# Patient Record
Sex: Female | Born: 1960 | Race: White | Hispanic: No | Smoking: Never smoker
Health system: Southern US, Community
[De-identification: ages and names within clinical notes are randomized; demographics above are authoritative.]

## PROBLEM LIST (undated history)

## (undated) DIAGNOSIS — Z800 Family history of malignant neoplasm of digestive organs: Secondary | ICD-10-CM

## (undated) DIAGNOSIS — K5732 Diverticulitis of large intestine without perforation or abscess without bleeding: Secondary | ICD-10-CM

## (undated) DIAGNOSIS — Z803 Family history of malignant neoplasm of breast: Secondary | ICD-10-CM

## (undated) DIAGNOSIS — Z808 Family history of malignant neoplasm of other organs or systems: Secondary | ICD-10-CM

## (undated) DIAGNOSIS — T7840XA Allergy, unspecified, initial encounter: Secondary | ICD-10-CM

## (undated) DIAGNOSIS — I10 Essential (primary) hypertension: Secondary | ICD-10-CM

## (undated) DIAGNOSIS — E785 Hyperlipidemia, unspecified: Secondary | ICD-10-CM

## (undated) HISTORY — DX: Hyperlipidemia, unspecified: E78.5

## (undated) HISTORY — DX: Family history of malignant neoplasm of digestive organs: Z80.0

## (undated) HISTORY — DX: Family history of malignant neoplasm of other organs or systems: Z80.8

## (undated) HISTORY — DX: Allergy, unspecified, initial encounter: T78.40XA

## (undated) HISTORY — DX: Family history of malignant neoplasm of breast: Z80.3

## (undated) HISTORY — PX: BREAST SURGERY: SHX581

## (undated) HISTORY — DX: Essential (primary) hypertension: I10

---
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
## 1998-04-26 HISTORY — PX: BREAST SURGERY: SHX581

## 1999-04-27 HISTORY — PX: BREAST BIOPSY: SHX20

## 1999-04-27 HISTORY — PX: BREAST CYST ASPIRATION: SHX578

## 2003-04-27 HISTORY — PX: ABDOMINAL HYSTERECTOMY: SHX81

## 2004-09-03 ENCOUNTER — Ambulatory Visit: Payer: Self-pay | Admitting: Unknown Physician Specialty

## 2005-10-11 ENCOUNTER — Ambulatory Visit: Payer: Self-pay | Admitting: Unknown Physician Specialty

## 2006-10-26 ENCOUNTER — Ambulatory Visit: Payer: Self-pay | Admitting: Unknown Physician Specialty

## 2007-11-13 ENCOUNTER — Ambulatory Visit: Payer: Self-pay | Admitting: Unknown Physician Specialty

## 2008-11-18 ENCOUNTER — Ambulatory Visit: Payer: Self-pay | Admitting: Unknown Physician Specialty

## 2009-02-10 ENCOUNTER — Ambulatory Visit: Payer: Self-pay | Admitting: Internal Medicine

## 2009-11-19 ENCOUNTER — Ambulatory Visit: Payer: Self-pay | Admitting: Unknown Physician Specialty

## 2010-12-07 ENCOUNTER — Ambulatory Visit: Payer: Self-pay | Admitting: Unknown Physician Specialty

## 2011-03-16 ENCOUNTER — Encounter: Payer: Self-pay | Admitting: Internal Medicine

## 2011-03-16 ENCOUNTER — Ambulatory Visit (INDEPENDENT_AMBULATORY_CARE_PROVIDER_SITE_OTHER): Payer: BC Managed Care – PPO | Admitting: Internal Medicine

## 2011-03-16 VITALS — BP 118/72 | HR 87 | Temp 98.5°F | Resp 16 | Ht 62.75 in | Wt 180.5 lb

## 2011-03-16 DIAGNOSIS — R0602 Shortness of breath: Secondary | ICD-10-CM

## 2011-03-16 DIAGNOSIS — J189 Pneumonia, unspecified organism: Secondary | ICD-10-CM

## 2011-03-16 DIAGNOSIS — R05 Cough: Secondary | ICD-10-CM

## 2011-03-16 DIAGNOSIS — R5383 Other fatigue: Secondary | ICD-10-CM

## 2011-03-16 MED ORDER — GUAIFENESIN-CODEINE 100-10 MG/5ML PO SYRP: 5.0000 mL | ORAL_SOLUTION | Freq: Four times a day (QID) | ORAL | Status: DC | PRN

## 2011-03-16 MED ORDER — LEVOFLOXACIN 750 MG PO TABS: 750.0000 mg | ORAL_TABLET | Freq: Every day | ORAL | Status: AC

## 2011-03-16 NOTE — Progress Notes (Signed)
Subjective:    Patient ID: Nichole Jensen, female    DOB: [birth year], 50 y.o.   MRN: [id number]  HPI 50YO female with HTN presents for acute visit complaining of 1 month history of cough and nasal congestion presents for acute visit. Reports initially had fever, chills, malaise, nasal congestion. Developed progressively worsening cough, productive of green sputum. Has been using OTC cough/cold medications and left over codeine from episode of bronchitis last year with no improvement. Reports some chest tightness and mild dyspnea. No wheezing. No recurrent fever, chills. Complains of right ear pain.  Outpatient Encounter Prescriptions as of 03/16/2011  Medication Sig Dispense Refill  . albuterol (PROAIR HFA) 108 (90 BASE) MCG/ACT inhaler Inhale 1 puff into the lungs every 4 (four) hours as needed.        Marland Kitchen aspirin 81 MG tablet Take 81 mg by mouth daily.        Marland Kitchen b complex vitamins tablet Take 1 tablet by mouth daily.        Marland Kitchen guaiFENesin-codeine (ROBITUSSIN AC) 100-10 MG/5ML syrup Take 5 mLs by mouth 4 (four) times daily as needed.  240 mL  0  . Misc Natural Products (OSTEO BI-FLEX JOINT SHIELD) TABS Take 1 each by mouth daily.        . Multiple Vitamin (MULTIVITAMIN) tablet Take 1 tablet by mouth daily.        Marland Kitchen DISCONTD: guaiFENesin-codeine (ROBITUSSIN AC) 100-10 MG/5ML syrup Take 5 mLs by mouth 3 (three) times daily as needed.        Marland Kitchen levofloxacin (LEVAQUIN) 750 MG tablet Take 1 tablet (750 mg total) by mouth daily.  7 tablet  0     Review of Systems  Constitutional: Positive for fever and chills. Negative for unexpected weight change.  HENT: Positive for congestion, sore throat, rhinorrhea, postnasal drip and sinus pressure. Negative for hearing loss, ear pain, nosebleeds, facial swelling, sneezing, mouth sores, trouble swallowing, neck pain, neck stiffness, voice change, tinnitus and ear discharge.   Eyes: Negative for pain, discharge, redness and visual disturbance.  Respiratory:  Positive for cough, chest tightness and shortness of breath. Negative for wheezing and stridor.   Cardiovascular: Negative for chest pain, palpitations and leg swelling.  Musculoskeletal: Negative for myalgias and arthralgias.  Skin: Negative for color change and rash.  Neurological: Negative for dizziness, weakness, light-headedness and headaches.  Hematological: Negative for adenopathy.   BP 118/72  Pulse 87  Temp(Src) 98.5 F (36.9 C) (Oral)  Resp 16  Ht 5' 2.75" (1.594 m)  Wt 180 lb 8 oz (81.874 kg)  BMI 32.23 kg/m2  SpO2 97%     Objective:   Physical Exam  Constitutional: She is oriented to person, place, and time. She appears well-developed and well-nourished. No distress.  HENT:  Head: Normocephalic and atraumatic.  Right Ear: External ear normal.  Left Ear: External ear normal.  Nose: Nose normal.  Mouth/Throat: Oropharynx is clear and moist. No oropharyngeal exudate.  Eyes: Conjunctivae are normal. Pupils are equal, round, and reactive to light. Right eye exhibits no discharge. Left eye exhibits no discharge. No scleral icterus.  Neck: Normal range of motion. Neck supple. No tracheal deviation present. No thyromegaly present.  Cardiovascular: Normal rate, regular rhythm, normal heart sounds and intact distal pulses.  Exam reveals no gallop and no friction rub.   No murmur heard. Pulmonary/Chest: Effort normal. No accessory muscle usage. Not tachypneic. No respiratory distress. She has decreased breath sounds in the right middle field and the right lower  field. She has no wheezes. She has rhonchi in the right middle field and the right lower field. She has no rales. She exhibits no tenderness.  Musculoskeletal: Normal range of motion. She exhibits no edema and no tenderness.  Lymphadenopathy:    She has no cervical adenopathy.  Neurological: She is alert and oriented to person, place, and time. No cranial nerve deficit. She exhibits normal muscle tone. Coordination normal.    Skin: Skin is warm and dry. No rash noted. She is not diaphoretic. No erythema. No pallor.  Psychiatric: She has a normal mood and affect. Her behavior is normal. Judgment and thought content normal.          Assessment & Plan:  1. Pneumonia - Symptoms and exam consistent with RLL pneumonia. Will treat with Levaquin and use codeine for cough. Pt will call if symptoms not improving over next 72hr. If no improvement, will get CXR.

## 2011-04-09 LAB — HM PAP SMEAR: HM Pap smear: NORMAL

## 2011-04-22 ENCOUNTER — Ambulatory Visit (INDEPENDENT_AMBULATORY_CARE_PROVIDER_SITE_OTHER): Payer: BC Managed Care – PPO | Admitting: Internal Medicine

## 2011-04-22 ENCOUNTER — Encounter: Payer: Self-pay | Admitting: Internal Medicine

## 2011-04-22 VITALS — BP 138/80 | HR 68 | Temp 97.7°F | Wt 182.0 lb

## 2011-04-22 DIAGNOSIS — J302 Other seasonal allergic rhinitis: Secondary | ICD-10-CM

## 2011-04-22 DIAGNOSIS — J309 Allergic rhinitis, unspecified: Secondary | ICD-10-CM

## 2011-04-22 DIAGNOSIS — I10 Essential (primary) hypertension: Secondary | ICD-10-CM | POA: Insufficient documentation

## 2011-04-22 NOTE — Progress Notes (Signed)
Subjective:    Patient ID: Nichole Jensen, female    DOB: 1960-11-01, 50 y.o.   MRN: [id number]  HPI 50 year old female with a history of hypertension and a recent episode of pneumonia presents for followup. She reports that it took several weeks for her to recover from her pneumonia. She reports some fatigue which was persistent for several weeks. She notes that her symptoms have markedly improved, however. She reports that she no longer has cough, shortness of breath, or fever. She does occasionally have clear nasal drainage, sneezing, and watery eyes which are consistent with her known history of seasonal allergies. She has been using Benadryl for this at night and Zyrtec during the day.  In regards to her history of hypertension, she is not currently taking any medications. She did not bring a record of her blood pressure today. She denies any headache, chest pain, palpitations, or other symptoms.  Outpatient Encounter Prescriptions as of 04/22/2011  Medication Sig Dispense Refill  . aspirin 81 MG tablet Take 81 mg by mouth daily.        Marland Kitchen b complex vitamins tablet Take 1 tablet by mouth daily.        . Misc Natural Products (OSTEO BI-FLEX JOINT SHIELD) TABS Take 1 each by mouth daily.        . Multiple Vitamin (MULTIVITAMIN) tablet Take 1 tablet by mouth daily.        Marland Kitchen DISCONTD: guaiFENesin-codeine (ROBITUSSIN AC) 100-10 MG/5ML syrup Take 5 mLs by mouth 4 (four) times daily as needed.  240 mL  0    Review of Systems  Constitutional: Negative for fever, chills, appetite change, fatigue and unexpected weight change.  HENT: Positive for congestion, rhinorrhea, sneezing, voice change and postnasal drip. Negative for ear pain, sore throat, trouble swallowing, neck pain and sinus pressure.   Eyes: Negative for visual disturbance.  Respiratory: Negative for cough, shortness of breath, wheezing and stridor.   Cardiovascular: Negative for chest pain, palpitations and leg swelling.    Gastrointestinal: Negative for nausea, vomiting, abdominal pain, diarrhea, constipation, blood in stool, abdominal distention and anal bleeding.  Genitourinary: Negative for dysuria and flank pain.  Musculoskeletal: Negative for myalgias, arthralgias and gait problem.  Skin: Negative for color change and rash.  Neurological: Negative for dizziness and headaches.  Hematological: Negative for adenopathy. Does not bruise/bleed easily.  Psychiatric/Behavioral: Negative for suicidal ideas, sleep disturbance and dysphoric mood. The patient is not nervous/anxious.    BP 138/80  Pulse 68  Temp(Src) 97.7 F (36.5 C) (Oral)  Wt 182 lb (82.555 kg)  SpO2 97%     Objective:   Physical Exam  Constitutional: She is oriented to person, place, and time. She appears well-developed and well-nourished. No distress.  HENT:  Head: Normocephalic and atraumatic.  Right Ear: External ear normal.  Left Ear: External ear normal.  Nose: Nose normal.  Mouth/Throat: Oropharynx is clear and moist. No oropharyngeal exudate.  Eyes: Conjunctivae are normal. Pupils are equal, round, and reactive to light. Right eye exhibits no discharge. Left eye exhibits no discharge. No scleral icterus.  Neck: Normal range of motion. Neck supple. No tracheal deviation present. No thyromegaly present.  Cardiovascular: Normal rate, regular rhythm, normal heart sounds and intact distal pulses.  Exam reveals no gallop and no friction rub.   No murmur heard. Pulmonary/Chest: Effort normal and breath sounds normal. No respiratory distress. She has no wheezes. She has no rales. She exhibits no tenderness.  Musculoskeletal: Normal range of motion. She exhibits  no edema and no tenderness.  Lymphadenopathy:    She has no cervical adenopathy.  Neurological: She is alert and oriented to person, place, and time. No cranial nerve deficit. She exhibits normal muscle tone. Coordination normal.  Skin: Skin is warm and dry. No rash noted. She is  not diaphoretic. No erythema. No pallor.  Psychiatric: She has a normal mood and affect. Her behavior is normal. Judgment and thought content normal.          Assessment & Plan:  1. Hypertension -blood pressure is fairly well-controlled today. Will check renal function with fasting labs next week. She will followup in 6 months.  2. Seasonal allergies - symptoms are fairly well controlled with the use of Zyrtec and Benadryl. We'll continue these for now. Patient will call if her symptoms are persistent or worsening.

## 2011-04-26 ENCOUNTER — Other Ambulatory Visit (INDEPENDENT_AMBULATORY_CARE_PROVIDER_SITE_OTHER): Payer: BC Managed Care – PPO | Admitting: *Deleted

## 2011-04-26 DIAGNOSIS — Z0000 Encounter for general adult medical examination without abnormal findings: Secondary | ICD-10-CM

## 2011-04-26 LAB — COMPREHENSIVE METABOLIC PANEL
ALT: 27 U/L (ref 0–35)
AST: 25 U/L (ref 0–37)
Albumin: 4.5 g/dL (ref 3.5–5.2)
Alkaline Phosphatase: 82 U/L (ref 39–117)
BUN: 23 mg/dL (ref 6–23)
CO2: 22 mEq/L (ref 19–32)
Calcium: 9.3 mg/dL (ref 8.4–10.5)
Chloride: 105 mEq/L (ref 96–112)
Creatinine, Ser: 0.8 mg/dL (ref 0.4–1.2)
GFR: 81.64 mL/min (ref >60.00)
Glucose, Bld: 108 mg/dL — ABNORMAL HIGH (ref 70–99)
Potassium: 4.8 mEq/L (ref 3.5–5.1)
Sodium: 137 mEq/L (ref 135–145)
Total Bilirubin: 0.4 mg/dL (ref 0.3–1.2)
Total Protein: 7 g/dL (ref 6.0–8.3)

## 2011-04-26 LAB — LIPID PANEL
Cholesterol: 193 mg/dL (ref 0–200)
HDL: 44.8 mg/dL (ref >39.00)
LDL Cholesterol: 124 mg/dL — ABNORMAL HIGH (ref 0–99)
Total CHOL/HDL Ratio: 4
Triglycerides: 123 mg/dL (ref 0.0–149.0)
VLDL: 24.6 mg/dL (ref 0.0–40.0)

## 2011-04-26 LAB — CBC WITH DIFFERENTIAL/PLATELET
Basophils Relative: 0.2 % (ref 0.0–3.0)
Eosinophils Relative: 0.6 % (ref 0.0–5.0)
MCV: 85.9 fl (ref 78.0–100.0)
Monocytes Absolute: 0.4 10*3/uL (ref 0.1–1.0)
Neutrophils Relative %: 63.4 % (ref 43.0–77.0)
RBC: 4.74 Mil/uL (ref 3.87–5.11)
WBC: 6.9 10*3/uL (ref 4.5–10.5)

## 2011-04-26 LAB — HEMOGLOBIN A1C: Hgb A1c MFr Bld: 6.1 % (ref 4.6–6.5)

## 2011-06-02 ENCOUNTER — Encounter: Payer: Self-pay | Admitting: Internal Medicine

## 2011-09-14 ENCOUNTER — Telehealth: Payer: Self-pay | Admitting: Internal Medicine

## 2011-09-14 MED ORDER — HYDROCHLOROTHIAZIDE 12.5 MG PO TABS: 12.5000 mg | ORAL_TABLET | Freq: Every day | ORAL | Status: DC

## 2011-09-14 NOTE — Telephone Encounter (Signed)
Caller: Nichole Jensen/Patient; PCP: Ronna Polio; CB#: [phone]; Call regarding Blood Pressure;  Not sleeping well, Working 14-16 hours per day.  Increased caffeine and not feeling well.  She reports she has 6 HCTZ left and one refill.  Asks if she should resume them.  BP around 140/80 when checked by school nurse.  She reports weight gain of est 7 lbs since medication was discontinued.  Advised see provider in 72 hours.  Caller states plan to follow up with office regarding that, but asks if she can resume HCTZ in the interim.  Hypertension, Diagnosed or Suspected protocol used.

## 2011-09-14 NOTE — Telephone Encounter (Signed)
Rx sent to pharmacy; patient informed, will call back to schedule follow-up/SLS

## 2011-09-14 NOTE — Telephone Encounter (Signed)
Fine to resume HCTZ and we can call in refill. Needs follow up within 1 month.

## 2011-12-08 ENCOUNTER — Telehealth: Payer: Self-pay | Admitting: Internal Medicine

## 2011-12-08 ENCOUNTER — Ambulatory Visit (INDEPENDENT_AMBULATORY_CARE_PROVIDER_SITE_OTHER): Payer: BC Managed Care – PPO | Admitting: Internal Medicine

## 2011-12-08 ENCOUNTER — Encounter: Payer: Self-pay | Admitting: Internal Medicine

## 2011-12-08 VITALS — BP 124/82 | HR 68 | Temp 98.5°F | Ht 62.75 in | Wt 181.5 lb

## 2011-12-08 DIAGNOSIS — M542 Cervicalgia: Secondary | ICD-10-CM

## 2011-12-08 DIAGNOSIS — Z1211 Encounter for screening for malignant neoplasm of colon: Secondary | ICD-10-CM

## 2011-12-08 DIAGNOSIS — Z1239 Encounter for other screening for malignant neoplasm of breast: Secondary | ICD-10-CM

## 2011-12-08 DIAGNOSIS — I10 Essential (primary) hypertension: Secondary | ICD-10-CM

## 2011-12-08 LAB — HM MAMMOGRAPHY

## 2011-12-08 MED ORDER — HYDROCHLOROTHIAZIDE 12.5 MG PO TABS: 12.5000 mg | ORAL_TABLET | Freq: Every day | ORAL | Status: DC

## 2011-12-08 NOTE — Progress Notes (Signed)
Subjective:    Patient ID: Nichole Jensen, female    DOB: 18-Jan-1961, 51 y.o.   MRN: [id number]  HPI 51 year old female with history of hypertension presents for followup. She reports she is doing well. She recently changed jobs and is now working in a less stressful environment. She reports she has started to exercise by walking again. Her goal is to lose weight. She denies any headache, chest pain, palpitations. She reports normal energy level. Her only other concern today is some tenderness over her left neck. She notes that when she sleeps at night her hand is often pressing in this location. She denies any trouble swallowing, nasal drainage, fever, chills. She has not noted any swelling in this area.  Outpatient Encounter Prescriptions as of 12/08/2011  Medication Sig Dispense Refill  . aspirin 81 MG tablet Take 81 mg by mouth daily.        Marland Kitchen b complex vitamins tablet Take 1 tablet by mouth daily.        . hydrochlorothiazide (HYDRODIURIL) 12.5 MG tablet Take 1 tablet (12.5 mg total) by mouth daily.  90 tablet  3  . Misc Natural Products (OSTEO BI-FLEX JOINT SHIELD) TABS Take 1 each by mouth daily.        . Multiple Vitamin (MULTIVITAMIN) tablet Take 1 tablet by mouth daily.        Marland Kitchen DISCONTD: hydrochlorothiazide (HYDRODIURIL) 12.5 MG tablet Take 1 tablet (12.5 mg total) by mouth daily.  30 tablet  3   BP 124/82  Pulse 68  Temp 98.5 F (36.9 C) (Oral)  Ht 5' 2.75" (1.594 m)  Wt 181 lb 8 oz (82.328 kg)  BMI 32.41 kg/m2  SpO2 97%  Review of Systems  Constitutional: Negative for fever, chills, appetite change, fatigue and unexpected weight change.  HENT: Positive for neck pain. Negative for ear pain, congestion, sore throat, trouble swallowing, voice change and sinus pressure.   Eyes: Negative for visual disturbance.  Respiratory: Negative for cough, shortness of breath, wheezing and stridor.   Cardiovascular: Negative for chest pain, palpitations and leg swelling.    Gastrointestinal: Negative for nausea, vomiting, abdominal pain, diarrhea, constipation, blood in stool, abdominal distention and anal bleeding.  Genitourinary: Negative for dysuria and flank pain.  Musculoskeletal: Negative for myalgias, arthralgias and gait problem.  Skin: Negative for color change and rash.  Neurological: Negative for dizziness and headaches.  Hematological: Negative for adenopathy. Does not bruise/bleed easily.  Psychiatric/Behavioral: Negative for suicidal ideas, disturbed wake/sleep cycle and dysphoric mood. The patient is not nervous/anxious.        Objective:   Physical Exam  Constitutional: She is oriented to person, place, and time. She appears well-developed and well-nourished. No distress.  HENT:  Head: Normocephalic and atraumatic.  Right Ear: External ear normal.  Left Ear: External ear normal.  Nose: Nose normal.  Mouth/Throat: Oropharynx is clear and moist. No oropharyngeal exudate.  Eyes: Conjunctivae are normal. Pupils are equal, round, and reactive to light. Right eye exhibits no discharge. Left eye exhibits no discharge. No scleral icterus.  Neck: Normal range of motion. Neck supple. No tracheal deviation present. No thyromegaly present.    Cardiovascular: Normal rate, regular rhythm, normal heart sounds and intact distal pulses.  Exam reveals no gallop and no friction rub.   No murmur heard. Pulmonary/Chest: Effort normal and breath sounds normal. No respiratory distress. She has no wheezes. She has no rales. She exhibits no tenderness.  Musculoskeletal: Normal range of motion. She exhibits no edema and  no tenderness.  Lymphadenopathy:    She has no cervical adenopathy.  Neurological: She is alert and oriented to person, place, and time. No cranial nerve deficit. She exhibits normal muscle tone. Coordination normal.  Skin: Skin is warm and dry. No rash noted. She is not diaphoretic. No erythema. No pallor.  Psychiatric: She has a normal mood and  affect. Her behavior is normal. Judgment and thought content normal.          Assessment & Plan:

## 2011-12-08 NOTE — Telephone Encounter (Signed)
Patient has a mammogram appt scheduled on 8.22.13 @ 7:45 with [facility]. Can you put a referral order in please.

## 2011-12-08 NOTE — Assessment & Plan Note (Signed)
Suspect secondary to positioning when sleeping. Exam is normal today. Patient will call if symptoms are persistent.

## 2011-12-08 NOTE — Telephone Encounter (Signed)
Order for screening mammogram placed, order given to [facility].

## 2011-12-08 NOTE — Assessment & Plan Note (Signed)
Blood pressure well-controlled on hydrochlorothiazide. Will continue. Will check renal function with labs today. Followup 6 months.

## 2011-12-08 NOTE — Assessment & Plan Note (Signed)
Screening colonoscopy ordered today 

## 2011-12-16 ENCOUNTER — Ambulatory Visit: Payer: Self-pay | Admitting: Internal Medicine

## 2011-12-16 LAB — COMPREHENSIVE METABOLIC PANEL
ALT: 23 U/L (ref 0–35)
BUN: 28 mg/dL — ABNORMAL HIGH (ref 6–23)
CO2: 28 mEq/L (ref 19–32)
Creatinine, Ser: 1 mg/dL (ref 0.4–1.2)
GFR: 65.82 mL/min (ref >60.00)
Total Bilirubin: 0.4 mg/dL (ref 0.3–1.2)

## 2011-12-16 LAB — TSH: TSH: 1.34 u[IU]/mL (ref 0.35–5.50)

## 2011-12-24 ENCOUNTER — Encounter: Payer: Self-pay | Admitting: Internal Medicine

## 2011-12-24 LAB — HM MAMMOGRAPHY: HM Mammogram: NORMAL

## 2012-01-21 ENCOUNTER — Ambulatory Visit: Payer: BC Managed Care – PPO

## 2012-02-18 ENCOUNTER — Encounter: Payer: Self-pay | Admitting: Internal Medicine

## 2012-03-27 ENCOUNTER — Encounter: Payer: Self-pay | Admitting: Internal Medicine

## 2012-03-27 ENCOUNTER — Ambulatory Visit (AMBULATORY_SURGERY_CENTER): Payer: BC Managed Care – PPO | Admitting: *Deleted

## 2012-03-27 VITALS — Ht 63.0 in | Wt 183.6 lb

## 2012-03-27 DIAGNOSIS — Z1211 Encounter for screening for malignant neoplasm of colon: Secondary | ICD-10-CM

## 2012-03-27 MED ORDER — MOVIPREP 100 G PO SOLR: ORAL | Status: DC

## 2012-04-10 ENCOUNTER — Encounter: Payer: BC Managed Care – PPO | Admitting: Internal Medicine

## 2012-04-17 ENCOUNTER — Encounter: Payer: BC Managed Care – PPO | Admitting: Internal Medicine

## 2012-06-10 ENCOUNTER — Other Ambulatory Visit: Payer: Self-pay

## 2012-08-08 ENCOUNTER — Encounter: Payer: Self-pay | Admitting: Internal Medicine

## 2012-08-08 ENCOUNTER — Ambulatory Visit (INDEPENDENT_AMBULATORY_CARE_PROVIDER_SITE_OTHER): Payer: BC Managed Care – PPO | Admitting: Internal Medicine

## 2012-08-08 VITALS — BP 140/100 | HR 80 | Temp 98.2°F | Wt 186.0 lb

## 2012-08-08 DIAGNOSIS — Z1211 Encounter for screening for malignant neoplasm of colon: Secondary | ICD-10-CM

## 2012-08-08 DIAGNOSIS — I10 Essential (primary) hypertension: Secondary | ICD-10-CM

## 2012-08-08 LAB — MICROALBUMIN / CREATININE URINE RATIO
Microalb Creat Ratio: 1.5 mg/g (ref 0.0–30.0)
Microalb, Ur: 0.6 mg/dL (ref 0.0–1.9)

## 2012-08-08 MED ORDER — HYDROCHLOROTHIAZIDE 12.5 MG PO TABS: 12.5000 mg | ORAL_TABLET | Freq: Every day | ORAL | Status: DC

## 2012-08-08 NOTE — Progress Notes (Signed)
Subjective:    Patient ID: Nichole Jensen, female    DOB: June 02, 1960, 52 y.o.   MRN: [id number]  HPI 52 year old female with history of hypertension presents for followup. She notes over the last few months, she has had some dietary indiscretion resulting in weight gain of about 20 pounds. She notes that blood pressure has been more elevated than in previous months, typically near 140/100. She denies any headache, chest pain, palpitations. Aside from this, she reports she is feeling well. Things are going well both at home and at work. No new concerns today.  Outpatient Encounter Prescriptions as of 08/08/2012  Medication Sig Dispense Refill  . aspirin 81 MG tablet Take 81 mg by mouth daily.        Marland Kitchen b complex vitamins tablet Take 1 tablet by mouth daily.        . hydrochlorothiazide (HYDRODIURIL) 12.5 MG tablet Take 1 tablet (12.5 mg total) by mouth daily.  90 tablet  3  . Misc Natural Products (OSTEO BI-FLEX JOINT SHIELD) TABS Take 1 each by mouth daily.        . Multiple Vitamin (MULTIVITAMIN) tablet Take 1 tablet by mouth daily.         No facility-administered encounter medications on file as of 08/08/2012.   BP 140/100  Pulse 80  Temp(Src) 98.2 F (36.8 C) (Oral)  Wt 186 lb (84.369 kg)  BMI 32.96 kg/m2  SpO2 96%  Review of Systems  Constitutional: Negative for fever, chills, appetite change, fatigue and unexpected weight change.  HENT: Negative for ear pain, congestion, sore throat, trouble swallowing, neck pain, voice change and sinus pressure.   Eyes: Negative for visual disturbance.  Respiratory: Negative for cough, shortness of breath, wheezing and stridor.   Cardiovascular: Negative for chest pain, palpitations and leg swelling.  Gastrointestinal: Negative for nausea, vomiting, abdominal pain, diarrhea, constipation, blood in stool, abdominal distention and anal bleeding.  Genitourinary: Negative for dysuria and flank pain.  Musculoskeletal: Negative for myalgias,  arthralgias and gait problem.  Skin: Negative for color change and rash.  Neurological: Negative for dizziness and headaches.  Hematological: Negative for adenopathy. Does not bruise/bleed easily.  Psychiatric/Behavioral: Negative for suicidal ideas, sleep disturbance and dysphoric mood. The patient is not nervous/anxious.        Objective:   Physical Exam  Constitutional: She is oriented to person, place, and time. She appears well-developed and well-nourished. No distress.  HENT:  Head: Normocephalic and atraumatic.  Right Ear: External ear normal.  Left Ear: External ear normal.  Nose: Nose normal.  Mouth/Throat: Oropharynx is clear and moist. No oropharyngeal exudate.  Eyes: Conjunctivae are normal. Pupils are equal, round, and reactive to light. Right eye exhibits no discharge. Left eye exhibits no discharge. No scleral icterus.  Neck: Normal range of motion. Neck supple. No tracheal deviation present. No thyromegaly present.  Cardiovascular: Normal rate, regular rhythm, normal heart sounds and intact distal pulses.  Exam reveals no gallop and no friction rub.   No murmur heard. Pulmonary/Chest: Effort normal and breath sounds normal. No respiratory distress. She has no wheezes. She has no rales. She exhibits no tenderness.  Musculoskeletal: Normal range of motion. She exhibits no edema and no tenderness.  Lymphadenopathy:    She has no cervical adenopathy.  Neurological: She is alert and oriented to person, place, and time. No cranial nerve deficit. She exhibits normal muscle tone. Coordination normal.  Skin: Skin is warm and dry. No rash noted. She is not diaphoretic. No erythema.  No pallor.  Psychiatric: She has a normal mood and affect. Her behavior is normal. Judgment and thought content normal.          Assessment & Plan:

## 2012-08-08 NOTE — Assessment & Plan Note (Signed)
BP Readings from Last 3 Encounters:  08/08/12 140/100  12/08/11 124/82  04/22/11 138/80   Blood pressure is elevated today. We discussed options of adding lisinopril to her regimen versus making concerted effort at improving diet and increasing physical activity for weight loss. She would prefer to give diet and exercise try over the next month with followup in one month to recheck her blood pressure. Will check renal function with labs today. Continue hydrochlorothiazide.

## 2012-08-09 ENCOUNTER — Encounter: Payer: Self-pay | Admitting: Internal Medicine

## 2012-08-09 LAB — COMPREHENSIVE METABOLIC PANEL
ALT: 28 U/L (ref 0–35)
Albumin: 4.5 g/dL (ref 3.5–5.2)
Alkaline Phosphatase: 87 U/L (ref 39–117)
Glucose, Bld: 78 mg/dL (ref 70–99)
Potassium: 3.9 mEq/L (ref 3.5–5.1)
Sodium: 138 mEq/L (ref 135–145)
Total Protein: 7.2 g/dL (ref 6.0–8.3)

## 2012-08-09 LAB — LIPID PANEL
Cholesterol: 189 mg/dL (ref 0–200)
Total CHOL/HDL Ratio: 5

## 2012-08-09 LAB — LDL CHOLESTEROL, DIRECT: Direct LDL: 94.2 mg/dL

## 2012-08-14 ENCOUNTER — Encounter: Payer: Self-pay | Admitting: Emergency Medicine

## 2012-08-14 ENCOUNTER — Encounter: Payer: Self-pay | Admitting: *Deleted

## 2012-09-20 ENCOUNTER — Encounter: Payer: BC Managed Care – PPO | Admitting: Internal Medicine

## 2012-09-22 ENCOUNTER — Encounter: Payer: Self-pay | Admitting: Internal Medicine

## 2012-09-22 ENCOUNTER — Ambulatory Visit (INDEPENDENT_AMBULATORY_CARE_PROVIDER_SITE_OTHER): Payer: BC Managed Care – PPO | Admitting: Internal Medicine

## 2012-09-22 VITALS — BP 126/88 | HR 68 | Temp 98.5°F | Wt 187.0 lb

## 2012-09-22 DIAGNOSIS — Z23 Encounter for immunization: Secondary | ICD-10-CM

## 2012-09-22 DIAGNOSIS — E669 Obesity, unspecified: Secondary | ICD-10-CM | POA: Insufficient documentation

## 2012-09-22 DIAGNOSIS — I10 Essential (primary) hypertension: Secondary | ICD-10-CM

## 2012-09-22 LAB — HM COLONOSCOPY

## 2012-09-22 NOTE — Assessment & Plan Note (Signed)
BP Readings from Last 3 Encounters:  09/22/12 126/88  08/08/12 140/100  12/08/11 124/82   BP much improved with changes in diet and exercise and use of HCTZ. Will continue HCTZ. Follow up 6 months and prn.

## 2012-09-22 NOTE — Assessment & Plan Note (Signed)
Wt Readings from Last 3 Encounters:  09/22/12 187 lb (84.823 kg)  08/08/12 186 lb (84.369 kg)  03/27/12 183 lb 9.6 oz (83.28 kg)   Body mass index is 33.13 kg/(m^2).  Encouraged healthy diet and keeping a food diary. Encouraged regular exercise with goal of 3x per week.

## 2012-09-22 NOTE — Progress Notes (Signed)
Subjective:    Patient ID: Nichole Jensen, female    DOB: [birth year], 52 y.o.   MRN: [id number]  HPI 52YO female with h/o HTN presents for follow up. Feeling well. Compliant with medications. Struggling to lose weight. Walking on a regular basis. Not following any particular diet, however generally trying to eat healthy.  Outpatient Encounter Prescriptions as of 09/22/2012  Medication Sig Dispense Refill  . aspirin 81 MG tablet Take 81 mg by mouth daily.        Marland Kitchen b complex vitamins tablet Take 1 tablet by mouth daily.        . hydrochlorothiazide (HYDRODIURIL) 12.5 MG tablet Take 1 tablet (12.5 mg total) by mouth daily.  90 tablet  3  . Misc Natural Products (OSTEO BI-FLEX JOINT SHIELD) TABS Take 1 each by mouth daily.        . Multiple Vitamin (MULTIVITAMIN) tablet Take 1 tablet by mouth daily.         No facility-administered encounter medications on file as of 09/22/2012.   BP 126/88  Pulse 68  Temp(Src) 98.5 F (36.9 C) (Oral)  Wt 187 lb (84.823 kg)  BMI 33.13 kg/m2  SpO2 94%  Review of Systems  Constitutional: Negative for fever, chills, appetite change, fatigue and unexpected weight change.  HENT: Negative for ear pain, congestion, sore throat, trouble swallowing, neck pain, voice change and sinus pressure.   Eyes: Negative for visual disturbance.  Respiratory: Negative for cough, shortness of breath, wheezing and stridor.   Cardiovascular: Negative for chest pain, palpitations and leg swelling.  Gastrointestinal: Negative for nausea, vomiting, abdominal pain, diarrhea, constipation, blood in stool, abdominal distention and anal bleeding.  Genitourinary: Negative for dysuria and flank pain.  Musculoskeletal: Negative for myalgias, arthralgias and gait problem.  Skin: Negative for color change and rash.  Neurological: Negative for dizziness and headaches.  Hematological: Negative for adenopathy. Does not bruise/bleed easily.  Psychiatric/Behavioral: Negative for suicidal  ideas, sleep disturbance and dysphoric mood. The patient is not nervous/anxious.        Objective:   Physical Exam  Constitutional: She is oriented to person, place, and time. She appears well-developed and well-nourished. No distress.  HENT:  Head: Normocephalic and atraumatic.  Right Ear: External ear normal.  Left Ear: External ear normal.  Nose: Nose normal.  Mouth/Throat: Oropharynx is clear and moist. No oropharyngeal exudate.  Eyes: Conjunctivae are normal. Pupils are equal, round, and reactive to light. Right eye exhibits no discharge. Left eye exhibits no discharge. No scleral icterus.  Neck: Normal range of motion. Neck supple. No tracheal deviation present. No thyromegaly present.  Cardiovascular: Normal rate, regular rhythm, normal heart sounds and intact distal pulses.  Exam reveals no gallop and no friction rub.   No murmur heard. Pulmonary/Chest: Effort normal and breath sounds normal. No accessory muscle usage. Not tachypneic. No respiratory distress. She has no decreased breath sounds. She has no wheezes. She has no rhonchi. She has no rales. She exhibits no tenderness.  Musculoskeletal: Normal range of motion. She exhibits no edema and no tenderness.  Lymphadenopathy:    She has no cervical adenopathy.  Neurological: She is alert and oriented to person, place, and time. No cranial nerve deficit. She exhibits normal muscle tone. Coordination normal.  Skin: Skin is warm and dry. No rash noted. She is not diaphoretic. No erythema. No pallor.  Psychiatric: She has a normal mood and affect. Her behavior is normal. Judgment and thought content normal.  Assessment & Plan:

## 2012-10-01 LAB — HM COLONOSCOPY

## 2012-10-03 ENCOUNTER — Telehealth: Payer: Self-pay | Admitting: *Deleted

## 2012-10-03 NOTE — Telephone Encounter (Signed)
Patient no show for previsit today. Message left on cell phone to reschedule previsit before 5pm today or colonoscopy scheduled for 10/20/12 will be cancelled. Message left if cancelled, she will need to call back to reschedule colonoscopy and previsit.

## 2012-10-13 ENCOUNTER — Encounter: Payer: Self-pay | Admitting: Internal Medicine

## 2012-10-13 ENCOUNTER — Ambulatory Visit (AMBULATORY_SURGERY_CENTER): Payer: BC Managed Care – PPO | Admitting: *Deleted

## 2012-10-13 VITALS — Ht 63.0 in | Wt 191.0 lb

## 2012-10-13 DIAGNOSIS — Z1211 Encounter for screening for malignant neoplasm of colon: Secondary | ICD-10-CM

## 2012-10-13 MED ORDER — MOVIPREP 100 G PO SOLR: ORAL | Status: DC

## 2012-10-20 ENCOUNTER — Encounter: Payer: Self-pay | Admitting: Internal Medicine

## 2012-10-20 ENCOUNTER — Ambulatory Visit (AMBULATORY_SURGERY_CENTER): Payer: BC Managed Care – PPO | Admitting: Internal Medicine

## 2012-10-20 VITALS — BP 118/79 | HR 67 | Temp 97.9°F | Resp 23 | Ht 63.0 in | Wt 191.0 lb

## 2012-10-20 DIAGNOSIS — D126 Benign neoplasm of colon, unspecified: Secondary | ICD-10-CM

## 2012-10-20 DIAGNOSIS — K5732 Diverticulitis of large intestine without perforation or abscess without bleeding: Secondary | ICD-10-CM

## 2012-10-20 DIAGNOSIS — Z1211 Encounter for screening for malignant neoplasm of colon: Secondary | ICD-10-CM

## 2012-10-20 HISTORY — DX: Diverticulitis of large intestine without perforation or abscess without bleeding: K57.32

## 2012-10-20 MED ORDER — SODIUM CHLORIDE 0.9 % IV SOLN: 500.0000 mL | INTRAVENOUS | Status: DC

## 2012-10-20 NOTE — Progress Notes (Signed)
Called to room to assist during endoscopic procedure.  Patient ID and intended procedure confirmed with present staff. Received instructions for my participation in the procedure from the performing physician.  

## 2012-10-20 NOTE — Patient Instructions (Addendum)
YOU HAD AN ENDOSCOPIC PROCEDURE TODAY AT THE [facility]: Refer to the procedure report that was given to you for any specific questions about what was found during the examination.  If the procedure report does not answer your questions, please call your gastroenterologist to clarify.  If you requested that your care partner not be given the details of your procedure findings, then the procedure report has been included in a sealed envelope for you to review at your convenience later.  YOU SHOULD EXPECT: Some feelings of bloating in the abdomen. Passage of more gas than usual.  Walking can help get rid of the air that was put into your GI tract during the procedure and reduce the bloating. If you had a lower endoscopy (such as a colonoscopy or flexible sigmoidoscopy) you may notice spotting of blood in your stool or on the toilet paper. If you underwent a bowel prep for your procedure, then you may not have a normal bowel movement for a few days.  DIET: Your first meal following the procedure should be a light meal and then it is ok to progress to your normal diet.  A half-sandwich or bowl of soup is an example of a good first meal.  Heavy or fried foods are harder to digest and may make you feel nauseous or bloated.  Likewise meals heavy in dairy and vegetables can cause extra gas to form and this can also increase the bloating.  Drink plenty of fluids but you should avoid alcoholic beverages for 24 hours.  ACTIVITY: Your care partner should take you home directly after the procedure.  You should plan to take it easy, moving slowly for the rest of the day.  You can resume normal activity the day after the procedure however you should NOT DRIVE or use heavy machinery for 24 hours (because of the sedation medicines used during the test).    SYMPTOMS TO REPORT IMMEDIATELY: A gastroenterologist can be reached at any hour.  During normal business hours, 8:30 AM to 5:00 PM Monday through Friday,  call [phone].  After hours and on weekends, please call the GI answering service at [phone] who will take a message and have the physician on call contact you.   Following lower endoscopy (colonoscopy or flexible sigmoidoscopy):  Excessive amounts of blood in the stool  Significant tenderness or worsening of abdominal pains  Swelling of the abdomen that is new, acute  Fever of 100F or higher  FOLLOW UP: If any biopsies were taken you will be contacted by phone or by letter within the next 1-3 weeks.  Call your gastroenterologist if you have not heard about the biopsies in 3 weeks.  Our staff will call the home number listed on your records the next business day following your procedure to check on you and address any questions or concerns that you may have at that time regarding the information given to you following your procedure. This is a courtesy call and so if there is no answer at the home number and we have not heard from you through the emergency physician on call, we will assume that you have returned to your regular daily activities without incident.  SIGNATURES/CONFIDENTIALITY: You and/or your care partner have signed paperwork which will be entered into your electronic medical record.  These signatures attest to the fact that that the information above on your After Visit Summary has been reviewed and is understood.  Full responsibility of the confidentiality of this   discharge information lies with you and/or your care-partner.  Await pathology results High fiber diet If the polyp removed today is proven to be an adenomatous (pre-cancerous) polyp, you will need a repeat colonoscopy in 5 years. Otherwise you should continue to follow colorectal cancer screening guidelines for "routine risk" patients with a colonoscopy in 10 years.

## 2012-10-20 NOTE — Progress Notes (Signed)
Patient did not have preoperative order for IV antibiotic SSI prophylaxis. (G8918)  Patient did not experience any of the following events: a burn prior to discharge; a fall within the facility; wrong site/side/patient/procedure/implant event; or a hospital transfer or hospital admission upon discharge from the facility. (G8907)  

## 2012-10-20 NOTE — Op Note (Signed)
[facility] [address] [place], [address]   COLONOSCOPY PROCEDURE REPORT  PATIENT: Nichole, Jensen  MR#: [id number] BIRTHDATE: 1960-08-29 , 52  yrs. old GENDER: Female ENDOSCOPIST: Beverley Fiedler, MD REFERRED MW:[id number] Dan Humphreys, M.D. PROCEDURE DATE:  10/20/2012 PROCEDURE:   Colonoscopy with snare polypectomy ASA CLASS:   Class I INDICATIONS:average risk screening and first colonoscopy. MEDICATIONS: MAC sedation, administered by CRNA and propofol (Diprivan) 300mg  IV  DESCRIPTION OF PROCEDURE:   After the risks benefits and alternatives of the procedure were thoroughly explained, informed consent was obtained.  A digital rectal exam revealed no rectal mass.   The LB PFC-H190 [id number]  endoscope was introduced through the anus and advanced to the terminal ileum which was intubated for a short distance. No adverse events experienced.   The quality of the prep was good, using MoviPrep  The instrument was then slowly withdrawn as the colon was fully examined.   COLON FINDINGS: A sessile polyp measuring 5 mm in size was found in the rectum.  A polypectomy was performed with a cold snare.  The resection was complete and the polyp tissue was completely retrieved.   Mild diverticulosis was noted in the descending colon and sigmoid colon.   The colon mucosa was otherwise normal. Retroflexed views revealed no abnormalities. The time to cecum=4 minutes 27 seconds.  Withdrawal time=9 minutes 53 seconds.  The scope was withdrawn and the procedure completed.  COMPLICATIONS: There were no complications.  ENDOSCOPIC IMPRESSION: 1.   Sessile polyp measuring 5 mm in size was found in the rectum; polypectomy was performed with a cold snare 2.   Mild diverticulosis was noted in the descending colon and sigmoid colon 3.   The colon mucosa was otherwise normal  RECOMMENDATIONS: 1.  Await pathology results 2.  High fiber diet 3.  If the polyp removed today is proven to be  an adenomatous (pre-cancerous) polyp, you will need a repeat colonoscopy in 5 years.  Otherwise you should continue to follow colorectal cancer screening guidelines for "routine risk" patients with colonoscopy in 10 years.  You will receive a letter within 1-2 weeks with the results of your biopsy as well as final recommendations.  Please call my office if you have not received a letter after 3 weeks.   eSigned:  Beverley Fiedler, MD 10/20/2012 1:23 PM  cc: The Patient and Ronna Polio MD

## 2012-10-23 ENCOUNTER — Telehealth: Payer: Self-pay | Admitting: *Deleted

## 2012-10-23 NOTE — Telephone Encounter (Signed)
No answer, left message to call if questions or concerns. 

## 2012-10-24 ENCOUNTER — Encounter: Payer: Self-pay | Admitting: Internal Medicine

## 2012-11-17 ENCOUNTER — Telehealth: Payer: Self-pay | Admitting: Internal Medicine

## 2012-11-17 NOTE — Telephone Encounter (Signed)
Pt is needing a Mammo ordered for [facility]

## 2012-11-17 NOTE — Telephone Encounter (Signed)
Patient aware she can schedule her own mammo.

## 2012-12-27 ENCOUNTER — Ambulatory Visit: Payer: Self-pay | Admitting: Internal Medicine

## 2012-12-27 LAB — HM MAMMOGRAPHY: HM Mammogram: NORMAL

## 2013-01-11 ENCOUNTER — Encounter: Payer: Self-pay | Admitting: Internal Medicine

## 2013-01-23 ENCOUNTER — Ambulatory Visit (INDEPENDENT_AMBULATORY_CARE_PROVIDER_SITE_OTHER): Payer: BC Managed Care – PPO | Admitting: Adult Health

## 2013-01-23 ENCOUNTER — Encounter: Payer: Self-pay | Admitting: Adult Health

## 2013-01-23 VITALS — BP 108/72 | HR 80 | Temp 98.2°F | Resp 12 | Wt 190.0 lb

## 2013-01-23 DIAGNOSIS — J209 Acute bronchitis, unspecified: Secondary | ICD-10-CM

## 2013-01-23 MED ORDER — AZITHROMYCIN 250 MG PO TABS: ORAL_TABLET | ORAL | Status: DC

## 2013-01-23 NOTE — Progress Notes (Signed)
  Subjective:    Patient ID: Nichole Jensen, female    DOB: [birth year], 52 y.o.   MRN: [id number]  HPI  Pt reports having a tickle in the back of her throat, "barking cough", no reported secretions. She is having some sinus pressure and pain. She also reports discomfort between shoulder blades. She has had pneumonia in the past and is worried about the same thing. She has been c/o headache and chills. No fever. Symptoms similar to these occurred 6 weeks ago and now returned.    Review of Systems  HENT: Positive for sore throat, sneezing, postnasal drip and sinus pressure.        Face pain  Respiratory: Positive for cough and chest tightness. Negative for shortness of breath.        Objective:   Physical Exam  Constitutional: She appears well-developed and well-nourished. No distress.  HENT:  Head: Normocephalic and atraumatic.  Right Ear: External ear normal.  Left Ear: External ear normal.  Pharyngeal erythema with some drainage  Neck: Normal range of motion. No tracheal deviation present. No thyromegaly present.  Cardiovascular: Normal rate, regular rhythm, normal heart sounds and intact distal pulses.  Exam reveals no gallop and no friction rub.   No murmur heard. Pulmonary/Chest: Effort normal and breath sounds normal. No respiratory distress. She has no wheezes.  Rhonchi cleared with cough  Lymphadenopathy:    She has no cervical adenopathy.          Assessment & Plan:

## 2013-01-23 NOTE — Assessment & Plan Note (Addendum)
Suspect symptoms are viral origin. Take over-the-counter medications for symptom management such as Robitussin or Delsym for cough suppression and expectoration, irrigate sinuses with saline spray, Coricidin products are beneficial with least cardiac side effects. I have provided patient with a prescription for azithromycin. Instructed her to take this only if she develops a fever of 101 or greater or if she develops green colored secretions. RTC if symptoms worsen or do not improve within 4-5 days.

## 2013-01-23 NOTE — Patient Instructions (Addendum)
  Drink plenty of fluids  Cough suppressant and expectorant - Robitussin or Delsym  Coricidin products are beneficial for symptom relief without the cardiac side effects.  You can try the nasal strip to help open up your nostrils to help you breathe better.  Saline spray into the nose as much as you like to help irrigate sinuses.  I am giving you a prescription for Azithromycin. Take this only if you develop a fever of 101 or greater, or if you develop green colored secretions.

## 2013-03-01 ENCOUNTER — Other Ambulatory Visit: Payer: Self-pay

## 2013-04-16 ENCOUNTER — Encounter: Payer: Self-pay | Admitting: *Deleted

## 2013-04-17 ENCOUNTER — Ambulatory Visit (INDEPENDENT_AMBULATORY_CARE_PROVIDER_SITE_OTHER): Payer: BC Managed Care – PPO | Admitting: Internal Medicine

## 2013-04-17 ENCOUNTER — Encounter: Payer: Self-pay | Admitting: Internal Medicine

## 2013-04-17 VITALS — BP 118/90 | HR 76 | Temp 98.0°F | Wt 192.0 lb

## 2013-04-17 DIAGNOSIS — E669 Obesity, unspecified: Secondary | ICD-10-CM

## 2013-04-17 DIAGNOSIS — I10 Essential (primary) hypertension: Secondary | ICD-10-CM

## 2013-04-17 DIAGNOSIS — Z1239 Encounter for other screening for malignant neoplasm of breast: Secondary | ICD-10-CM

## 2013-04-17 LAB — COMPREHENSIVE METABOLIC PANEL
ALT: 21 U/L (ref 0–35)
CO2: 26 mEq/L (ref 19–32)
Calcium: 9.2 mg/dL (ref 8.4–10.5)
Chloride: 106 mEq/L (ref 96–112)
Creatinine, Ser: 0.8 mg/dL (ref 0.4–1.2)
GFR: 75.47 mL/min (ref >60.00)

## 2013-04-17 LAB — MICROALBUMIN / CREATININE URINE RATIO: Microalb Creat Ratio: 0.5 mg/g (ref 0.0–30.0)

## 2013-04-17 NOTE — Assessment & Plan Note (Signed)
BP Readings from Last 3 Encounters:  04/17/13 118/90  01/23/13 108/72  10/20/12 118/79   BP generally well controlled on HCTZ. Will check electrolytes and renal function with labs today.

## 2013-04-17 NOTE — Assessment & Plan Note (Signed)
Wt Readings from Last 3 Encounters:  04/17/13 192 lb (87.091 kg)  01/23/13 190 lb (86.183 kg)  10/20/12 191 lb (86.637 kg)   Encouraged healthy diet and regular exercise.

## 2013-04-17 NOTE — Assessment & Plan Note (Signed)
Reviewed recent mammogram which was normal. Discussed in detail comments on mammogram about increased bone density. Will plan to get 3D mammogram next year. Encouraged continued self exams.

## 2013-04-17 NOTE — Progress Notes (Signed)
Subjective:    Patient ID: Nichole Jensen, female    DOB: 02-19-1961, 52 y.o.   MRN: [id number]  HPI 52 year old female with history of hypertension presents for followup. She reports that she is generally feeling well. She is compliant with her medication. No recent headache, chest pain, palpitations. She continues to try to follow a healthy diet and get regular physical activity. Her weight, however, has been unchanged.  She is concerned today about recent mammogram reading which was normal commented on increased breast density. She questions whether she may need ultrasound of her breasts. She denies any new mass, breast pain, or other concerns. She did have a breast biopsy in the past which was benign. She has no family history of breast cancer.  Outpatient Prescriptions Prior to Visit  Medication Sig Dispense Refill  . aspirin 81 MG tablet Take 81 mg by mouth daily.        . hydrochlorothiazide (HYDRODIURIL) 12.5 MG tablet Take 1 tablet (12.5 mg total) by mouth daily.  90 tablet  3   No facility-administered medications prior to visit.   BP 118/90  Pulse 76  Temp(Src) 98 F (36.7 C) (Oral)  Wt 192 lb (87.091 kg)  SpO2 96%  Review of Systems  Constitutional: Negative for fever, chills, appetite change, fatigue and unexpected weight change.  HENT: Negative for congestion, ear pain, sinus pressure, sore throat, trouble swallowing and voice change.   Eyes: Negative for visual disturbance.  Respiratory: Negative for cough, shortness of breath, wheezing and stridor.   Cardiovascular: Negative for chest pain, palpitations and leg swelling.  Gastrointestinal: Negative for nausea, vomiting, abdominal pain, diarrhea, constipation, blood in stool, abdominal distention and anal bleeding.  Genitourinary: Negative for dysuria and flank pain.  Musculoskeletal: Negative for arthralgias, gait problem, myalgias and neck pain.  Skin: Negative for color change and rash.  Neurological: Negative  for dizziness and headaches.  Hematological: Negative for adenopathy. Does not bruise/bleed easily.  Psychiatric/Behavioral: Negative for suicidal ideas, sleep disturbance and dysphoric mood. The patient is not nervous/anxious.        Objective:   Physical Exam  Constitutional: She is oriented to person, place, and time. She appears well-developed and well-nourished. No distress.  HENT:  Head: Normocephalic and atraumatic.  Right Ear: External ear normal.  Left Ear: External ear normal.  Nose: Nose normal.  Mouth/Throat: Oropharynx is clear and moist. No oropharyngeal exudate.  Eyes: Conjunctivae are normal. Pupils are equal, round, and reactive to light. Right eye exhibits no discharge. Left eye exhibits no discharge. No scleral icterus.  Neck: Normal range of motion. Neck supple. No tracheal deviation present. No thyromegaly present.  Cardiovascular: Normal rate, regular rhythm, normal heart sounds and intact distal pulses.  Exam reveals no gallop and no friction rub.   No murmur heard. Pulmonary/Chest: Effort normal and breath sounds normal. No accessory muscle usage. Not tachypneic. No respiratory distress. She has no decreased breath sounds. She has no wheezes. She has no rhonchi. She has no rales. She exhibits no tenderness.  Musculoskeletal: Normal range of motion. She exhibits no edema and no tenderness.  Lymphadenopathy:    She has no cervical adenopathy.  Neurological: She is alert and oriented to person, place, and time. No cranial nerve deficit. She exhibits normal muscle tone. Coordination normal.  Skin: Skin is warm and dry. No rash noted. She is not diaphoretic. No erythema. No pallor.  Psychiatric: She has a normal mood and affect. Her behavior is normal. Judgment and thought content  normal.          Assessment & Plan:

## 2013-04-17 NOTE — Progress Notes (Signed)
Pre-visit discussion using our clinic review tool. No additional management support is needed unless otherwise documented below in the visit note.  

## 2013-06-11 ENCOUNTER — Encounter: Payer: Self-pay | Admitting: Internal Medicine

## 2013-06-11 ENCOUNTER — Ambulatory Visit (INDEPENDENT_AMBULATORY_CARE_PROVIDER_SITE_OTHER): Payer: BC Managed Care – PPO | Admitting: Internal Medicine

## 2013-06-11 VITALS — BP 120/80 | HR 101 | Temp 98.1°F | Wt 191.0 lb

## 2013-06-11 DIAGNOSIS — J209 Acute bronchitis, unspecified: Secondary | ICD-10-CM

## 2013-06-11 DIAGNOSIS — R059 Cough, unspecified: Secondary | ICD-10-CM

## 2013-06-11 DIAGNOSIS — R05 Cough: Secondary | ICD-10-CM

## 2013-06-11 MED ORDER — ALBUTEROL SULFATE HFA 108 (90 BASE) MCG/ACT IN AERS: 2.0000 | INHALATION_SPRAY | Freq: Four times a day (QID) | RESPIRATORY_TRACT | Status: DC | PRN

## 2013-06-11 MED ORDER — HYDROCOD POLST-CHLORPHEN POLST 10-8 MG/5ML PO LQCR: 5.0000 mL | Freq: Two times a day (BID) | ORAL | Status: DC | PRN

## 2013-06-11 MED ORDER — BENZONATATE 200 MG PO CAPS: 200.0000 mg | ORAL_CAPSULE | Freq: Two times a day (BID) | ORAL | Status: DC | PRN

## 2013-06-11 MED ORDER — ALBUTEROL SULFATE (2.5 MG/3ML) 0.083% IN NEBU: 2.5000 mg | INHALATION_SOLUTION | Freq: Four times a day (QID) | RESPIRATORY_TRACT | Status: DC | PRN

## 2013-06-11 MED ORDER — AZITHROMYCIN 250 MG PO TABS: ORAL_TABLET | ORAL | Status: DC

## 2013-06-11 NOTE — Assessment & Plan Note (Signed)
Symptoms and exam are most consistent with acute bronchitis. Will start Azithromycin and prn Albuterol. Discussed starting Prednisone taper, however she has had trouble tolerating this in the past. She will email or call with update tomorrow or Wednesday. If no significant improvement, then will start prednisone taper.

## 2013-06-11 NOTE — Patient Instructions (Signed)

## 2013-06-11 NOTE — Progress Notes (Signed)
Pre-visit discussion using our clinic review tool. No additional management support is needed unless otherwise documented below in the visit note.  

## 2013-06-11 NOTE — Progress Notes (Signed)
   Subjective:    Patient ID: Nichole Jensen, female    DOB: 20-Jul-1960, 53 y.o.   MRN: [id number]  HPI 53YO female presents for acute visit. Friday developed runny nose, cough. Cough mostly non-productive. No fever, chills. Notes tightness in chest and mild dyspnea. Not taking any medications for symptoms.  Review of Systems  Constitutional: Negative for fever, chills and unexpected weight change.  HENT: Positive for congestion, postnasal drip, rhinorrhea and sore throat. Negative for ear discharge, ear pain, facial swelling, hearing loss, mouth sores, nosebleeds, sinus pressure, sneezing, tinnitus, trouble swallowing and voice change.   Eyes: Negative for pain, discharge, redness and visual disturbance.  Respiratory: Positive for cough, chest tightness and shortness of breath. Negative for wheezing and stridor.   Cardiovascular: Negative for chest pain, palpitations and leg swelling.  Musculoskeletal: Negative for arthralgias, myalgias, neck pain and neck stiffness.  Skin: Negative for color change and rash.  Neurological: Negative for dizziness, weakness, light-headedness and headaches.  Hematological: Negative for adenopathy.       Objective:    BP 120/80  Pulse 101  Temp(Src) 98.1 F (36.7 C) (Oral)  Wt 191 lb (86.637 kg)  SpO2 96% Physical Exam  Constitutional: She is oriented to person, place, and time. She appears well-developed and well-nourished. No distress.  HENT:  Head: Normocephalic and atraumatic.  Right Ear: External ear normal.  Left Ear: External ear normal.  Nose: Nose normal.  Mouth/Throat: Oropharynx is clear and moist. No oropharyngeal exudate.  Eyes: Conjunctivae are normal. Pupils are equal, round, and reactive to light. Right eye exhibits no discharge. Left eye exhibits no discharge. No scleral icterus.  Neck: Normal range of motion. Neck supple. No tracheal deviation present. No thyromegaly present.  Cardiovascular: Normal rate, regular rhythm, normal  heart sounds and intact distal pulses.  Exam reveals no gallop and no friction rub.   No murmur heard. Pulmonary/Chest: Effort normal. No accessory muscle usage. Not tachypneic. No respiratory distress. She has decreased breath sounds. She has wheezes. She has rhonchi. She has no rales. She exhibits no tenderness.  Musculoskeletal: Normal range of motion. She exhibits no edema and no tenderness.  Lymphadenopathy:    She has no cervical adenopathy.  Neurological: She is alert and oriented to person, place, and time. No cranial nerve deficit. She exhibits normal muscle tone. Coordination normal.  Skin: Skin is warm and dry. No rash noted. She is not diaphoretic. No erythema. No pallor.  Psychiatric: She has a normal mood and affect. Her behavior is normal. Judgment and thought content normal.          Assessment & Plan:   Problem List Items Addressed This Visit   Acute bronchitis - Primary     Symptoms and exam are most consistent with acute bronchitis. Will start Azithromycin and prn Albuterol. Discussed starting Prednisone taper, however she has had trouble tolerating this in the past. She will email or call with update tomorrow or Wednesday. If no significant improvement, then will start prednisone taper.    Relevant Medications      azithromycin (ZITHROMAX) tablet      benzonatate (TESSALON) capsule      chlorpheniramine-HYDROcodone (TUSSIONEX) suspension 8-10 mg/71mL      ALBUTEROL SULFATE HFA 108 (90 BASE) MCG/ACT IN AERS    Other Visit Diagnoses   Cough            Return if symptoms worsen or fail to improve.

## 2013-06-13 ENCOUNTER — Encounter: Payer: Self-pay | Admitting: Internal Medicine

## 2013-06-17 ENCOUNTER — Encounter: Payer: Self-pay | Admitting: Internal Medicine

## 2013-08-25 ENCOUNTER — Other Ambulatory Visit: Payer: Self-pay | Admitting: Internal Medicine

## 2013-09-03 ENCOUNTER — Encounter: Payer: Self-pay | Admitting: Internal Medicine

## 2013-09-03 ENCOUNTER — Ambulatory Visit (INDEPENDENT_AMBULATORY_CARE_PROVIDER_SITE_OTHER): Payer: BC Managed Care – PPO | Admitting: Internal Medicine

## 2013-09-03 ENCOUNTER — Ambulatory Visit: Payer: BC Managed Care – PPO | Admitting: Adult Health

## 2013-09-03 VITALS — BP 122/84 | HR 85 | Temp 98.5°F | Wt 192.5 lb

## 2013-09-03 DIAGNOSIS — J209 Acute bronchitis, unspecified: Secondary | ICD-10-CM

## 2013-09-03 MED ORDER — LEVOFLOXACIN 500 MG PO TABS: 500.0000 mg | ORAL_TABLET | Freq: Every day | ORAL | Status: DC

## 2013-09-03 MED ORDER — HYDROCHLOROTHIAZIDE 12.5 MG PO CAPS: ORAL_CAPSULE | ORAL | Status: DC

## 2013-09-03 MED ORDER — PREDNISONE 10 MG PO TABS: ORAL_TABLET | ORAL | Status: DC

## 2013-09-03 NOTE — Progress Notes (Signed)
Subjective:    Patient ID: Nichole Jensen, female    DOB: Dec 12, 1960, 53 y.o.   MRN: [id number]  HPI 53YO female presents for acute visit.  Last week started increased sneezing, congestion. Started Claritin with no improvement. Last night, developed bronchial cough and shortness of breath. Took some left over tussionex with improvement. Used Albuterol inhaler with improvement. Notes burning and pressure and pressure over sinuses. Congestion mostly clear. Cough productive of occasional mucous. No fever.   Review of Systems  Constitutional: Positive for fatigue. Negative for fever, chills, appetite change and unexpected weight change.  HENT: Positive for congestion, rhinorrhea, sinus pressure and sneezing. Negative for ear pain, sore throat, trouble swallowing and voice change.   Eyes: Negative for visual disturbance.  Respiratory: Positive for cough, chest tightness, shortness of breath and wheezing. Negative for stridor.   Cardiovascular: Negative for chest pain, palpitations and leg swelling.  Gastrointestinal: Negative for nausea, vomiting, abdominal pain, diarrhea, constipation, blood in stool, abdominal distention and anal bleeding.  Genitourinary: Negative for dysuria and flank pain.  Musculoskeletal: Negative for arthralgias, gait problem, myalgias and neck pain.  Skin: Negative for color change and rash.  Neurological: Negative for dizziness and headaches.  Hematological: Negative for adenopathy. Does not bruise/bleed easily.  Psychiatric/Behavioral: Negative for suicidal ideas, sleep disturbance and dysphoric mood. The patient is not nervous/anxious.        Objective:    BP 122/84  Pulse 85  Temp(Src) 98.5 F (36.9 C) (Oral)  Wt 192 lb 8 oz (87.317 kg)  SpO2 97% Physical Exam  Constitutional: She is oriented to person, place, and time. She appears well-developed and well-nourished. No distress.  HENT:  Head: Normocephalic and atraumatic.  Right Ear: External ear  normal.  Left Ear: External ear normal.  Nose: Nose normal.  Mouth/Throat: Oropharynx is clear and moist. No oropharyngeal exudate.  Eyes: Conjunctivae are normal. Pupils are equal, round, and reactive to light. Right eye exhibits no discharge. Left eye exhibits no discharge. No scleral icterus.  Neck: Normal range of motion. Neck supple. No tracheal deviation present. No thyromegaly present.  Cardiovascular: Normal rate, regular rhythm, normal heart sounds and intact distal pulses.  Exam reveals no gallop and no friction rub.   No murmur heard. Pulmonary/Chest: Effort normal. No accessory muscle usage. Not tachypneic. No respiratory distress. She has decreased breath sounds (prolonged expiration). She has wheezes. She has rhonchi (scattered). She has no rales. She exhibits no tenderness.  Musculoskeletal: Normal range of motion. She exhibits no edema and no tenderness.  Lymphadenopathy:    She has no cervical adenopathy.  Neurological: She is alert and oriented to person, place, and time. No cranial nerve deficit. She exhibits normal muscle tone. Coordination normal.  Skin: Skin is warm and dry. No rash noted. She is not diaphoretic. No erythema. No pallor.  Psychiatric: She has a normal mood and affect. Her behavior is normal. Judgment and thought content normal.          Assessment & Plan:   Problem List Items Addressed This Visit   Acute bronchitis - Primary     Symptoms consistent with acute bronchitis, likely triggered by recent seasonal allergies. Will start Levaquin. She is reluctant to start Prednisone taper, however will monitor symptoms and start if symptoms are persistent over the next 24-48hr. Continue inhaled albuterol. Continue Tussionex prn. Follow up prn if symptoms not improving over the next several days.    Relevant Medications      levofloxacin (LEVAQUIN) tablet  predniSONE (DELTASONE) tablet       Return if symptoms worsen or fail to improve.

## 2013-09-03 NOTE — Progress Notes (Signed)
Pre visit review using our clinic review tool, if applicable. No additional management support is needed unless otherwise documented below in the visit note. 

## 2013-09-03 NOTE — Patient Instructions (Signed)
Start Levaquin daily.  If symptoms are persistent or worsening tomorrow or Wednesday, start Prednisone taper.  Continue Albuterol inhaler as needed.  Continue cough syrup as needed.  Follow up if symptoms are not improving.

## 2013-09-03 NOTE — Assessment & Plan Note (Signed)
Symptoms consistent with acute bronchitis, likely triggered by recent seasonal allergies. Will start Levaquin. She is reluctant to start Prednisone taper, however will monitor symptoms and start if symptoms are persistent over the next 24-48hr. Continue inhaled albuterol. Continue Tussionex prn. Follow up prn if symptoms not improving over the next several days.

## 2013-11-15 ENCOUNTER — Telehealth: Payer: Self-pay | Admitting: Internal Medicine

## 2013-11-15 NOTE — Telephone Encounter (Signed)
Please advise 

## 2013-11-15 NOTE — Telephone Encounter (Signed)
Increased breast density is generally not an indication for diagnostic mammogram. Can you clarify? Did she have an abnormal mammogram in the past?

## 2013-11-15 NOTE — Telephone Encounter (Signed)
The patient called stating she needed a mammogram . She stated she had some breast density and she was needing a diagnostic mammogram.

## 2013-11-22 NOTE — Telephone Encounter (Signed)
Pt has not had an annual exam so she is scheduling an Annual exam appt, advised pt that her and MD could discuss the type of mammogram that she was needing during that appt and we could get the mammogram appt scheduled at that time.

## 2014-01-01 ENCOUNTER — Ambulatory Visit (INDEPENDENT_AMBULATORY_CARE_PROVIDER_SITE_OTHER): Payer: BC Managed Care – PPO | Admitting: Internal Medicine

## 2014-01-01 ENCOUNTER — Encounter: Payer: Self-pay | Admitting: Internal Medicine

## 2014-01-01 VITALS — BP 122/82 | HR 87 | Temp 98.1°F | Ht 62.75 in | Wt 194.2 lb

## 2014-01-01 DIAGNOSIS — Z0000 Encounter for general adult medical examination without abnormal findings: Secondary | ICD-10-CM

## 2014-01-01 DIAGNOSIS — E669 Obesity, unspecified: Secondary | ICD-10-CM

## 2014-01-01 DIAGNOSIS — Z23 Encounter for immunization: Secondary | ICD-10-CM

## 2014-01-01 NOTE — Progress Notes (Signed)
Subjective:    Patient ID: Nichole Jensen, female    DOB: 05-27-1960, 53 y.o.   MRN: [id number]  HPI 53YO female presents for annual exam. Feeling well. No concerns today. S/p hysterectomy. Mammogram due. Colonoscopy completed in 09/2012 and was normal. Trying to follow healthy diet and stay active.  Review of Systems  Constitutional: Negative for fever, chills, appetite change, fatigue and unexpected weight change.  Eyes: Negative for visual disturbance.  Respiratory: Negative for shortness of breath.   Cardiovascular: Negative for chest pain and leg swelling.  Gastrointestinal: Negative for nausea, vomiting, abdominal pain, diarrhea and constipation.  Musculoskeletal: Negative for arthralgias and myalgias.  Skin: Negative for color change and rash.  Hematological: Negative for adenopathy. Does not bruise/bleed easily.  Psychiatric/Behavioral: Negative for dysphoric mood. The patient is not nervous/anxious.        Objective:    BP 122/82  Pulse 87  Temp(Src) 98.1 F (36.7 C) (Oral)  Ht 5' 2.75" (1.594 m)  Wt 194 lb 4 oz (88.111 kg)  BMI 34.68 kg/m2  SpO2 97% Physical Exam  Constitutional: She is oriented to person, place, and time. She appears well-developed and well-nourished. No distress.  HENT:  Head: Normocephalic and atraumatic.  Right Ear: External ear normal.  Left Ear: External ear normal.  Nose: Nose normal.  Mouth/Throat: Oropharynx is clear and moist. No oropharyngeal exudate.  Eyes: Conjunctivae are normal. Pupils are equal, round, and reactive to light. Right eye exhibits no discharge. Left eye exhibits no discharge. No scleral icterus.  Neck: Normal range of motion. Neck supple. No tracheal deviation present. No thyromegaly present.  Cardiovascular: Normal rate, regular rhythm, normal heart sounds and intact distal pulses.  Exam reveals no gallop and no friction rub.   No murmur heard. Pulmonary/Chest: Effort normal and breath sounds normal. No  accessory muscle usage. Not tachypneic. No respiratory distress. She has no decreased breath sounds. She has no wheezes. She has no rales. She exhibits no tenderness. Right breast exhibits no inverted nipple, no mass, no nipple discharge, no skin change and no tenderness. Left breast exhibits no inverted nipple, no mass, no nipple discharge, no skin change and no tenderness. Breasts are symmetrical.  Abdominal: Soft. Bowel sounds are normal. She exhibits no distension and no mass. There is no tenderness. There is no rebound and no guarding.  Musculoskeletal: Normal range of motion. She exhibits no edema and no tenderness.  Lymphadenopathy:    She has no cervical adenopathy.  Neurological: She is alert and oriented to person, place, and time. No cranial nerve deficit. She exhibits normal muscle tone. Coordination normal.  Skin: Skin is warm and dry. No rash noted. She is not diaphoretic. No erythema. No pallor.  Psychiatric: She has a normal mood and affect. Her behavior is normal. Judgment and thought content normal.          Assessment & Plan:   Problem List Items Addressed This Visit     Unprioritized   Obesity (BMI 30-39.9)      Wt Readings from Last 3 Encounters:  01/01/14 194 lb 4 oz (88.111 kg)  09/03/13 192 lb 8 oz (87.317 kg)  06/11/13 191 lb (86.637 kg)   Body mass index is 34.68 kg/(m^2).   Encouraged healthy diet and exercise.    Routine general medical examination at a health care facility - Primary     General medical exam including breast exam normal today. PAP and pelvic deferred as normal 2012 and pt s/p complete hysterectomy. Colonoscopy  normal 2014. Mammogram ordered. Flu vaccine given today. Labs including CBC, CMP, lipids, TSH today. Encouraged healthy diet and exercise.    Relevant Orders      MM Digital Screening      CBC with Differential      Comprehensive metabolic panel      Lipid panel      Microalbumin / creatinine urine ratio      Vit D  25 hydroxy  (rtn osteoporosis monitoring)      TSH    Other Visit Diagnoses   Need for prophylactic vaccination and inoculation against influenza        Relevant Orders       Flu Vaccine QUAD 36+ mos PF IM (Fluarix Quad PF) (Completed)        Return in about 6 months (around 07/02/2014) for Recheck.

## 2014-01-01 NOTE — Assessment & Plan Note (Signed)
Wt Readings from Last 3 Encounters:  01/01/14 194 lb 4 oz (88.111 kg)  09/03/13 192 lb 8 oz (87.317 kg)  06/11/13 191 lb (86.637 kg)   Body mass index is 34.68 kg/(m^2).   Encouraged healthy diet and exercise.

## 2014-01-01 NOTE — Assessment & Plan Note (Signed)
General medical exam including breast exam normal today. PAP and pelvic deferred as normal 2012 and pt s/p complete hysterectomy. Colonoscopy normal 2014. Mammogram ordered. Flu vaccine given today. Labs including CBC, CMP, lipids, TSH today. Encouraged healthy diet and exercise.

## 2014-01-01 NOTE — Patient Instructions (Signed)

## 2014-01-01 NOTE — Progress Notes (Signed)
Pre visit review using our clinic review tool, if applicable. No additional management support is needed unless otherwise documented below in the visit note. 

## 2014-01-02 LAB — VITAMIN D 25 HYDROXY (VIT D DEFICIENCY, FRACTURES): VITD: 38.94 ng/mL (ref 30.00–100.00)

## 2014-01-02 LAB — LIPID PANEL
CHOL/HDL RATIO: 4
Cholesterol: 185 mg/dL (ref 0–200)
HDL: 48.5 mg/dL (ref >39.00)
LDL Cholesterol: 104 mg/dL — ABNORMAL HIGH (ref 0–99)
NonHDL: 136.5
Triglycerides: 165 mg/dL — ABNORMAL HIGH (ref 0.0–149.0)
VLDL: 33 mg/dL (ref 0.0–40.0)

## 2014-01-02 LAB — COMPREHENSIVE METABOLIC PANEL
ALK PHOS: 88 U/L (ref 39–117)
ALT: 26 U/L (ref 0–35)
AST: 27 U/L (ref 0–37)
Albumin: 4.3 g/dL (ref 3.5–5.2)
BILIRUBIN TOTAL: 0.6 mg/dL (ref 0.2–1.2)
BUN: 16 mg/dL (ref 6–23)
CO2: 24 mEq/L (ref 19–32)
Calcium: 9.8 mg/dL (ref 8.4–10.5)
Chloride: 104 mEq/L (ref 96–112)
Creatinine, Ser: 0.9 mg/dL (ref 0.4–1.2)
GFR: 66.1 mL/min (ref >60.00)
Glucose, Bld: 100 mg/dL — ABNORMAL HIGH (ref 70–99)
Potassium: 3.6 mEq/L (ref 3.5–5.1)
Sodium: 139 mEq/L (ref 135–145)
Total Protein: 7.3 g/dL (ref 6.0–8.3)

## 2014-01-02 LAB — CBC WITH DIFFERENTIAL/PLATELET
BASOS ABS: 0 10*3/uL (ref 0.0–0.1)
BASOS PCT: 0.5 % (ref 0.0–3.0)
EOS ABS: 0.1 10*3/uL (ref 0.0–0.7)
Eosinophils Relative: 0.6 % (ref 0.0–5.0)
HEMATOCRIT: 42.6 % (ref 36.0–46.0)
HEMOGLOBIN: 14.1 g/dL (ref 12.0–15.0)
LYMPHS ABS: 2.6 10*3/uL (ref 0.7–4.0)
LYMPHS PCT: 28.2 % (ref 12.0–46.0)
MCHC: 33.1 g/dL (ref 30.0–36.0)
MCV: 85.1 fl (ref 78.0–100.0)
MONO ABS: 0.5 10*3/uL (ref 0.1–1.0)
Monocytes Relative: 5.8 % (ref 3.0–12.0)
NEUTROS ABS: 6 10*3/uL (ref 1.4–7.7)
Neutrophils Relative %: 64.9 % (ref 43.0–77.0)
Platelets: 229 10*3/uL (ref 150.0–400.0)
RBC: 5 Mil/uL (ref 3.87–5.11)
RDW: 13.2 % (ref 11.5–15.5)
WBC: 9.3 10*3/uL (ref 4.0–10.5)

## 2014-01-02 LAB — MICROALBUMIN / CREATININE URINE RATIO
Creatinine,U: 80 mg/dL
Microalb Creat Ratio: 2.8 mg/g (ref 0.0–30.0)
Microalb, Ur: 2.2 mg/dL — ABNORMAL HIGH (ref 0.0–1.9)

## 2014-01-02 LAB — TSH: TSH: 1.81 u[IU]/mL (ref 0.35–4.50)

## 2014-02-08 ENCOUNTER — Ambulatory Visit: Payer: Self-pay | Admitting: Internal Medicine

## 2014-02-08 ENCOUNTER — Encounter: Payer: Self-pay | Admitting: *Deleted

## 2014-02-08 LAB — HM MAMMOGRAPHY: HM Mammogram: NEGATIVE

## 2014-08-22 ENCOUNTER — Other Ambulatory Visit: Payer: Self-pay | Admitting: Internal Medicine

## 2014-09-05 NOTE — Telephone Encounter (Signed)
Patient returned phone call to confirm that prescription was picked up.

## 2014-09-05 NOTE — Telephone Encounter (Signed)
Attempted to call to inform patient of refill request that was filled, no answer.   Mychart message was unread.

## 2014-10-18 ENCOUNTER — Encounter: Payer: Self-pay | Admitting: Internal Medicine

## 2014-10-18 ENCOUNTER — Ambulatory Visit (INDEPENDENT_AMBULATORY_CARE_PROVIDER_SITE_OTHER): Payer: BC Managed Care – PPO | Admitting: Internal Medicine

## 2014-10-18 VITALS — BP 109/74 | HR 80 | Temp 98.1°F | Ht 62.75 in | Wt 199.2 lb

## 2014-10-18 DIAGNOSIS — Z1239 Encounter for other screening for malignant neoplasm of breast: Secondary | ICD-10-CM

## 2014-10-18 DIAGNOSIS — Z801 Family history of malignant neoplasm of trachea, bronchus and lung: Secondary | ICD-10-CM

## 2014-10-18 DIAGNOSIS — Z1283 Encounter for screening for malignant neoplasm of skin: Secondary | ICD-10-CM | POA: Insufficient documentation

## 2014-10-18 DIAGNOSIS — I10 Essential (primary) hypertension: Secondary | ICD-10-CM | POA: Diagnosis not present

## 2014-10-18 MED ORDER — HYDROCHLOROTHIAZIDE 12.5 MG PO CAPS: ORAL_CAPSULE | ORAL | Status: DC

## 2014-10-18 NOTE — Assessment & Plan Note (Signed)
Mammogram due in 12/2014. Order placed.

## 2014-10-18 NOTE — Progress Notes (Signed)
Subjective:    Patient ID: Nichole Jensen, female    DOB: 11/05/1960, 54 y.o.   MRN: [id number]  HPI  54YO female presents for follow up.  HTN - Occasionally feels that BP might be high, however BP has been well controlled, typically 112-117/70-80s. Compliant with medication. Trying to eliminate processed food to help with fluid retention.  Difficult time for her. Brother is dying from metastatic lung cancer.   Wt Readings from Last 3 Encounters:  10/18/14 199 lb 4 oz (90.379 kg)  01/01/14 194 lb 4 oz (88.111 kg)  09/03/13 192 lb 8 oz (87.317 kg)   BP Readings from Last 3 Encounters:  10/18/14 109/74  01/01/14 122/82  09/03/13 122/84     Past medical, surgical, family and social history per today's encounter.  Review of Systems  Constitutional: Negative for fever, chills, appetite change, fatigue and unexpected weight change.  Eyes: Negative for visual disturbance.  Respiratory: Negative for cough and shortness of breath.   Cardiovascular: Negative for chest pain and leg swelling.  Gastrointestinal: Negative for nausea, vomiting, abdominal pain, diarrhea and constipation.  Musculoskeletal: Negative for myalgias and arthralgias.  Skin: Negative for color change and rash.  Hematological: Negative for adenopathy. Does not bruise/bleed easily.  Psychiatric/Behavioral: Positive for dysphoric mood. Negative for suicidal ideas and sleep disturbance. The patient is nervous/anxious.        Objective:    BP 109/74 mmHg  Pulse 80  Temp(Src) 98.1 F (36.7 C) (Oral)  Ht 5' 2.75" (1.594 m)  Wt 199 lb 4 oz (90.379 kg)  BMI 35.57 kg/m2  SpO2 96% Physical Exam  Constitutional: She is oriented to person, place, and time. She appears well-developed and well-nourished. No distress.  HENT:  Head: Normocephalic and atraumatic.  Right Ear: External ear normal.  Left Ear: External ear normal.  Nose: Nose normal.  Mouth/Throat: Oropharynx is clear and moist. No oropharyngeal  exudate.  Eyes: Conjunctivae are normal. Pupils are equal, round, and reactive to light. Right eye exhibits no discharge. Left eye exhibits no discharge. No scleral icterus.  Neck: Normal range of motion. Neck supple. No tracheal deviation present. No thyromegaly present.  Cardiovascular: Normal rate, regular rhythm, normal heart sounds and intact distal pulses.  Exam reveals no gallop and no friction rub.   No murmur heard. Pulmonary/Chest: Effort normal and breath sounds normal. No respiratory distress. She has no wheezes. She has no rales. She exhibits no tenderness.  Musculoskeletal: Normal range of motion. She exhibits no edema or tenderness.  Lymphadenopathy:    She has no cervical adenopathy.  Neurological: She is alert and oriented to person, place, and time. No cranial nerve deficit. She exhibits normal muscle tone. Coordination normal.  Skin: Skin is warm and dry. No rash noted. She is not diaphoretic. No erythema. No pallor.  Psychiatric: She has a normal mood and affect. Her speech is normal and behavior is normal. Judgment and thought content normal.          Assessment & Plan:  Over 33min of which >50% spent in face-to-face contact with patient discussing plan of care  Problem List Items Addressed This Visit      Unprioritized   Family history of lung cancer    Brother with metastatic lung cancer. Pt is non-smoker. Asymptomatic, exam normal. Discussed potential CXR for screening versus CT scan. Discussed lack of current guidelines for screening for lung cancer for non-smokers.      Hypertension - Primary    BP Readings from  Last 3 Encounters:  10/18/14 109/74  01/01/14 122/82  09/03/13 122/84   BP well controlled. Will plan to check renal function in 12/2014. She is concerned about risk of CAD. Discussed some options for further risk assessment such as coronary artery calcium scoring.      Relevant Medications   hydrochlorothiazide (MICROZIDE) 12.5 MG capsule    Screening for breast cancer    Mammogram due in 12/2014. Order placed.      Relevant Orders   MM Digital Screening   Screening for skin cancer    Referral placed for general skin cancer screening      Relevant Orders   Ambulatory referral to Dermatology       Return in about 3 months (around 01/18/2015) for Physical.

## 2014-10-18 NOTE — Progress Notes (Signed)
Pre visit review using our clinic review tool, if applicable. No additional management support is needed unless otherwise documented below in the visit note. 

## 2014-10-18 NOTE — Assessment & Plan Note (Signed)
Referral placed for general skin cancer screening

## 2014-10-18 NOTE — Patient Instructions (Signed)
We will consider chest xray and coronary artery calcium scoring at your physical in 12/2014.

## 2014-10-18 NOTE — Assessment & Plan Note (Signed)
BP Readings from Last 3 Encounters:  10/18/14 109/74  01/01/14 122/82  09/03/13 122/84   BP well controlled. Will plan to check renal function in 12/2014. She is concerned about risk of CAD. Discussed some options for further risk assessment such as coronary artery calcium scoring.

## 2014-10-18 NOTE — Assessment & Plan Note (Signed)
Brother with metastatic lung cancer. Pt is non-smoker. Asymptomatic, exam normal. Discussed potential CXR for screening versus CT scan. Discussed lack of current guidelines for screening for lung cancer for non-smokers.

## 2015-02-10 ENCOUNTER — Ambulatory Visit: Payer: BC Managed Care – PPO

## 2015-02-11 ENCOUNTER — Ambulatory Visit
Admission: RE | Admit: 2015-02-11 | Discharge: 2015-02-11 | Disposition: A | Discharge status: Home / Self Care | Payer: BC Managed Care – PPO | Source: Ambulatory Visit | Attending: Internal Medicine | Admitting: Internal Medicine

## 2015-02-11 ENCOUNTER — Ambulatory Visit (INDEPENDENT_AMBULATORY_CARE_PROVIDER_SITE_OTHER): Payer: BC Managed Care – PPO | Admitting: Internal Medicine

## 2015-02-11 ENCOUNTER — Encounter: Payer: Self-pay | Admitting: Internal Medicine

## 2015-02-11 VITALS — BP 128/78 | HR 78 | Temp 97.7°F | Ht 62.25 in | Wt 200.1 lb

## 2015-02-11 DIAGNOSIS — Z801 Family history of malignant neoplasm of trachea, bronchus and lung: Secondary | ICD-10-CM | POA: Diagnosis not present

## 2015-02-11 DIAGNOSIS — R059 Cough, unspecified: Secondary | ICD-10-CM | POA: Insufficient documentation

## 2015-02-11 DIAGNOSIS — R05 Cough: Secondary | ICD-10-CM

## 2015-02-11 DIAGNOSIS — Z1231 Encounter for screening mammogram for malignant neoplasm of breast: Secondary | ICD-10-CM | POA: Insufficient documentation

## 2015-02-11 DIAGNOSIS — Z0000 Encounter for general adult medical examination without abnormal findings: Secondary | ICD-10-CM

## 2015-02-11 DIAGNOSIS — Z1239 Encounter for other screening for malignant neoplasm of breast: Secondary | ICD-10-CM

## 2015-02-11 LAB — COMPREHENSIVE METABOLIC PANEL
ALBUMIN: 4.2 g/dL (ref 3.5–5.2)
ALK PHOS: 99 U/L (ref 39–117)
ALT: 15 U/L (ref 0–35)
AST: 12 U/L (ref 0–37)
BUN: 16 mg/dL (ref 6–23)
CO2: 27 mEq/L (ref 19–32)
CREATININE: 0.77 mg/dL (ref 0.40–1.20)
Calcium: 9.5 mg/dL (ref 8.4–10.5)
Chloride: 104 mEq/L (ref 96–112)
GFR: 82.86 mL/min (ref >60.00)
Glucose, Bld: 107 mg/dL — ABNORMAL HIGH (ref 70–99)
POTASSIUM: 4 meq/L (ref 3.5–5.1)
SODIUM: 139 meq/L (ref 135–145)
TOTAL PROTEIN: 6.7 g/dL (ref 6.0–8.3)
Total Bilirubin: 0.4 mg/dL (ref 0.2–1.2)

## 2015-02-11 LAB — LIPID PANEL
Cholesterol: 178 mg/dL (ref 0–200)
HDL: 46.7 mg/dL (ref >39.00)
NONHDL: 131.31
Total CHOL/HDL Ratio: 4
Triglycerides: 206 mg/dL — ABNORMAL HIGH (ref 0.0–149.0)
VLDL: 41.2 mg/dL — AB (ref 0.0–40.0)

## 2015-02-11 LAB — CBC WITH DIFFERENTIAL/PLATELET
Basophils Absolute: 0 10*3/uL (ref 0.0–0.1)
Basophils Relative: 0.4 % (ref 0.0–3.0)
EOS PCT: 1.7 % (ref 0.0–5.0)
Eosinophils Absolute: 0.1 10*3/uL (ref 0.0–0.7)
HCT: 41.2 % (ref 36.0–46.0)
HEMOGLOBIN: 13.6 g/dL (ref 12.0–15.0)
LYMPHS PCT: 25.5 % (ref 12.0–46.0)
Lymphs Abs: 2 10*3/uL (ref 0.7–4.0)
MCHC: 33 g/dL (ref 30.0–36.0)
MCV: 84.9 fl (ref 78.0–100.0)
MONO ABS: 0.6 10*3/uL (ref 0.1–1.0)
Monocytes Relative: 7.7 % (ref 3.0–12.0)
Neutro Abs: 5.1 10*3/uL (ref 1.4–7.7)
Neutrophils Relative %: 64.7 % (ref 43.0–77.0)
Platelets: 223 10*3/uL (ref 150.0–400.0)
RBC: 4.86 Mil/uL (ref 3.87–5.11)
RDW: 13.4 % (ref 11.5–15.5)
WBC: 7.9 10*3/uL (ref 4.0–10.5)

## 2015-02-11 LAB — MICROALBUMIN / CREATININE URINE RATIO
Creatinine,U: 77.1 mg/dL
MICROALB/CREAT RATIO: 3.8 mg/g (ref 0.0–30.0)
Microalb, Ur: 2.9 mg/dL — ABNORMAL HIGH (ref 0.0–1.9)

## 2015-02-11 LAB — HEMOGLOBIN A1C: Hgb A1c MFr Bld: 6.3 % (ref 4.6–6.5)

## 2015-02-11 LAB — LDL CHOLESTEROL, DIRECT: Direct LDL: 97 mg/dL

## 2015-02-11 LAB — VITAMIN D 25 HYDROXY (VIT D DEFICIENCY, FRACTURES): VITD: 27.77 ng/mL — AB (ref 30.00–100.00)

## 2015-02-11 LAB — TSH: TSH: 2.11 u[IU]/mL (ref 0.35–4.50)

## 2015-02-11 NOTE — Patient Instructions (Signed)
Health Maintenance, Female Adopting a healthy lifestyle and getting preventive care can go a long way to promote health and wellness. Talk with your health care provider about what schedule of regular examinations is right for you. This is a good chance for you to check in with your provider about disease prevention and staying healthy. In between checkups, there are plenty of things you can do on your own. Experts have done a lot of research about which lifestyle changes and preventive measures are most likely to keep you healthy. Ask your health care provider for more information. WEIGHT AND DIET  Eat a healthy diet  Be sure to include plenty of vegetables, fruits, low-fat dairy products, and lean protein.  Do not eat a lot of foods high in solid fats, added sugars, or salt.  Get regular exercise. This is one of the most important things you can do for your health.  Most adults should exercise for at least 150 minutes each week. The exercise should increase your heart rate and make you sweat (moderate-intensity exercise).  Most adults should also do strengthening exercises at least twice a week. This is in addition to the moderate-intensity exercise.  Maintain a healthy weight  Body mass index (BMI) is a measurement that can be used to identify possible weight problems. It estimates body fat based on height and weight. Your health care provider can help determine your BMI and help you achieve or maintain a healthy weight.  For females 20 years of age and older:   A BMI below 18.5 is considered underweight.  A BMI of 18.5 to 24.9 is normal.  A BMI of 25 to 29.9 is considered overweight.  A BMI of 30 and above is considered obese.  Watch levels of cholesterol and blood lipids  You should start having your blood tested for lipids and cholesterol at 54 years of age, then have this test every 5 years.  You may need to have your cholesterol levels checked more often if:  Your lipid  or cholesterol levels are high.  You are older than 54 years of age.  You are at high risk for heart disease.  CANCER SCREENING   Lung Cancer  Lung cancer screening is recommended for adults 55-80 years old who are at high risk for lung cancer because of a history of smoking.  A yearly low-dose CT scan of the lungs is recommended for people who:  Currently smoke.  Have quit within the past 15 years.  Have at least a 30-pack-year history of smoking. A pack year is smoking an average of one pack of cigarettes a day for 1 year.  Yearly screening should continue until it has been 15 years since you quit.  Yearly screening should stop if you develop a health problem that would prevent you from having lung cancer treatment.  Breast Cancer  Practice breast self-awareness. This means understanding how your breasts normally appear and feel.  It also means doing regular breast self-exams. Let your health care provider know about any changes, no matter how small.  If you are in your 20s or 30s, you should have a clinical breast exam (CBE) by a health care provider every 1-3 years as part of a regular health exam.  If you are 40 or older, have a CBE every year. Also consider having a breast X-ray (mammogram) every year.  If you have a family history of breast cancer, talk to your health care provider about genetic screening.  If you   are at high risk for breast cancer, talk to your health care provider about having an MRI and a mammogram every year.  Breast cancer gene (BRCA) assessment is recommended for women who have family members with BRCA-related cancers. BRCA-related cancers include:  Breast.  Ovarian.  Tubal.  Peritoneal cancers.  Results of the assessment will determine the need for genetic counseling and BRCA1 and BRCA2 testing. Cervical Cancer Your health care provider may recommend that you be screened regularly for cancer of the pelvic organs (ovaries, uterus, and  vagina). This screening involves a pelvic examination, including checking for microscopic changes to the surface of your cervix (Pap test). You may be encouraged to have this screening done every 3 years, beginning at age 21.  For women ages 30-65, health care providers may recommend pelvic exams and Pap testing every 3 years, or they may recommend the Pap and pelvic exam, combined with testing for human papilloma virus (HPV), every 5 years. Some types of HPV increase your risk of cervical cancer. Testing for HPV may also be done on women of any age with unclear Pap test results.  Other health care providers may not recommend any screening for nonpregnant women who are considered low risk for pelvic cancer and who do not have symptoms. Ask your health care provider if a screening pelvic exam is right for you.  If you have had past treatment for cervical cancer or a condition that could lead to cancer, you need Pap tests and screening for cancer for at least 20 years after your treatment. If Pap tests have been discontinued, your risk factors (such as having a new sexual partner) need to be reassessed to determine if screening should resume. Some women have medical problems that increase the chance of getting cervical cancer. In these cases, your health care provider may recommend more frequent screening and Pap tests. Colorectal Cancer  This type of cancer can be detected and often prevented.  Routine colorectal cancer screening usually begins at 54 years of age and continues through 54 years of age.  Your health care provider may recommend screening at an earlier age if you have risk factors for colon cancer.  Your health care provider may also recommend using home test kits to check for hidden blood in the stool.  A small camera at the end of a tube can be used to examine your colon directly (sigmoidoscopy or colonoscopy). This is done to check for the earliest forms of colorectal  cancer.  Routine screening usually begins at age 50.  Direct examination of the colon should be repeated every 5-10 years through 54 years of age. However, you may need to be screened more often if early forms of precancerous polyps or small growths are found. Skin Cancer  Check your skin from head to toe regularly.  Tell your health care provider about any new moles or changes in moles, especially if there is a change in a mole's shape or color.  Also tell your health care provider if you have a mole that is larger than the size of a pencil eraser.  Always use sunscreen. Apply sunscreen liberally and repeatedly throughout the day.  Protect yourself by wearing long sleeves, pants, a wide-brimmed hat, and sunglasses whenever you are outside. HEART DISEASE, DIABETES, AND HIGH BLOOD PRESSURE   High blood pressure causes heart disease and increases the risk of stroke. High blood pressure is more likely to develop in:  People who have blood pressure in the high end   of the normal range (130-139/85-89 mm Hg).  People who are overweight or obese.  People who are African American.  If you are 38-23 years of age, have your blood pressure checked every 3-5 years. If you are 61 years of age or older, have your blood pressure checked every year. You should have your blood pressure measured twice--once when you are at a hospital or clinic, and once when you are not at a hospital or clinic. Record the average of the two measurements. To check your blood pressure when you are not at a hospital or clinic, you can use:  An automated blood pressure machine at a pharmacy.  A home blood pressure monitor.  If you are between 45 years and 39 years old, ask your health care provider if you should take aspirin to prevent strokes.  Have regular diabetes screenings. This involves taking a blood sample to check your fasting blood sugar level.  If you are at a normal weight and have a low risk for diabetes,  have this test once every three years after 54 years of age.  If you are overweight and have a high risk for diabetes, consider being tested at a younger age or more often. PREVENTING INFECTION  Hepatitis B  If you have a higher risk for hepatitis B, you should be screened for this virus. You are considered at high risk for hepatitis B if:  You were born in a country where hepatitis B is common. Ask your health care provider which countries are considered high risk.  Your parents were born in a high-risk country, and you have not been immunized against hepatitis B (hepatitis B vaccine).  You have HIV or AIDS.  You use needles to inject street drugs.  You live with someone who has hepatitis B.  You have had sex with someone who has hepatitis B.  You get hemodialysis treatment.  You take certain medicines for conditions, including cancer, organ transplantation, and autoimmune conditions. Hepatitis C  Blood testing is recommended for:  Everyone born from [birth year] through 1965.  Anyone with known risk factors for hepatitis C. Sexually transmitted infections (STIs)  You should be screened for sexually transmitted infections (STIs) including gonorrhea and chlamydia if:  You are sexually active and are younger than 54 years of age.  You are older than 53 years of age and your health care provider tells you that you are at risk for this type of infection.  Your sexual activity has changed since you were last screened and you are at an increased risk for chlamydia or gonorrhea. Ask your health care provider if you are at risk.  If you do not have HIV, but are at risk, it may be recommended that you take a prescription medicine daily to prevent HIV infection. This is called pre-exposure prophylaxis (PrEP). You are considered at risk if:  You are sexually active and do not regularly use condoms or know the HIV status of your partner(s).  You take drugs by injection.  You are sexually  active with a partner who has HIV. Talk with your health care provider about whether you are at high risk of being infected with HIV. If you choose to begin PrEP, you should first be tested for HIV. You should then be tested every 3 months for as long as you are taking PrEP.  PREGNANCY   If you are premenopausal and you may become pregnant, ask your health care provider about preconception counseling.  If you may  become pregnant, take 400 to 800 micrograms (mcg) of folic acid every day.  If you want to prevent pregnancy, talk to your health care provider about birth control (contraception). OSTEOPOROSIS AND MENOPAUSE   Osteoporosis is a disease in which the bones lose minerals and strength with aging. This can result in serious bone fractures. Your risk for osteoporosis can be identified using a bone density scan.  If you are 61 years of age or older, or if you are at risk for osteoporosis and fractures, ask your health care provider if you should be screened.  Ask your health care provider whether you should take a calcium or vitamin D supplement to lower your risk for osteoporosis.  Menopause may have certain physical symptoms and risks.  Hormone replacement therapy may reduce some of these symptoms and risks. Talk to your health care provider about whether hormone replacement therapy is right for you.  HOME CARE INSTRUCTIONS   Schedule regular health, dental, and eye exams.  Stay current with your immunizations.   Do not use any tobacco products including cigarettes, chewing tobacco, or electronic cigarettes.  If you are pregnant, do not drink alcohol.  If you are breastfeeding, limit how much and how often you drink alcohol.  Limit alcohol intake to no more than 1 drink per day for nonpregnant women. One drink equals 12 ounces of beer, 5 ounces of wine, or 1 ounces of hard liquor.  Do not use street drugs.  Do not share needles.  Ask your health care provider for help if  you need support or information about quitting drugs.  Tell your health care provider if you often feel depressed.  Tell your health care provider if you have ever been abused or do not feel safe at home.   This information is not intended to replace advice given to you by your health care provider. Make sure you discuss any questions you have with your health care provider.   Document Released: 10/26/2010 Document Revised: 05/03/2014 Document Reviewed: 03/14/2013 Elsevier Interactive Patient Education [employer].

## 2015-02-11 NOTE — Assessment & Plan Note (Signed)
General medical exam normal today including breast exam. PAP and pelvic deferred as s/p hysterectomy. Mammogram today. Colonoscopy UTD. Labs today. Immunizations are UTD. Encouraged healthy diet and exercise.

## 2015-02-11 NOTE — Progress Notes (Signed)
Subjective:    Patient ID: Nichole Jensen, female    DOB: 11-16-1960, 54 y.o.   MRN: [id number]  HPI  54YO female presents for physical exam.   Thursday developed runny nose. Itchy watery eyes. No fever, chills. Some dry raspy cough. No dyspnea. Cough non-productive. Notably her brother, aged 47 has stage 4 lung cancer.  Mammogram scheduled today.  Wt Readings from Last 3 Encounters:  02/11/15 200 lb 2 oz (90.776 kg)  10/18/14 199 lb 4 oz (90.379 kg)  01/01/14 194 lb 4 oz (88.111 kg)   BP Readings from Last 3 Encounters:  02/11/15 128/78  10/18/14 109/74  01/01/14 122/82    Past Medical History  Diagnosis Date  . Hyperlipidemia   . Hypertension   . Allergy    Family History  Problem Relation Age of Onset  . Heart disease Father   . Heart disease Paternal Uncle   . Heart disease Paternal Grandmother   . Heart disease Paternal Grandfather   . Diabetes Sister   . Hypertension Sister   . Diabetes Brother   . Hypertension Brother   . Cancer Brother 12    lung, non-smoker  . Colon cancer Neg Hx   . Stomach cancer Neg Hx    Past Surgical History  Procedure Laterality Date  . Breast surgery  2000    cyst 2000; right; benign  . Tonsillectomy  1968  . Abdominal hysterectomy  2005    endometriosis and adenomyosis, Dr. Rayford Halsted   Social History   Social History  . Marital Status: Divorced    Spouse Name: N/A  . Number of Children: N/A  . Years of Education: N/A   Social History Main Topics  . Smoking status: Never Smoker   . Smokeless tobacco: Never Used  . Alcohol Use: Yes     Comment: rare  . Drug Use: No  . Sexual Activity: Not Asked   Other Topics Concern  . None   Social History Narrative    Review of Systems  Constitutional: Negative for fever, chills, appetite change, fatigue and unexpected weight change.  Eyes: Negative for visual disturbance.  Respiratory: Positive for cough. Negative for chest tightness, shortness of breath and wheezing.    Cardiovascular: Negative for chest pain and leg swelling.  Gastrointestinal: Negative for nausea, vomiting, abdominal pain, diarrhea, constipation and blood in stool.  Musculoskeletal: Negative for myalgias and arthralgias.  Skin: Negative for color change and rash.  Hematological: Negative for adenopathy. Does not bruise/bleed easily.  Psychiatric/Behavioral: Negative for sleep disturbance and dysphoric mood. The patient is not nervous/anxious.        Objective:    BP 128/78 mmHg  Pulse 78  Temp(Src) 97.7 F (36.5 C) (Oral)  Ht 5' 2.25" (1.581 m)  Wt 200 lb 2 oz (90.776 kg)  BMI 36.32 kg/m2  SpO2 96% Physical Exam  Constitutional: She is oriented to person, place, and time. She appears well-developed and well-nourished. No distress.  HENT:  Head: Normocephalic and atraumatic.  Right Ear: External ear normal.  Left Ear: External ear normal.  Nose: Nose normal.  Mouth/Throat: Oropharynx is clear and moist. No oropharyngeal exudate.  Eyes: Conjunctivae are normal. Pupils are equal, round, and reactive to light. Right eye exhibits no discharge. Left eye exhibits no discharge. No scleral icterus.  Neck: Normal range of motion. Neck supple. No tracheal deviation present. No thyromegaly present.  Cardiovascular: Normal rate, regular rhythm, normal heart sounds and intact distal pulses.  Exam reveals no gallop and  no friction rub.   No murmur heard. Pulmonary/Chest: Effort normal and breath sounds normal. No accessory muscle usage. No tachypnea. No respiratory distress. She has no decreased breath sounds. She has no wheezes. She has no rales. She exhibits no tenderness. Right breast exhibits no inverted nipple, no mass, no nipple discharge, no skin change and no tenderness. Left breast exhibits no inverted nipple, no mass, no nipple discharge, no skin change and no tenderness. Breasts are symmetrical.  Abdominal: Soft. Bowel sounds are normal. She exhibits no distension and no mass. There  is no tenderness. There is no rebound and no guarding.  Musculoskeletal: Normal range of motion. She exhibits no edema or tenderness.  Lymphadenopathy:    She has no cervical adenopathy.  Neurological: She is alert and oriented to person, place, and time. No cranial nerve deficit. She exhibits normal muscle tone. Coordination normal.  Skin: Skin is warm and dry. No rash noted. She is not diaphoretic. No erythema. No pallor.  Psychiatric: She has a normal mood and affect. Her behavior is normal. Judgment and thought content normal.          Assessment & Plan:   Problem List Items Addressed This Visit      Unprioritized   Cough    Recent cough likely related to allergies, however given family h/o lung cancer will get CXR for evaluation.       Relevant Orders   DG Chest 2 View   Family history of lung cancer   Routine general medical examination at a health care facility - Primary    General medical exam normal today including breast exam. PAP and pelvic deferred as s/p hysterectomy. Mammogram today. Colonoscopy UTD. Labs today. Immunizations are UTD. Encouraged healthy diet and exercise.      Relevant Orders   CBC with Differential/Platelet   Comprehensive metabolic panel   Lipid panel   Microalbumin / creatinine urine ratio   Vit D  25 hydroxy (rtn osteoporosis monitoring)   TSH   Hemoglobin A1c       Return in about 6 months (around 08/12/2015) for Recheck.

## 2015-02-11 NOTE — Progress Notes (Signed)
Pre visit review using our clinic review tool, if applicable. No additional management support is needed unless otherwise documented below in the visit note. 

## 2015-02-11 NOTE — Assessment & Plan Note (Signed)
Recent cough likely related to allergies, however given family h/o lung cancer will get CXR for evaluation.

## 2015-02-11 NOTE — Addendum Note (Signed)
Addended by: Nanci Pina on: 02/11/2015 10:16 AM   Modules accepted: Miquel Dunn

## 2015-07-16 ENCOUNTER — Ambulatory Visit (INDEPENDENT_AMBULATORY_CARE_PROVIDER_SITE_OTHER): Payer: BC Managed Care – PPO | Admitting: Internal Medicine

## 2015-07-16 ENCOUNTER — Encounter: Payer: Self-pay | Admitting: Internal Medicine

## 2015-07-16 VITALS — BP 141/71 | HR 77 | Temp 98.6°F | Ht 62.0 in | Wt 202.5 lb

## 2015-07-16 DIAGNOSIS — N644 Mastodynia: Secondary | ICD-10-CM | POA: Diagnosis not present

## 2015-07-16 DIAGNOSIS — N63 Unspecified lump in unspecified breast: Secondary | ICD-10-CM

## 2015-07-16 NOTE — Progress Notes (Signed)
Pre visit review using our clinic review tool, if applicable. No additional management support is needed unless otherwise documented below in the visit note. 

## 2015-07-16 NOTE — Progress Notes (Signed)
Subjective:    Patient ID: Nichole Jensen, female    DOB: Mar 10, 1961, 55 y.o.   MRN: [id number]  HPI  55YO female presents for left breast pain.  Last week, noted left breast soreness. Symptoms worsened over weekend. Breast exam abnormal with firmness under nipple. No known trauma to breast. No discharge from nipple or redness of skin. Abnormal mammogram in 2001. Had fluid drawn off cystic area. Then, had biopsy of right breast, all benign tissue. 2005 was released to yearly mammogram. No family history of breast cancer.   Wt Readings from Last 3 Encounters:  07/16/15 202 lb 8 oz (91.853 kg)  02/11/15 200 lb 2 oz (90.776 kg)  10/18/14 199 lb 4 oz (90.379 kg)   BP Readings from Last 3 Encounters:  07/16/15 141/71  02/11/15 128/78  10/18/14 109/74    Past Medical History  Diagnosis Date  . Hyperlipidemia   . Hypertension   . Allergy    Family History  Problem Relation Age of Onset  . Heart disease Father   . Heart disease Paternal Uncle   . Heart disease Paternal Grandmother   . Heart disease Paternal Grandfather   . Diabetes Sister   . Hypertension Sister   . Diabetes Brother   . Hypertension Brother   . Cancer Brother 33    lung, non-smoker  . Colon cancer Neg Hx   . Stomach cancer Neg Hx   . Breast cancer Neg Hx    Past Surgical History  Procedure Laterality Date  . Breast surgery  2000    cyst 2000; right; benign  . Tonsillectomy  1968  . Abdominal hysterectomy  2005    endometriosis and adenomyosis, Dr. Rayford Halsted  . Breast cyst aspiration Right 2001    NEG  . Breast biopsy Right 2001    CORE W/CLIP - NEG   Social History   Social History  . Marital Status: Divorced    Spouse Name: N/A  . Number of Children: N/A  . Years of Education: N/A   Social History Main Topics  . Smoking status: Never Smoker   . Smokeless tobacco: Never Used  . Alcohol Use: Yes     Comment: rare  . Drug Use: No  . Sexual Activity: Not Asked   Other Topics Concern    . None   Social History Narrative    Review of Systems  Constitutional: Negative for fever, chills, appetite change, fatigue and unexpected weight change.  Eyes: Negative for visual disturbance.  Respiratory: Negative for shortness of breath.   Cardiovascular: Positive for chest pain (breast pain). Negative for leg swelling.  Gastrointestinal: Negative for abdominal pain.  Skin: Negative for color change and rash.  Hematological: Negative for adenopathy. Does not bruise/bleed easily.  Psychiatric/Behavioral: Negative for dysphoric mood. The patient is not nervous/anxious.        Objective:    BP 141/71 mmHg  Pulse 77  Temp(Src) 98.6 F (37 C) (Oral)  Ht 5\' 2"  (1.575 m)  Wt 202 lb 8 oz (91.853 kg)  BMI 37.03 kg/m2  SpO2 95% Physical Exam  Constitutional: She is oriented to person, place, and time. She appears well-developed and well-nourished. No distress.  HENT:  Head: Normocephalic and atraumatic.  Right Ear: External ear normal.  Left Ear: External ear normal.  Nose: Nose normal.  Mouth/Throat: Oropharynx is clear and moist.  Eyes: Conjunctivae are normal. Pupils are equal, round, and reactive to light. Right eye exhibits no discharge. Left eye exhibits no discharge.  No scleral icterus.  Neck: Normal range of motion. Neck supple. No tracheal deviation present. No thyromegaly present.  Pulmonary/Chest: Effort normal. No accessory muscle usage. No tachypnea. She has no decreased breath sounds. She has no rhonchi. Right breast exhibits mass. Right breast exhibits no inverted nipple, no nipple discharge, no skin change and no tenderness. Left breast exhibits mass and tenderness. Left breast exhibits no inverted nipple, no nipple discharge and no skin change. Breasts are symmetrical.    Musculoskeletal: Normal range of motion. She exhibits no edema or tenderness.  Lymphadenopathy:    She has no cervical adenopathy.  Neurological: She is alert and oriented to person, place,  and time. No cranial nerve deficit. She exhibits normal muscle tone. Coordination normal.  Skin: Skin is warm and dry. No rash noted. She is not diaphoretic. No erythema. No pallor.  Psychiatric: She has a normal mood and affect. Her behavior is normal. Judgment and thought content normal.          Assessment & Plan:   Problem List Items Addressed This Visit      Unprioritized   Breast nodule    Bilateral, most c/w fibrocystic changes. Will get bilateral diagnostic mammogram and [place].      Relevant Orders   MM Digital Diagnostic Bilat   US BREAST LTD UNI RIGHT INC AXILLA   Breast pain, left - Primary    Left breast pain over last several days. Few palpable nodular areas. Suspect fibrocystic changes. Will get diagnostic mammogram and [place] for evaluation. Follow up after imaging.      Relevant Orders   US BREAST LTD UNI LEFT INC AXILLA       Return in about 2 weeks (around 07/30/2015) for Recheck.  Ronette Deter, MD Internal Medicine [facility]

## 2015-07-16 NOTE — Assessment & Plan Note (Signed)
Left breast pain over last several days. Few palpable nodular areas. Suspect fibrocystic changes. Will get diagnostic mammogram and [place] for evaluation. Follow up after imaging.

## 2015-07-16 NOTE — Assessment & Plan Note (Signed)
Bilateral, most c/w fibrocystic changes. Will get bilateral diagnostic mammogram and [place].

## 2015-07-16 NOTE — Patient Instructions (Signed)
We will set up bilateral breast mammogram and ultrasound.   Follow up after imaging.

## 2015-07-21 ENCOUNTER — Ambulatory Visit
Admission: RE | Admit: 2015-07-21 | Discharge: 2015-07-21 | Disposition: A | Discharge status: Home / Self Care | Payer: BC Managed Care – PPO | Source: Ambulatory Visit | Attending: Internal Medicine | Admitting: Internal Medicine

## 2015-07-21 ENCOUNTER — Other Ambulatory Visit: Payer: Self-pay | Admitting: Internal Medicine

## 2015-07-21 DIAGNOSIS — N63 Unspecified lump in unspecified breast: Secondary | ICD-10-CM

## 2015-07-21 DIAGNOSIS — N644 Mastodynia: Secondary | ICD-10-CM | POA: Insufficient documentation

## 2015-08-01 ENCOUNTER — Other Ambulatory Visit: Payer: BC Managed Care – PPO

## 2015-08-01 ENCOUNTER — Ambulatory Visit: Payer: BC Managed Care – PPO

## 2015-08-06 ENCOUNTER — Encounter: Payer: Self-pay | Admitting: Internal Medicine

## 2015-08-06 ENCOUNTER — Ambulatory Visit (INDEPENDENT_AMBULATORY_CARE_PROVIDER_SITE_OTHER): Payer: BC Managed Care – PPO | Admitting: Internal Medicine

## 2015-08-06 VITALS — BP 131/73 | HR 76 | Temp 98.0°F | Ht 62.0 in | Wt 203.5 lb

## 2015-08-06 DIAGNOSIS — N63 Unspecified lump in unspecified breast: Secondary | ICD-10-CM

## 2015-08-06 DIAGNOSIS — T148 Other injury of unspecified body region: Secondary | ICD-10-CM | POA: Diagnosis not present

## 2015-08-06 DIAGNOSIS — W57XXXA Bitten or stung by nonvenomous insect and other nonvenomous arthropods, initial encounter: Secondary | ICD-10-CM | POA: Insufficient documentation

## 2015-08-06 MED ORDER — DOXYCYCLINE HYCLATE 100 MG PO TABS: 100.0000 mg | ORAL_TABLET | Freq: Two times a day (BID) | ORAL | Status: DC

## 2015-08-06 NOTE — Assessment & Plan Note (Signed)
Recent tick bite. Discussed risks of RMSF and similar tick borne illness. Will start Doxycycline 100mg  po bid x 14 days. Return precautions given.

## 2015-08-06 NOTE — Assessment & Plan Note (Signed)
Left breast nodular area most c/w fibrocystic changes, however strong family h/o breast cancer. Mammogram and [place] were normal. Will set up evaluation with Dr. Barry Dienes in General Surgery. Question if biopsy of nodular area would be helpful.

## 2015-08-06 NOTE — Patient Instructions (Signed)
We will set up referral to Dr. Barry Dienes in breast surgery for evaluation of left breast nodular area.  Start Doxycyline 100mg  twice daily for tick borne illness.  Follow up as needed.

## 2015-08-06 NOTE — Progress Notes (Signed)
Pre visit review using our clinic review tool, if applicable. No additional management support is needed unless otherwise documented below in the visit note. 

## 2015-08-06 NOTE — Progress Notes (Signed)
Subjective:    Patient ID: Nichole Jensen, female    DOB: 06-Aug-1960, 55 y.o.   MRN: [id number]  HPI  55YO female presents for follow up.  Recently seen for breast pain. Mammogram was normal. [place] was normal. Pain has improved with changing preparation of tea she is drinking. Continues to have a nodular area in medial left breast.  Sunday, noticed a tick on her buttock. Removed tick. No fever, chills, headache or rash. However concerned because of prevalence of Lyme in the area of [place] she was in.   Wt Readings from Last 3 Encounters:  08/06/15 203 lb 8 oz (92.307 kg)  07/16/15 202 lb 8 oz (91.853 kg)  02/11/15 200 lb 2 oz (90.776 kg)   BP Readings from Last 3 Encounters:  08/06/15 131/73  07/16/15 141/71  02/11/15 128/78    Past Medical History  Diagnosis Date  . Hyperlipidemia   . Hypertension   . Allergy    Family History  Problem Relation Age of Onset  . Heart disease Father   . Heart disease Paternal Uncle   . Heart disease Paternal Grandmother   . Heart disease Paternal Grandfather   . Diabetes Sister   . Hypertension Sister   . Diabetes Brother   . Hypertension Brother   . Cancer Brother 85    lung, non-smoker  . Colon cancer Neg Hx   . Stomach cancer Neg Hx   . Breast cancer Neg Hx    Past Surgical History  Procedure Laterality Date  . Breast surgery  2000    cyst 2000; right; benign  . Tonsillectomy  1968  . Abdominal hysterectomy  2005    endometriosis and adenomyosis, Dr. Rayford Halsted  . Breast biopsy Right 2001    CORE W/CLIP - NEG  . Breast cyst aspiration Right 2001    NEG   Social History   Social History  . Marital Status: Divorced    Spouse Name: N/A  . Number of Children: N/A  . Years of Education: N/A   Social History Main Topics  . Smoking status: Never Smoker   . Smokeless tobacco: Never Used  . Alcohol Use: Yes     Comment: rare  . Drug Use: No  . Sexual Activity: Not Asked   Other Topics Concern  . None   Social  History Narrative    Review of Systems  Constitutional: Negative for fever, chills, appetite change, fatigue and unexpected weight change.  Eyes: Negative for visual disturbance.  Respiratory: Negative for shortness of breath.   Cardiovascular: Negative for chest pain and leg swelling.  Gastrointestinal: Negative for nausea, vomiting, abdominal pain, diarrhea and constipation.  Musculoskeletal: Negative for myalgias.  Skin: Negative for color change and rash.  Neurological: Negative for headaches.  Hematological: Negative for adenopathy. Does not bruise/bleed easily.  Psychiatric/Behavioral: Negative for sleep disturbance and dysphoric mood. The patient is not nervous/anxious.        Objective:    BP 131/73 mmHg  Pulse 76  Temp(Src) 98 F (36.7 C) (Oral)  Ht 5\' 2"  (1.575 m)  Wt 203 lb 8 oz (92.307 kg)  BMI 37.21 kg/m2  SpO2 95% Physical Exam  Constitutional: She is oriented to person, place, and time. She appears well-developed and well-nourished. No distress.  HENT:  Head: Normocephalic and atraumatic.  Right Ear: External ear normal.  Left Ear: External ear normal.  Nose: Nose normal.  Mouth/Throat: Oropharynx is clear and moist. No oropharyngeal exudate.  Eyes: Conjunctivae are normal.  Pupils are equal, round, and reactive to light. Right eye exhibits no discharge. Left eye exhibits no discharge. No scleral icterus.  Neck: Normal range of motion. Neck supple. No tracheal deviation present. No thyromegaly present.  Cardiovascular: Normal rate, regular rhythm, normal heart sounds and intact distal pulses.  Exam reveals no gallop and no friction rub.   No murmur heard. Pulmonary/Chest: Effort normal and breath sounds normal. No respiratory distress. She has no wheezes. She has no rales. She exhibits no tenderness. Right breast exhibits no inverted nipple, no mass, no nipple discharge, no skin change and no tenderness. Left breast exhibits mass and tenderness. Left breast  exhibits no inverted nipple, no nipple discharge and no skin change.    Musculoskeletal: Normal range of motion. She exhibits no edema or tenderness.  Lymphadenopathy:    She has no cervical adenopathy.  Neurological: She is alert and oriented to person, place, and time. No cranial nerve deficit. She exhibits normal muscle tone. Coordination normal.  Skin: Skin is warm and dry. No rash noted. She is not diaphoretic. No erythema. No pallor.  Psychiatric: She has a normal mood and affect. Her behavior is normal. Judgment and thought content normal.          Assessment & Plan:   Problem List Items Addressed This Visit      Unprioritized   Breast nodule - Primary    Left breast nodular area most c/w fibrocystic changes, however strong family h/o breast cancer. Mammogram and [place] were normal. Will set up evaluation with Dr. Barry Dienes in General Surgery. Question if biopsy of nodular area would be helpful.      Relevant Orders   Ambulatory referral to General Surgery   Tick bite    Recent tick bite. Discussed risks of RMSF and similar tick borne illness. Will start Doxycycline 100mg  po bid x 14 days. Return precautions given.      Relevant Medications   doxycycline (VIBRA-TABS) 100 MG tablet       Return if symptoms worsen or fail to improve.  Ronette Deter, MD Internal Medicine [facility]

## 2015-11-20 ENCOUNTER — Ambulatory Visit (INDEPENDENT_AMBULATORY_CARE_PROVIDER_SITE_OTHER): Payer: BC Managed Care – PPO | Admitting: Internal Medicine

## 2015-11-20 ENCOUNTER — Other Ambulatory Visit: Payer: Self-pay | Admitting: Internal Medicine

## 2015-11-20 ENCOUNTER — Encounter: Payer: Self-pay | Admitting: Internal Medicine

## 2015-11-20 VITALS — BP 126/88 | HR 71 | Ht 63.0 in | Wt 204.6 lb

## 2015-11-20 DIAGNOSIS — J302 Other seasonal allergic rhinitis: Secondary | ICD-10-CM | POA: Diagnosis not present

## 2015-11-20 DIAGNOSIS — I10 Essential (primary) hypertension: Secondary | ICD-10-CM

## 2015-11-20 MED ORDER — MONTELUKAST SODIUM 10 MG PO TABS: 10.0000 mg | ORAL_TABLET | Freq: Every day | ORAL | 3 refills | Status: DC

## 2015-11-20 NOTE — Assessment & Plan Note (Signed)
Worsening allergy symptoms. Will add Singulair to Claritin. Continue prn Albuterol. Follow up if symptoms are not improving.

## 2015-11-20 NOTE — Progress Notes (Signed)
Pre visit review using our clinic review tool, if applicable. No additional management support is needed unless otherwise documented below in the visit note. 

## 2015-11-20 NOTE — Patient Instructions (Addendum)
Start Singulair daily to help with allergy symptoms.  Continue Claritin.  Follow up if symptoms are not improving.

## 2015-11-20 NOTE — Progress Notes (Signed)
Subjective:    Patient ID: Nichole Jensen, female    DOB: 1960/08/22, 55 y.o.   MRN: [id number]  HPI  55YO female presents for follow up.  Allergies - Taking Claritin every day. If misses a day, has congestion, chest tightness and wheezing. Has not recently used Albuterol. Feels that humidity has made allergy symptoms worse this year.   Wt Readings from Last 3 Encounters:  11/20/15 204 lb 9.6 oz (92.8 kg)  08/06/15 203 lb 8 oz (92.3 kg)  07/16/15 202 lb 8 oz (91.9 kg)   BP Readings from Last 3 Encounters:  11/20/15 126/88  08/06/15 131/73  07/16/15 (!) 141/71    Past Medical History:  Diagnosis Date  . Allergy   . Hyperlipidemia   . Hypertension    Family History  Problem Relation Age of Onset  . Heart disease Father   . Heart disease Paternal Uncle   . Heart disease Paternal Grandmother   . Heart disease Paternal Grandfather   . Diabetes Sister   . Hypertension Sister   . Diabetes Brother   . Hypertension Brother   . Cancer Brother 57    lung, non-smoker  . Colon cancer Neg Hx   . Stomach cancer Neg Hx   . Breast cancer Neg Hx    Past Surgical History:  Procedure Laterality Date  . ABDOMINAL HYSTERECTOMY  2005   endometriosis and adenomyosis, Dr. Rayford Halsted  . BREAST BIOPSY Right 2001   CORE W/CLIP - NEG  . BREAST CYST ASPIRATION Right 2001   NEG  . BREAST SURGERY  2000   cyst 2000; right; benign  . TONSILLECTOMY  1968   Social History   Social History  . Marital status: Divorced    Spouse name: N/A  . Number of children: N/A  . Years of education: N/A   Social History Main Topics  . Smoking status: Never Smoker  . Smokeless tobacco: Never Used  . Alcohol use Yes     Comment: rare  . Drug use: No  . Sexual activity: Not Asked   Other Topics Concern  . None   Social History Narrative  . None    Review of Systems  Constitutional: Negative for chills, fatigue, fever and unexpected weight change.  HENT: Positive for congestion, postnasal  drip, rhinorrhea and sneezing. Negative for ear discharge, ear pain, facial swelling, hearing loss, mouth sores, nosebleeds, sinus pressure, sore throat, tinnitus, trouble swallowing and voice change.   Eyes: Negative for pain, discharge, redness and visual disturbance.  Respiratory: Positive for cough, chest tightness and shortness of breath (intermittent). Negative for wheezing and stridor.   Cardiovascular: Negative for chest pain, palpitations and leg swelling.  Musculoskeletal: Negative for arthralgias, myalgias, neck pain and neck stiffness.  Skin: Negative for color change and rash.  Neurological: Negative for dizziness, weakness, light-headedness and headaches.  Hematological: Negative for adenopathy.       Objective:    BP 126/88 (BP Location: Right Arm, Patient Position: Sitting, Cuff Size: Large)   Pulse 71   Ht 5\' 3"  (1.6 m)   Wt 204 lb 9.6 oz (92.8 kg)   SpO2 98%   BMI 36.24 kg/m  Physical Exam  Constitutional: She is oriented to person, place, and time. She appears well-developed and well-nourished. No distress.  HENT:  Head: Normocephalic and atraumatic.  Right Ear: External ear normal.  Left Ear: External ear normal.  Nose: Nose normal.  Mouth/Throat: Oropharynx is clear and moist. No oropharyngeal exudate.  Eyes: Conjunctivae  are normal. Pupils are equal, round, and reactive to light. Right eye exhibits no discharge. Left eye exhibits no discharge. No scleral icterus.  Neck: Normal range of motion. Neck supple. No tracheal deviation present. No thyromegaly present.  Cardiovascular: Normal rate, regular rhythm, normal heart sounds and intact distal pulses.  Exam reveals no gallop and no friction rub.   No murmur heard. Pulmonary/Chest: Effort normal and breath sounds normal. No respiratory distress. She has no wheezes. She has no rales. She exhibits no tenderness.  Musculoskeletal: Normal range of motion. She exhibits no edema or tenderness.  Lymphadenopathy:    She  has no cervical adenopathy.  Neurological: She is alert and oriented to person, place, and time. No cranial nerve deficit. She exhibits normal muscle tone. Coordination normal.  Skin: Skin is warm and dry. No rash noted. She is not diaphoretic. No erythema. No pallor.  Psychiatric: She has a normal mood and affect. Her behavior is normal. Judgment and thought content normal.          Assessment & Plan:   Problem List Items Addressed This Visit      Unprioritized   Seasonal allergies - Primary    Worsening allergy symptoms. Will add Singulair to Claritin. Continue prn Albuterol. Follow up if symptoms are not improving.       Other Visit Diagnoses   None.      Return in about 3 months (around 02/12/2016) for New Patient.  Ronette Deter, MD Internal Medicine [facility]

## 2015-11-20 NOTE — Telephone Encounter (Signed)
Pt called about needing a refill for hydrochlorothiazide (MICROZIDE) 12.5 MG capsule. For 65yr.  Pharmacy is [facility], [address]   Call pt @ [phone]. Thank you!

## 2015-11-23 ENCOUNTER — Encounter: Payer: Self-pay | Admitting: Family Medicine

## 2016-01-12 ENCOUNTER — Other Ambulatory Visit: Payer: Self-pay | Admitting: General Surgery

## 2016-01-12 ENCOUNTER — Other Ambulatory Visit: Payer: Self-pay | Admitting: Internal Medicine

## 2016-01-12 DIAGNOSIS — Z1231 Encounter for screening mammogram for malignant neoplasm of breast: Secondary | ICD-10-CM

## 2016-02-12 ENCOUNTER — Ambulatory Visit: Payer: BC Managed Care – PPO | Admitting: Internal Medicine

## 2016-02-12 ENCOUNTER — Ambulatory Visit
Admission: RE | Admit: 2016-02-12 | Discharge: 2016-02-12 | Disposition: A | Discharge status: Home / Self Care | Payer: BC Managed Care – PPO | Source: Ambulatory Visit | Attending: General Surgery | Admitting: General Surgery

## 2016-02-12 ENCOUNTER — Ambulatory Visit: Payer: BC Managed Care – PPO | Admitting: Family Medicine

## 2016-02-12 ENCOUNTER — Ambulatory Visit (INDEPENDENT_AMBULATORY_CARE_PROVIDER_SITE_OTHER): Payer: BC Managed Care – PPO | Admitting: Internal Medicine

## 2016-02-12 ENCOUNTER — Ambulatory Visit (INDEPENDENT_AMBULATORY_CARE_PROVIDER_SITE_OTHER): Payer: BC Managed Care – PPO

## 2016-02-12 ENCOUNTER — Encounter: Payer: Self-pay | Admitting: Internal Medicine

## 2016-02-12 VITALS — BP 136/90 | HR 72 | Temp 98.3°F | Resp 10 | Ht 63.0 in | Wt 203.8 lb

## 2016-02-12 DIAGNOSIS — R05 Cough: Secondary | ICD-10-CM | POA: Diagnosis not present

## 2016-02-12 DIAGNOSIS — R635 Abnormal weight gain: Secondary | ICD-10-CM

## 2016-02-12 DIAGNOSIS — I10 Essential (primary) hypertension: Secondary | ICD-10-CM | POA: Diagnosis not present

## 2016-02-12 DIAGNOSIS — R7303 Prediabetes: Secondary | ICD-10-CM

## 2016-02-12 DIAGNOSIS — R059 Cough, unspecified: Secondary | ICD-10-CM

## 2016-02-12 DIAGNOSIS — R5383 Other fatigue: Secondary | ICD-10-CM

## 2016-02-12 DIAGNOSIS — N63 Unspecified lump in unspecified breast: Secondary | ICD-10-CM

## 2016-02-12 DIAGNOSIS — E669 Obesity, unspecified: Secondary | ICD-10-CM

## 2016-02-12 DIAGNOSIS — E782 Mixed hyperlipidemia: Secondary | ICD-10-CM | POA: Diagnosis not present

## 2016-02-12 DIAGNOSIS — Z1231 Encounter for screening mammogram for malignant neoplasm of breast: Secondary | ICD-10-CM

## 2016-02-12 DIAGNOSIS — E559 Vitamin D deficiency, unspecified: Secondary | ICD-10-CM

## 2016-02-12 LAB — LIPID PANEL
Cholesterol: 155 mg/dL (ref 0–200)
HDL: 47.8 mg/dL (ref >39.00)
LDL Cholesterol: 83 mg/dL (ref 0–99)
NONHDL: 106.78
Total CHOL/HDL Ratio: 3
Triglycerides: 119 mg/dL (ref 0.0–149.0)
VLDL: 23.8 mg/dL (ref 0.0–40.0)

## 2016-02-12 LAB — CBC WITH DIFFERENTIAL/PLATELET
BASOS ABS: 0 10*3/uL (ref 0.0–0.1)
BASOS PCT: 0.4 % (ref 0.0–3.0)
EOS ABS: 0 10*3/uL (ref 0.0–0.7)
Eosinophils Relative: 0.6 % (ref 0.0–5.0)
HEMATOCRIT: 40.6 % (ref 36.0–46.0)
Hemoglobin: 13.6 g/dL (ref 12.0–15.0)
LYMPHS ABS: 2 10*3/uL (ref 0.7–4.0)
Lymphocytes Relative: 29.2 % (ref 12.0–46.0)
MCHC: 33.7 g/dL (ref 30.0–36.0)
MCV: 83.3 fl (ref 78.0–100.0)
MONO ABS: 0.4 10*3/uL (ref 0.1–1.0)
Monocytes Relative: 5.7 % (ref 3.0–12.0)
NEUTROS ABS: 4.4 10*3/uL (ref 1.4–7.7)
NEUTROS PCT: 64.1 % (ref 43.0–77.0)
PLATELETS: 224 10*3/uL (ref 150.0–400.0)
RBC: 4.87 Mil/uL (ref 3.87–5.11)
RDW: 13.1 % (ref 11.5–15.5)
WBC: 6.9 10*3/uL (ref 4.0–10.5)

## 2016-02-12 LAB — COMPREHENSIVE METABOLIC PANEL
ALBUMIN: 4.3 g/dL (ref 3.5–5.2)
ALT: 15 U/L (ref 0–35)
AST: 14 U/L (ref 0–37)
Alkaline Phosphatase: 89 U/L (ref 39–117)
BILIRUBIN TOTAL: 0.3 mg/dL (ref 0.2–1.2)
BUN: 16 mg/dL (ref 6–23)
CO2: 26 mEq/L (ref 19–32)
CREATININE: 0.84 mg/dL (ref 0.40–1.20)
Calcium: 9.5 mg/dL (ref 8.4–10.5)
Chloride: 107 mEq/L (ref 96–112)
GFR: 74.67 mL/min (ref >60.00)
GLUCOSE: 106 mg/dL — AB (ref 70–99)
Potassium: 4.1 mEq/L (ref 3.5–5.1)
SODIUM: 142 meq/L (ref 135–145)
TOTAL PROTEIN: 7 g/dL (ref 6.0–8.3)

## 2016-02-12 LAB — LDL CHOLESTEROL, DIRECT: LDL DIRECT: 86 mg/dL

## 2016-02-12 LAB — VITAMIN D 25 HYDROXY (VIT D DEFICIENCY, FRACTURES): VITD: 29.4 ng/mL — AB (ref 30.00–100.00)

## 2016-02-12 LAB — TSH: TSH: 2.02 u[IU]/mL (ref 0.35–4.50)

## 2016-02-12 LAB — MICROALBUMIN / CREATININE URINE RATIO
CREATININE, U: 127.5 mg/dL
MICROALB UR: 3.3 mg/dL — AB (ref 0.0–1.9)
Microalb Creat Ratio: 2.6 mg/g (ref 0.0–30.0)

## 2016-02-12 LAB — HEMOGLOBIN A1C: HEMOGLOBIN A1C: 6.3 % (ref 4.6–6.5)

## 2016-02-12 MED ORDER — MONTELUKAST SODIUM 10 MG PO TABS: 10.0000 mg | ORAL_TABLET | Freq: Every day | ORAL | 3 refills | Status: DC

## 2016-02-12 NOTE — Progress Notes (Signed)
Subjective:  Patient ID: Nichole Jensen, female    DOB: 07/14/1960  Age: 55 y.o. MRN: [id number]  CC: The primary encounter diagnosis was Prediabetes. Diagnoses of Weight gain, Fatigue, unspecified type, Cough, Essential hypertension, Vitamin D deficiency, Mixed hyperlipidemia, Obesity (BMI 30-39.9), and Breast nodule were also pertinent to this visit.  HPI Nichole Jensen presents for transition of care from Dr Gilford Rile.  Last seen in July , and Singulair was added daily to claritin.  Symptoms have resolved and she has suspended daily use    Currently concerned about her weight gain.  Used to be hysically active prior to hysterectomy at age  Working 35 to 16 hours daily , job is sedentary. Nighttime meetings several nights per week so eats at home rarely,  .  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 Usually skips breakfast and lunch.  Had breast nodules and biopises in 2001,  All benign ,  Dr Barry Dienes  Her younger brother younger was diagnosed with lung CA that metastasized to  bone and brain .  He was a nonsmoker.     Outpatient Medications Prior to Visit  Medication Sig Dispense Refill  . albuterol (PROVENTIL HFA;VENTOLIN HFA) 108 (90 BASE) MCG/ACT inhaler Inhale 2 puffs into the lungs every 6 (six) hours as needed for wheezing or shortness of breath. 1 Inhaler 0  . aspirin 81 MG tablet Take 81 mg by mouth daily.      Marland Kitchen b complex vitamins tablet Take 1 tablet by mouth daily.    . hydrochlorothiazide (MICROZIDE) 12.5 MG capsule TAKE ONE CAPSULE EVERY MORNING 90 capsule 3  . loratadine (CLARITIN) 10 MG tablet Take 10 mg by mouth daily.    . Multiple Vitamin (MULTIVITAMIN) tablet Take 1 tablet by mouth daily.    . montelukast (SINGULAIR) 10 MG tablet Take 1 tablet (10 mg total) by mouth at bedtime. 30 tablet 3   No facility-administered medications prior to visit.     Review of Systems;  Patient denies headache, fevers, malaise, unintentional weight loss, skin  rash, eye pain, sinus congestion and sinus pain, sore throat, dysphagia,  hemoptysis , cough, dyspnea, wheezing, chest pain, palpitations, orthopnea, edema, abdominal pain, nausea, melena, diarrhea, constipation, flank pain, dysuria, hematuria, urinary  Frequency, nocturia, numbness, tingling, seizures,  Focal weakness, Loss of consciousness,  Tremor, insomnia, depression, anxiety, and suicidal ideation.      Objective:  BP 136/90   Pulse 72   Temp 98.3 F (36.8 C) (Oral)   Resp 10   Ht 5\' 3"  (1.6 m)   Wt 203 lb 12 oz (92.4 kg)   SpO2 96%   BMI 36.09 kg/m   BP Readings from Last 3 Encounters:  02/12/16 136/90  11/20/15 126/88  08/06/15 131/73    Wt Readings from Last 3 Encounters:  02/12/16 203 lb 12 oz (92.4 kg)  11/20/15 204 lb 9.6 oz (92.8 kg)  08/06/15 203 lb 8 oz (92.3 kg)    General appearance: alert, cooperative and appears stated age Ears: normal TM's and external ear canals both ears Throat: lips, mucosa, and tongue normal; teeth and gums normal Neck: no adenopathy, no carotid bruit, supple, symmetrical, trachea midline and thyroid not enlarged, symmetric, no tenderness/mass/nodules Back: symmetric, no curvature. ROM normal. No CVA tenderness. Lungs: clear to auscultation bilaterally Heart: regular rate and rhythm, S1, S2 normal, no murmur, click, rub or gallop Abdomen: soft, non-tender; bowel sounds normal; no masses,  no organomegaly Pulses: 2+ and symmetric Skin: Skin color, texture,  turgor normal. No rashes or lesions Lymph nodes: Cervical, supraclavicular, and axillary nodes normal.  Lab Results  Component Value Date   HGBA1C 6.3 02/12/2016   HGBA1C 6.3 02/11/2015   HGBA1C 6.1 04/26/2011    Lab Results  Component Value Date   CREATININE 0.84 02/12/2016   CREATININE 0.77 02/11/2015   CREATININE 0.9 01/01/2014    Lab Results  Component Value Date   WBC 6.9 02/12/2016   HGB 13.6 02/12/2016   HCT 40.6 02/12/2016   PLT 224.0 02/12/2016   GLUCOSE  106 (H) 02/12/2016   CHOL 155 02/12/2016   TRIG 119.0 02/12/2016   HDL 47.80 02/12/2016   LDLDIRECT 86.0 02/12/2016   LDLCALC 83 02/12/2016   ALT 15 02/12/2016   AST 14 02/12/2016   NA 142 02/12/2016   K 4.1 02/12/2016   CL 107 02/12/2016   CREATININE 0.84 02/12/2016   BUN 16 02/12/2016   CO2 26 02/12/2016   TSH 2.02 02/12/2016   HGBA1C 6.3 02/12/2016   MICROALBUR 3.3 (H) 02/12/2016    US Breast Ltd Uni Left Inc Axilla  Result Date: 07/21/2015 CLINICAL DATA:  Patient presents for a bilateral diagnostic examination due to focal pain for 10 days over the lower mid to inner left breast. Possible associated palpable area in this region of pain. EXAM: 2D DIGITAL DIAGNOSTIC bilateral MAMMOGRAM WITH CAD AND ADJUNCT TOMO ULTRASOUND left BREAST COMPARISON:  Previous exam(s). ACR Breast Density Category b: There are scattered areas of fibroglandular density. FINDINGS: Examination demonstrates no focal abnormality over the inner lower left breast to account for patient's palpable area and pain. Remainder of the exam is unchanged. Mammographic images were processed with CAD. On physical exam, I palpate no focal abnormality over the lower central left breast. Targeted ultrasound is performed, showing no focal abnormality over the lower central left breast to account for patient's pain and palpable area. IMPRESSION: No focal abnormality over the lower mid to inner left breast to account for patient's pain and palpable abnormality. RECOMMENDATION: Recommend continued management of patient's left breast pain and possible palpable abnormality on a clinical basis. Otherwise, recommend continued annual bilateral screening mammographic followup. I have discussed the findings and recommendations with the patient. Results were also provided in writing at the conclusion of the visit. If applicable, a reminder letter will be sent to the patient regarding the next appointment. BI-RADS CATEGORY  1: Negative.  Electronically Signed   By: Marin Olp M.D.   On: 07/21/2015 16:09   Mm Diag Breast Tomo Bilateral  Result Date: 07/21/2015 CLINICAL DATA:  Patient presents for a bilateral diagnostic examination due to focal pain for 10 days over the lower mid to inner left breast. Possible associated palpable area in this region of pain. EXAM: 2D DIGITAL DIAGNOSTIC bilateral MAMMOGRAM WITH CAD AND ADJUNCT TOMO ULTRASOUND left BREAST COMPARISON:  Previous exam(s). ACR Breast Density Category b: There are scattered areas of fibroglandular density. FINDINGS: Examination demonstrates no focal abnormality over the inner lower left breast to account for patient's palpable area and pain. Remainder of the exam is unchanged. Mammographic images were processed with CAD. On physical exam, I palpate no focal abnormality over the lower central left breast. Targeted ultrasound is performed, showing no focal abnormality over the lower central left breast to account for patient's pain and palpable area. IMPRESSION: No focal abnormality over the lower mid to inner left breast to account for patient's pain and palpable abnormality. RECOMMENDATION: Recommend continued management of patient's left breast pain and possible palpable  abnormality on a clinical basis. Otherwise, recommend continued annual bilateral screening mammographic followup. I have discussed the findings and recommendations with the patient. Results were also provided in writing at the conclusion of the visit. If applicable, a reminder letter will be sent to the patient regarding the next appointment. BI-RADS CATEGORY  1: Negative. Electronically Signed   By: Marin Olp M.D.   On: 07/21/2015 16:09    Assessment & Plan:   Problem List Items Addressed This Visit    Obesity (BMI 30-39.9)    I have spent greater than 50% of a 30 minute  visit addressing  BMI and diet.   recommended wt loss of 10% of body weigh over the next 6 months using a low glycemic index diet with  6 smaller meals daily  and regular exercise a minimum of 5 days per week. Screening for thyroid, diabetes and hyperlipidemia done.   Lab Results  Component Value Date   TSH 2.02 02/12/2016   Lab Results  Component Value Date   CHOL 155 02/12/2016   HDL 47.80 02/12/2016   LDLCALC 83 02/12/2016   LDLDIRECT 86.0 02/12/2016   TRIG 119.0 02/12/2016   CHOLHDL 3 02/12/2016   Lab Results  Component Value Date   HGBA1C 6.3 02/12/2016          Cough    Her nonproductive cough has returned.  Given her family history of lung Ca,  I ordered a plain chest x ray which was reassuring  For no nodules      Relevant Orders   DG Chest 2 View (Completed)   Breast nodule    Hierrandom glucose is again elevated but not diagnostic of diabetes .  I recommend she follow a low glycemic index diet and particpate regularly in an aerobic  exercise activity.  We should check an A1c in 3 months.   Lab Results  Component Value Date   HGBA1C 6.3 02/12/2016         Prediabetes - Primary   Relevant Orders   Comprehensive metabolic panel (Completed)   Hemoglobin A1c (Completed)   Hypertension   Relevant Orders   Microalbumin / creatinine urine ratio (Completed)    Other Visit Diagnoses    Weight gain       Fatigue, unspecified type       Relevant Orders   TSH (Completed)   CBC with Differential/Platelet (Completed)   Vitamin D deficiency       Relevant Orders   VITAMIN D 25 Hydroxy (Vit-D Deficiency, Fractures) (Completed)   Mixed hyperlipidemia       Relevant Orders   LDL cholesterol, direct (Completed)   Lipid panel (Completed)      I am having Ms. Molinari maintain her aspirin, multivitamin, albuterol, b complex vitamins, loratadine, hydrochlorothiazide, and montelukast.  Meds ordered this encounter  Medications  . montelukast (SINGULAIR) 10 MG tablet    Sig: Take 1 tablet (10 mg total) by mouth at bedtime.    Dispense:  90 tablet    Refill:  3    Medications Discontinued During  This Encounter  Medication Reason  . montelukast (SINGULAIR) 10 MG tablet Reorder    Follow-up: Return in about 6 months (around 08/12/2016).   Crecencio Mc, MD

## 2016-02-12 NOTE — Progress Notes (Signed)
Pre-visit discussion using our clinic review tool. No additional management support is needed unless otherwise documented below in the visit note.  

## 2016-02-12 NOTE — Patient Instructions (Addendum)
You will not lose weight if you skip meals, because your metabolism drops into neutral during fasting states  Givne your history of prediabetes,  The low glycemic index/Mediterranean lifestyle (NOT DIET) is advised.   Here are some convenient time savers :    try a premixed protein drink called Premier Protein shake for breakfast or late night snack . It is great tasting,   very low sugar and available of < $2 serving at [facility] and  In bulk for $1.50/serving at [employer] and [employer]  .    Nutritional analysis :  160 cal  30 g protein  1 g sugar 50% calcium needs   Vladimir Faster and [employer]   Danton Clap now makes a frozen breakfast frittata that can be microwaved in 2 minutes and is very low carb. Frittats are similar to quiches without the crust  .KIND Bars are available in low glycemic index varieties   WASA crackers are the best low carb   To make a low carb chip :  Take the Joseph's Lavash or Pita bread,  Or the [place] Low carb whole wheat tortilla   Place on metal cookie sheet  Brush with olive oil  Sprinkle garlic powder (NOT garlic salt), grated parmesan cheese, mediterranean seasoning , or all of them?  Bake at 275 for 30 minutes   We have substitutions for your potatoes!!  Try the mashed cauliflower and riced cauliflower dishes instead of rice and mashed potatoes [employer] tastes the best   Mashed turnips are also very low carb!   For desserts :  Try the Dannon Lt n Fit greek yogurt dessert flavors and top with reddi Whip .  8 carbs,  80 calories  Try Oikos Triple Zero [place] Yogurt in the salted caramel, and the coffee flavors  With Whipped Cream for dessert  breyer's low carb ice cream, available in bars (on a stick, better ) or scoopable ice cream  HERE ARE THE LOW CARB  BREAD CHOICES

## 2016-02-13 ENCOUNTER — Encounter: Payer: Self-pay | Admitting: Internal Medicine

## 2016-02-14 DIAGNOSIS — R7303 Prediabetes: Secondary | ICD-10-CM | POA: Insufficient documentation

## 2016-02-14 NOTE — Assessment & Plan Note (Signed)
Her nonproductive cough has returned.  Given her family history of lung Ca,  I ordered a plain chest x ray which was reassuring  For no nodules

## 2016-02-14 NOTE — Assessment & Plan Note (Signed)
Hierrandom glucose is again elevated but not diagnostic of diabetes .  I recommend she follow a low glycemic index diet and particpate regularly in an aerobic  exercise activity.  We should check an A1c in 3 months.   Lab Results  Component Value Date   HGBA1C 6.3 02/12/2016

## 2016-02-14 NOTE — Assessment & Plan Note (Addendum)
I have spent greater than 50% of a 30 minute  visit addressing  BMI and diet.   recommended wt loss of 10% of body weigh over the next 6 months using a low glycemic index diet with 6 smaller meals daily  and regular exercise a minimum of 5 days per week. Screening for thyroid, diabetes and hyperlipidemia done.   Lab Results  Component Value Date   TSH 2.02 02/12/2016   Lab Results  Component Value Date   CHOL 155 02/12/2016   HDL 47.80 02/12/2016   LDLCALC 83 02/12/2016   LDLDIRECT 86.0 02/12/2016   TRIG 119.0 02/12/2016   CHOLHDL 3 02/12/2016   Lab Results  Component Value Date   HGBA1C 6.3 02/12/2016

## 2016-02-15 ENCOUNTER — Encounter: Payer: Self-pay | Admitting: Internal Medicine

## 2016-02-19 ENCOUNTER — Ambulatory Visit: Payer: BC Managed Care – PPO

## 2016-04-15 LAB — HM DIABETES EYE EXAM

## 2016-05-05 ENCOUNTER — Telehealth: Payer: Self-pay | Admitting: Internal Medicine

## 2016-05-05 MED ORDER — PREDNISONE 10 MG PO TABS: ORAL_TABLET | ORAL | 0 refills | Status: DC

## 2016-05-05 MED ORDER — AMOXICILLIN-POT CLAVULANATE 875-125 MG PO TABS: 1.0000 | ORAL_TABLET | Freq: Two times a day (BID) | ORAL | 0 refills | Status: DC

## 2016-05-05 NOTE — Telephone Encounter (Signed)
Pt returned office phone call. Please call her at [phone].

## 2016-05-05 NOTE — Telephone Encounter (Signed)
Patient advised of below and verbalized understanding.   Faxed script to [place] per patients request.

## 2016-05-05 NOTE — Telephone Encounter (Signed)
Reason for call: congestion,  Symptoms  Head congestion, drainage this am small amount of blood, low grade fever this am 99 , dry cough, sore throat in am , facial pressure , no chest congestion Duration12/26 started with postnasal drip  Medications:none now, hasn't been on Claritin or Singulair , using vaporizer  Currently out of town (works of town)  Please advise ,on what you would recommend , she is not pushing for antibiotic .

## 2016-05-05 NOTE — Telephone Encounter (Signed)
Left message to call.

## 2016-05-05 NOTE — Telephone Encounter (Signed)
Given chronicity of symptoms, development of facial pain and  History of bloody nasal drainage.  She has a viral syndrome that has progressed to a bacterial sinutis, and should start an antibiotic.  I will call in an antibiotic THIS ONE TIME since she is out of town. . She should also take a prednisone taper, and use OTC saline nasal rinses and decongestants.  Will need to be seen oif no improvement  Next week    I will print the rx so you can send them to her out of town choice of pharmacy   Please take a probiotic ( Align, [place] or [place]), the generic version of one of these over the counter medications,  for a minimum of 3 weeks to prevent a serious antibiotic associated diarrhea  Called clostridium dificile colitis.  Taking a probiotic may also prevent vaginitis due to yeast infections and can be continued indefinitely if you feel that it improves your digestion or your elimination (bowels).

## 2016-05-05 NOTE — Telephone Encounter (Signed)
Pt called and stated that she is out of town on work and is c/o low grade fever, congestion, and discolored nasal discharge with blood in nose. Pt attempted e visit and did not work. Please advise, thank you!  Call pt @ [phone]

## 2016-05-17 ENCOUNTER — Telehealth: Payer: Self-pay | Admitting: Radiology

## 2016-05-17 DIAGNOSIS — R7303 Prediabetes: Secondary | ICD-10-CM

## 2016-05-17 DIAGNOSIS — I10 Essential (primary) hypertension: Secondary | ICD-10-CM

## 2016-05-17 NOTE — Telephone Encounter (Signed)
fasting labs and urine ordered

## 2016-05-17 NOTE — Telephone Encounter (Signed)
Pt coming for labs tomorrow, please place orders. Thank you.  

## 2016-05-17 NOTE — Addendum Note (Signed)
Addended by: Crecencio Mc on: 05/17/2016 01:10 PM   Modules accepted: Orders

## 2016-05-18 ENCOUNTER — Other Ambulatory Visit (INDEPENDENT_AMBULATORY_CARE_PROVIDER_SITE_OTHER): Payer: BC Managed Care – PPO

## 2016-05-18 DIAGNOSIS — I10 Essential (primary) hypertension: Secondary | ICD-10-CM

## 2016-05-18 DIAGNOSIS — R7303 Prediabetes: Secondary | ICD-10-CM | POA: Diagnosis not present

## 2016-05-18 LAB — COMPREHENSIVE METABOLIC PANEL
ALK PHOS: 98 U/L (ref 39–117)
ALT: 16 U/L (ref 0–35)
AST: 14 U/L (ref 0–37)
Albumin: 4.1 g/dL (ref 3.5–5.2)
BILIRUBIN TOTAL: 0.2 mg/dL (ref 0.2–1.2)
BUN: 17 mg/dL (ref 6–23)
CALCIUM: 9.2 mg/dL (ref 8.4–10.5)
CO2: 26 mEq/L (ref 19–32)
Chloride: 106 mEq/L (ref 96–112)
Creatinine, Ser: 0.78 mg/dL (ref 0.40–1.20)
GFR: 81.26 mL/min (ref >60.00)
Glucose, Bld: 111 mg/dL — ABNORMAL HIGH (ref 70–99)
Potassium: 3.6 mEq/L (ref 3.5–5.1)
Sodium: 139 mEq/L (ref 135–145)
TOTAL PROTEIN: 6.7 g/dL (ref 6.0–8.3)

## 2016-05-18 LAB — LIPID PANEL
CHOL/HDL RATIO: 4
Cholesterol: 170 mg/dL (ref 0–200)
HDL: 38.4 mg/dL — ABNORMAL LOW (ref >39.00)
LDL Cholesterol: 106 mg/dL — ABNORMAL HIGH (ref 0–99)
NonHDL: 131.74
Triglycerides: 127 mg/dL (ref 0.0–149.0)
VLDL: 25.4 mg/dL (ref 0.0–40.0)

## 2016-05-18 LAB — MICROALBUMIN / CREATININE URINE RATIO
CREATININE, U: 212.4 mg/dL
MICROALB UR: 14 mg/dL — AB (ref 0.0–1.9)
Microalb Creat Ratio: 6.6 mg/g (ref 0.0–30.0)

## 2016-05-18 LAB — LDL CHOLESTEROL, DIRECT: LDL DIRECT: 103 mg/dL

## 2016-05-18 LAB — HEMOGLOBIN A1C: Hgb A1c MFr Bld: 6.6 % — ABNORMAL HIGH (ref 4.6–6.5)

## 2016-05-20 ENCOUNTER — Encounter: Payer: Self-pay | Admitting: Internal Medicine

## 2016-05-20 ENCOUNTER — Ambulatory Visit (INDEPENDENT_AMBULATORY_CARE_PROVIDER_SITE_OTHER): Payer: BC Managed Care – PPO | Admitting: Internal Medicine

## 2016-05-20 VITALS — BP 116/76 | HR 72 | Temp 98.0°F | Resp 16 | Ht 63.0 in | Wt 200.0 lb

## 2016-05-20 DIAGNOSIS — Z1239 Encounter for other screening for malignant neoplasm of breast: Secondary | ICD-10-CM

## 2016-05-20 DIAGNOSIS — E119 Type 2 diabetes mellitus without complications: Secondary | ICD-10-CM | POA: Diagnosis not present

## 2016-05-20 DIAGNOSIS — I10 Essential (primary) hypertension: Secondary | ICD-10-CM

## 2016-05-20 DIAGNOSIS — Z85828 Personal history of other malignant neoplasm of skin: Secondary | ICD-10-CM

## 2016-05-20 DIAGNOSIS — Z1231 Encounter for screening mammogram for malignant neoplasm of breast: Secondary | ICD-10-CM

## 2016-05-20 DIAGNOSIS — E669 Obesity, unspecified: Secondary | ICD-10-CM

## 2016-05-20 MED ORDER — LISINOPRIL-HYDROCHLOROTHIAZIDE 10-12.5 MG PO TABS: 1.0000 | ORAL_TABLET | Freq: Every day | ORAL | 3 refills | Status: DC

## 2016-05-20 NOTE — Progress Notes (Signed)
Pre-visit discussion using our clinic review tool. No additional management support is needed unless otherwise documented below in the visit note.  

## 2016-05-20 NOTE — Patient Instructions (Addendum)
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  Avoid pineapple, bananas and watermelon  Avoid " fat free" anything:   full of sugar!

## 2016-05-20 NOTE — Progress Notes (Signed)
Subjective:  Patient ID: Nichole Jensen, female    DOB: 03/31/1961  Age: 56 y.o. MRN: [id number]  CC: The primary encounter diagnosis was Breast cancer screening. Diagnoses of History of skin cancer, Type 2 diabetes mellitus without complication, without long-term current use of insulin ([place]), Essential hypertension, and Obesity (BMI 30-39.9) were also pertinent to this visit.  HPI Nichole Jensen presents for follow up on obesity with pre diabetes  She has lost 4 lbs, sleeping better, more energy  Exercising and following a  low gi diet. No complaints today.  Lab Results  Component Value Date   HGBA1C 6.6 (H) 05/18/2016     Outpatient Medications Prior to Visit  Medication Sig Dispense Refill  . aspirin 81 MG tablet Take 81 mg by mouth daily.      Marland Kitchen b complex vitamins tablet Take 1 tablet by mouth daily.    . Multiple Vitamin (MULTIVITAMIN) tablet Take 1 tablet by mouth daily.    . hydrochlorothiazide (MICROZIDE) 12.5 MG capsule TAKE ONE CAPSULE EVERY MORNING 90 capsule 3  . albuterol (PROVENTIL HFA;VENTOLIN HFA) 108 (90 BASE) MCG/ACT inhaler Inhale 2 puffs into the lungs every 6 (six) hours as needed for wheezing or shortness of breath. (Patient not taking: Reported on 05/20/2016) 1 Inhaler 0  . amoxicillin-clavulanate (AUGMENTIN) 875-125 MG tablet Take 1 tablet by mouth 2 (two) times daily. 14 tablet 0  . loratadine (CLARITIN) 10 MG tablet Take 10 mg by mouth daily.    . montelukast (SINGULAIR) 10 MG tablet Take 1 tablet (10 mg total) by mouth at bedtime. 90 tablet 3  . predniSONE (DELTASONE) 10 MG tablet 6 tablets on Day 1 , then reduce by 1 tablet daily until gone 21 tablet 0   No facility-administered medications prior to visit.     Review of Systems;  Patient denies headache, fevers, malaise, unintentional weight loss, skin rash, eye pain, sinus congestion and sinus pain, sore throat, dysphagia,  hemoptysis , cough, dyspnea, wheezing, chest pain, palpitations, orthopnea,  edema, abdominal pain, nausea, melena, diarrhea, constipation, flank pain, dysuria, hematuria, urinary  Frequency, nocturia, numbness, tingling, seizures,  Focal weakness, Loss of consciousness,  Tremor, insomnia, depression, anxiety, and suicidal ideation.      Objective:  BP 116/76   Pulse 72   Temp 98 F (36.7 C) (Oral)   Resp 16   Ht 5\' 3"  (1.6 m)   Wt 200 lb (90.7 kg)   SpO2 97%   BMI 35.43 kg/m   BP Readings from Last 3 Encounters:  05/20/16 116/76  02/12/16 136/90  11/20/15 126/88    Wt Readings from Last 3 Encounters:  05/20/16 200 lb (90.7 kg)  02/12/16 203 lb 12 oz (92.4 kg)  11/20/15 204 lb 9.6 oz (92.8 kg)    General appearance: alert, cooperative and appears stated age Ears: normal TM's and external ear canals both ears Throat: lips, mucosa, and tongue normal; teeth and gums normal Neck: no adenopathy, no carotid bruit, supple, symmetrical, trachea midline and thyroid not enlarged, symmetric, no tenderness/mass/nodules Back: symmetric, no curvature. ROM normal. No CVA tenderness. Lungs: clear to auscultation bilaterally Heart: regular rate and rhythm, S1, S2 normal, no murmur, click, rub or gallop Abdomen: soft, non-tender; bowel sounds normal; no masses,  no organomegaly Pulses: 2+ and symmetric Skin: Skin color, texture, turgor normal. No rashes or lesions Lymph nodes: Cervical, supraclavicular, and axillary nodes normal.  Lab Results  Component Value Date   HGBA1C 6.6 (H) 05/18/2016   HGBA1C 6.3 02/12/2016  HGBA1C 6.3 02/11/2015    Lab Results  Component Value Date   CREATININE 0.78 05/18/2016   CREATININE 0.84 02/12/2016   CREATININE 0.77 02/11/2015    Lab Results  Component Value Date   WBC 6.9 02/12/2016   HGB 13.6 02/12/2016   HCT 40.6 02/12/2016   PLT 224.0 02/12/2016   GLUCOSE 111 (H) 05/18/2016   CHOL 170 05/18/2016   TRIG 127.0 05/18/2016   HDL 38.40 (L) 05/18/2016   LDLDIRECT 103.0 05/18/2016   LDLCALC 106 (H) 05/18/2016    ALT 16 05/18/2016   AST 14 05/18/2016   NA 139 05/18/2016   K 3.6 05/18/2016   CL 106 05/18/2016   CREATININE 0.78 05/18/2016   BUN 17 05/18/2016   CO2 26 05/18/2016   TSH 2.02 02/12/2016   HGBA1C 6.6 (H) 05/18/2016   MICROALBUR 14.0 (H) 05/18/2016    Dg Chest 2 View  Result Date: 02/12/2016 CLINICAL DATA:  Persistent cough. EXAM: CHEST  2 VIEW COMPARISON:  Radiographs of February 11, 2015. FINDINGS: The heart size and mediastinal contours are within normal limits. Both lungs are clear. No pneumothorax or pleural effusion is noted. The visualized skeletal structures are unremarkable. IMPRESSION: No active cardiopulmonary disease. Electronically Signed   By: Marijo Conception, M.D.   On: 02/12/2016 12:15    Assessment & Plan:   Problem List Items Addressed This Visit    Hypertension    Well controlled on current regimen. Renal function stable,changing to lisinopril  Lab Results  Component Value Date   CREATININE 0.78 05/18/2016   Lab Results  Component Value Date   NA 139 05/18/2016   K 3.6 05/18/2016   CL 106 05/18/2016   CO2 26 05/18/2016         Relevant Medications   lisinopril-hydrochlorothiazide (PRINZIDE,ZESTORETIC) 10-12.5 MG tablet   Obesity (BMI 30-39.9)    I have congratulated her in making lifestyle changes and encouraged  Continued weight loss with goal of 10% of body weigh over the next 6 months using a low glycemic index diet and regular exercise a minimum of 5 days per week.        Type 2 diabetes mellitus (H[facility])    Diet controlled .  Discussed need for annual eye exams,  Follow up every 3 months and pneumovax vaccine. bp medication changed to ace inhibitor, given microalbuminuria today  Lab Results  Component Value Date   MICROALBUR 14.0 (H) 05/18/2016   Lab Results  Component Value Date   HGBA1C 6.6 (H) 05/18/2016         Relevant Medications   lisinopril-hydrochlorothiazide (PRINZIDE,ZESTORETIC) 10-12.5 MG tablet    Other Visit Diagnoses      Breast cancer screening    -  Primary   Relevant Orders   MM SCREENING BREAST TOMO BILATERAL   History of skin cancer       Relevant Orders   Ambulatory referral to Dermatology      I have discontinued Nichole Jensen's loratadine, hydrochlorothiazide, montelukast, amoxicillin-clavulanate, and predniSONE. I am also having her start on lisinopril-hydrochlorothiazide. Additionally, I am having her maintain her aspirin, multivitamin, albuterol, b complex vitamins, and Probiotic.  Meds ordered this encounter  Medications  . Probiotic CAPS    Sig: Take 1 capsule by mouth.  Marland Kitchen lisinopril-hydrochlorothiazide (PRINZIDE,ZESTORETIC) 10-12.5 MG tablet    Sig: Take 1 tablet by mouth daily.    Dispense:  90 tablet    Refill:  3    Medications Discontinued During This Encounter  Medication Reason  .  amoxicillin-clavulanate (AUGMENTIN) 875-125 MG tablet Completed Course  . predniSONE (DELTASONE) 10 MG tablet Completed Course  . loratadine (CLARITIN) 10 MG tablet Patient Discharge  . montelukast (SINGULAIR) 10 MG tablet Patient Discharge  . hydrochlorothiazide (MICROZIDE) 12.5 MG capsule     Follow-up: Return in about 3 months (around 08/18/2016) for 90+  days for labs, prior to [place] .   Crecencio Mc, MD

## 2016-05-22 DIAGNOSIS — E1121 Type 2 diabetes mellitus with diabetic nephropathy: Secondary | ICD-10-CM | POA: Insufficient documentation

## 2016-05-22 NOTE — Assessment & Plan Note (Signed)
Well controlled on current regimen. Renal function stable,changing to lisinopril  Lab Results  Component Value Date   CREATININE 0.78 05/18/2016   Lab Results  Component Value Date   NA 139 05/18/2016   K 3.6 05/18/2016   CL 106 05/18/2016   CO2 26 05/18/2016

## 2016-05-22 NOTE — Assessment & Plan Note (Signed)
Diet controlled .  Discussed need for annual eye exams,  Follow up every 3 months and pneumovax vaccine. bp medication changed to ace inhibitor, given microalbuminuria today  Lab Results  Component Value Date   MICROALBUR 14.0 (H) 05/18/2016   Lab Results  Component Value Date   HGBA1C 6.6 (H) 05/18/2016

## 2016-05-22 NOTE — Assessment & Plan Note (Signed)
I have congratulated her in making lifestyle changes and encouraged  Continued weight loss with goal of 10% of body weigh over the next 6 months using a low glycemic index diet and regular exercise a minimum of 5 days per week.

## 2016-07-28 ENCOUNTER — Ambulatory Visit: Payer: BC Managed Care – PPO

## 2016-08-12 ENCOUNTER — Ambulatory Visit: Payer: BC Managed Care – PPO | Admitting: Internal Medicine

## 2016-08-16 ENCOUNTER — Telehealth: Payer: Self-pay | Admitting: Radiology

## 2016-08-16 DIAGNOSIS — R5383 Other fatigue: Secondary | ICD-10-CM

## 2016-08-16 DIAGNOSIS — I10 Essential (primary) hypertension: Secondary | ICD-10-CM

## 2016-08-16 DIAGNOSIS — E119 Type 2 diabetes mellitus without complications: Secondary | ICD-10-CM

## 2016-08-16 NOTE — Telephone Encounter (Signed)
Pt coming in for labs tomorrow, please place orders. Thank you

## 2016-08-16 NOTE — Telephone Encounter (Signed)
done

## 2016-08-16 NOTE — Addendum Note (Signed)
Addended by: Crecencio Mc on: 08/16/2016 08:38 PM   Modules accepted: Orders

## 2016-08-17 ENCOUNTER — Encounter: Payer: Self-pay | Admitting: Internal Medicine

## 2016-08-17 ENCOUNTER — Other Ambulatory Visit (INDEPENDENT_AMBULATORY_CARE_PROVIDER_SITE_OTHER): Payer: BC Managed Care – PPO

## 2016-08-17 DIAGNOSIS — E119 Type 2 diabetes mellitus without complications: Secondary | ICD-10-CM

## 2016-08-17 DIAGNOSIS — R5383 Other fatigue: Secondary | ICD-10-CM

## 2016-08-17 DIAGNOSIS — I10 Essential (primary) hypertension: Secondary | ICD-10-CM

## 2016-08-17 LAB — COMPREHENSIVE METABOLIC PANEL
ALT: 17 U/L (ref 0–35)
AST: 15 U/L (ref 0–37)
Albumin: 4.4 g/dL (ref 3.5–5.2)
Alkaline Phosphatase: 78 U/L (ref 39–117)
BUN: 23 mg/dL (ref 6–23)
CALCIUM: 10 mg/dL (ref 8.4–10.5)
CHLORIDE: 107 meq/L (ref 96–112)
CO2: 22 meq/L (ref 19–32)
Creatinine, Ser: 0.83 mg/dL (ref 0.40–1.20)
GFR: 75.57 mL/min (ref >60.00)
GLUCOSE: 116 mg/dL — AB (ref 70–99)
POTASSIUM: 4.5 meq/L (ref 3.5–5.1)
Sodium: 139 mEq/L (ref 135–145)
Total Bilirubin: 0.4 mg/dL (ref 0.2–1.2)
Total Protein: 6.9 g/dL (ref 6.0–8.3)

## 2016-08-17 LAB — LIPID PANEL
CHOL/HDL RATIO: 4
Cholesterol: 188 mg/dL (ref 0–200)
HDL: 48.1 mg/dL (ref >39.00)
LDL Cholesterol: 104 mg/dL — ABNORMAL HIGH (ref 0–99)
NONHDL: 139.66
Triglycerides: 178 mg/dL — ABNORMAL HIGH (ref 0.0–149.0)
VLDL: 35.6 mg/dL (ref 0.0–40.0)

## 2016-08-17 LAB — CBC WITH DIFFERENTIAL/PLATELET
BASOS PCT: 0.6 % (ref 0.0–3.0)
Basophils Absolute: 0 10*3/uL (ref 0.0–0.1)
EOS PCT: 0.9 % (ref 0.0–5.0)
Eosinophils Absolute: 0.1 10*3/uL (ref 0.0–0.7)
HCT: 40.9 % (ref 36.0–46.0)
Hemoglobin: 13.7 g/dL (ref 12.0–15.0)
LYMPHS ABS: 2 10*3/uL (ref 0.7–4.0)
Lymphocytes Relative: 25.4 % (ref 12.0–46.0)
MCHC: 33.4 g/dL (ref 30.0–36.0)
MCV: 85.1 fl (ref 78.0–100.0)
MONO ABS: 0.5 10*3/uL (ref 0.1–1.0)
MONOS PCT: 6 % (ref 3.0–12.0)
NEUTROS ABS: 5.2 10*3/uL (ref 1.4–7.7)
NEUTROS PCT: 67.1 % (ref 43.0–77.0)
PLATELETS: 216 10*3/uL (ref 150.0–400.0)
RBC: 4.81 Mil/uL (ref 3.87–5.11)
RDW: 13.7 % (ref 11.5–15.5)
WBC: 7.7 10*3/uL (ref 4.0–10.5)

## 2016-08-17 LAB — LDL CHOLESTEROL, DIRECT: LDL DIRECT: 109 mg/dL

## 2016-08-17 LAB — MICROALBUMIN / CREATININE URINE RATIO
Creatinine,U: 91.4 mg/dL
MICROALB UR: 1.9 mg/dL (ref 0.0–1.9)
Microalb Creat Ratio: 2.1 mg/g (ref 0.0–30.0)

## 2016-08-17 LAB — HEMOGLOBIN A1C: Hgb A1c MFr Bld: 6.5 % (ref 4.6–6.5)

## 2016-08-17 LAB — VITAMIN B12: VITAMIN B 12: 649 pg/mL (ref 211–911)

## 2016-08-23 ENCOUNTER — Ambulatory Visit (INDEPENDENT_AMBULATORY_CARE_PROVIDER_SITE_OTHER): Payer: BC Managed Care – PPO | Admitting: Internal Medicine

## 2016-08-23 ENCOUNTER — Encounter: Payer: Self-pay | Admitting: Internal Medicine

## 2016-08-23 ENCOUNTER — Ambulatory Visit
Admission: RE | Admit: 2016-08-23 | Discharge: 2016-08-23 | Disposition: A | Discharge status: Home / Self Care | Payer: BC Managed Care – PPO | Source: Ambulatory Visit | Attending: Internal Medicine | Admitting: Internal Medicine

## 2016-08-23 DIAGNOSIS — Z1231 Encounter for screening mammogram for malignant neoplasm of breast: Secondary | ICD-10-CM | POA: Diagnosis not present

## 2016-08-23 DIAGNOSIS — E1121 Type 2 diabetes mellitus with diabetic nephropathy: Secondary | ICD-10-CM

## 2016-08-23 DIAGNOSIS — J301 Allergic rhinitis due to pollen: Secondary | ICD-10-CM

## 2016-08-23 DIAGNOSIS — J209 Acute bronchitis, unspecified: Secondary | ICD-10-CM | POA: Diagnosis not present

## 2016-08-23 DIAGNOSIS — E669 Obesity, unspecified: Secondary | ICD-10-CM

## 2016-08-23 DIAGNOSIS — I10 Essential (primary) hypertension: Secondary | ICD-10-CM | POA: Diagnosis not present

## 2016-08-23 DIAGNOSIS — Z1239 Encounter for other screening for malignant neoplasm of breast: Secondary | ICD-10-CM

## 2016-08-23 MED ORDER — ALBUTEROL SULFATE HFA 108 (90 BASE) MCG/ACT IN AERS: 2.0000 | INHALATION_SPRAY | Freq: Four times a day (QID) | RESPIRATORY_TRACT | 3 refills | Status: DC | PRN

## 2016-08-23 NOTE — Patient Instructions (Addendum)
For your allergies ,  You can use Benadryl but you should also consider adding one of these newer second generation antihistamines that are longer acting, non sedating and  available OTC:  Generic  Zyrtec, which is cetirizine.    generic Allegra , available generically as fexofenadine ; comes in 60 mg and 180 mg once daily strengths.    Either can be combined with singulair and taken at the same time    Your diabetes and hypertension are under excellent control  And your proteinuria has resolved.  Please continue your current medications. return in 3 months for follow up on diabetes and ask Dr Ellin Mayhew to send [place] documentation of your last eye exam

## 2016-08-23 NOTE — Progress Notes (Signed)
Pre visit review using our clinic review tool, if applicable. No additional management support is needed unless otherwise documented below in the visit note. 

## 2016-08-23 NOTE — Progress Notes (Signed)
Subjective:  Patient ID: Nichole Jensen, female    DOB: May 23, 1960  Age: 56 y.o. MRN: [id number]  CC: Diagnoses of Acute bronchitis, unspecified organism, Essential hypertension, Seasonal allergic rhinitis due to pollen, Type 2 diabetes mellitus with nephropathy ([place]), and Obesity (BMI 30.0-34.9) were pertinent to this visit.  HPI CAMELLA SEIM presents for 3 month follow up on diabetes diagnosed with a1c of 6.6 in January no fasting glucose over 116 .  Patient has no complaints today.  Patient is following a low glycemic index diet.  Eating more dairy which causes fluid retention.  Walking regularly  Craves fruit as her sweet tooth  And pasta.   Fasting sugars have been under less than 140 most of the time and post prandials have been under 160 except on rare occasions. Patient is exercising about 3 times per week and intentionally trying to lose weight .  Patient has had an eye exam in the last 12 months and checks feet regularly for signs of infection.  Patient does not walk barefoot outside,  And denies an numbness tingling or burning in feet. Patient is up to date on all recommended vaccinations  Seeing [facility] annually for eye exam .last visit march 2017 proteinuria resolved with lisinopril/hct    Allergies with cough and wheezing .  Started after an incident where she accidentally inhaled diesel at age 48.  Diagnosed with RAD and env allergies..  Using singulair and claritin   Lab Results  Component Value Date   HGBA1C 6.5 08/17/2016   Lab Results  Component Value Date   CHOL 188 08/17/2016   HDL 48.10 08/17/2016   LDLCALC 104 (H) 08/17/2016   LDLDIRECT 109.0 08/17/2016   TRIG 178.0 (H) 08/17/2016   CHOLHDL 4 08/17/2016     Outpatient Medications Prior to Visit  Medication Sig Dispense Refill  . aspirin 81 MG tablet Take 81 mg by mouth daily.      Marland Kitchen b complex vitamins tablet Take 1 tablet by mouth daily.    Marland Kitchen lisinopril-hydrochlorothiazide (PRINZIDE,ZESTORETIC)  10-12.5 MG tablet Take 1 tablet by mouth daily. 90 tablet 3  . Multiple Vitamin (MULTIVITAMIN) tablet Take 1 tablet by mouth daily.    . Probiotic CAPS Take 1 capsule by mouth.    Marland Kitchen albuterol (PROVENTIL HFA;VENTOLIN HFA) 108 (90 BASE) MCG/ACT inhaler Inhale 2 puffs into the lungs every 6 (six) hours as needed for wheezing or shortness of breath. 1 Inhaler 0   No facility-administered medications prior to visit.     Review of Systems;  Patient denies headache, fevers, malaise, unintentional weight loss, skin rash, eye pain, sinus congestion and sinus pain, sore throat, dysphagia,  hemoptysis ,dyspnea, wheezing, chest pain, palpitations, orthopnea, edema, abdominal pain, nausea, melena, diarrhea, constipation, flank pain, dysuria, hematuria, urinary  Frequency, nocturia, numbness, tingling, seizures,  Focal weakness, Loss of consciousness,  Tremor, insomnia, depression, anxiety, and suicidal ideation.      Objective:  BP 110/76 (BP Location: Left Arm, Patient Position: Sitting, Cuff Size: Large)   Pulse 81   Temp 98.2 F (36.8 C) (Oral)   Resp 16   Ht 5\' 3"  (1.6 m)   Wt 199 lb 3.2 oz (90.4 kg)   SpO2 96%   BMI 35.29 kg/m   BP Readings from Last 3 Encounters:  08/23/16 110/76  05/20/16 116/76  02/12/16 136/90    Wt Readings from Last 3 Encounters:  08/23/16 199 lb 3.2 oz (90.4 kg)  05/20/16 200 lb (90.7 kg)  02/12/16 203 lb  12 oz (92.4 kg)    General appearance: alert, cooperative and appears stated age Ears: normal TM's and external ear canals both ears Throat: lips, mucosa, and tongue normal; teeth and gums normal Neck: no adenopathy, no carotid bruit, supple, symmetrical, trachea midline and thyroid not enlarged, symmetric, no tenderness/mass/nodules Back: symmetric, no curvature. ROM normal. No CVA tenderness. Lungs: clear to auscultation bilaterally Heart: regular rate and rhythm, S1, S2 normal, no murmur, click, rub or gallop Abdomen: soft, non-tender; bowel sounds  normal; no masses,  no organomegaly Pulses: 2+ and symmetric Skin: Skin color, texture, turgor normal. No rashes or lesions Lymph nodes: Cervical, supraclavicular, and axillary nodes normal.  Lab Results  Component Value Date   HGBA1C 6.5 08/17/2016   HGBA1C 6.6 (H) 05/18/2016   HGBA1C 6.3 02/12/2016    Lab Results  Component Value Date   CREATININE 0.83 08/17/2016   CREATININE 0.78 05/18/2016   CREATININE 0.84 02/12/2016    Lab Results  Component Value Date   WBC 7.7 08/17/2016   HGB 13.7 08/17/2016   HCT 40.9 08/17/2016   PLT 216.0 08/17/2016   GLUCOSE 116 (H) 08/17/2016   CHOL 188 08/17/2016   TRIG 178.0 (H) 08/17/2016   HDL 48.10 08/17/2016   LDLDIRECT 109.0 08/17/2016   LDLCALC 104 (H) 08/17/2016   ALT 17 08/17/2016   AST 15 08/17/2016   NA 139 08/17/2016   K 4.5 08/17/2016   CL 107 08/17/2016   CREATININE 0.83 08/17/2016   BUN 23 08/17/2016   CO2 22 08/17/2016   TSH 2.02 02/12/2016   HGBA1C 6.5 08/17/2016   MICROALBUR 1.9 08/17/2016    Dg Chest 2 View  Result Date: 02/12/2016 CLINICAL DATA:  Persistent cough. EXAM: CHEST  2 VIEW COMPARISON:  Radiographs of February 11, 2015. FINDINGS: The heart size and mediastinal contours are within normal limits. Both lungs are clear. No pneumothorax or pleural effusion is noted. The visualized skeletal structures are unremarkable. IMPRESSION: No active cardiopulmonary disease. Electronically Signed   By: Marijo Conception, M.D.   On: 02/12/2016 12:15    Assessment & Plan:   Problem List Items Addressed This Visit    Essential hypertension    Well controlled on current regimen. Renal f  Lab Results  Component Value Date   CREATININE 0.83 08/17/2016   Lab Results  Component Value Date   NA 139 08/17/2016   K 4.5 08/17/2016   CL 107 08/17/2016   CO2 22 08/17/2016   stable, no changes today.      Obesity (BMI 30.0-34.9)    I have addressed  BMI and recommended wt loss of 10% of body weight over the next 6  months using a low fat, low starch, high protein  fruit/vegetable based Mediterranean diet and 30 minutes of aerobic exercise a minimum of 5 days per week.        Seasonal allergies    daily use of sinus rinses during pollen season and use of 2nd generation antihistamine advised.         Type 2 diabetes mellitus with nephropathy ([place])    Diet controlled  Reminded to have her annual eye exams,  Follow up every 3 months and pneumovax vaccine given   Her proteinuria has resolved with addition of ace inhibitor,  Lab Results  Component Value Date   MICROALBUR 1.9 08/17/2016   Lab Results  Component Value Date   HGBA1C 6.5 08/17/2016          Other Visit Diagnoses  Acute bronchitis, unspecified organism       Relevant Medications   albuterol (PROVENTIL HFA;VENTOLIN HFA) 108 (90 Base) MCG/ACT inhaler      I am having Ms. Avina maintain her aspirin, multivitamin, b complex vitamins, Probiotic, lisinopril-hydrochlorothiazide, montelukast, loratadine, and albuterol.  Meds ordered this encounter  Medications  . montelukast (SINGULAIR) 10 MG tablet    Sig: Take 10 mg by mouth at bedtime.  Marland Kitchen loratadine (CLARITIN) 10 MG tablet    Sig: Take 10 mg by mouth daily.  Marland Kitchen albuterol (PROVENTIL HFA;VENTOLIN HFA) 108 (90 Base) MCG/ACT inhaler    Sig: Inhale 2 puffs into the lungs every 6 (six) hours as needed for wheezing or shortness of breath.    Dispense:  1 Inhaler    Refill:  3    Medications Discontinued During This Encounter  Medication Reason  . albuterol (PROVENTIL HFA;VENTOLIN HFA) 108 (90 BASE) MCG/ACT inhaler Reorder    Follow-up: Return in about 3 months (around 11/22/2016) for follow up diabetes, no labs required.   Crecencio Mc, MD

## 2016-08-24 DIAGNOSIS — E669 Obesity, unspecified: Secondary | ICD-10-CM | POA: Insufficient documentation

## 2016-08-24 NOTE — Assessment & Plan Note (Signed)
Diet controlled  Reminded to have her annual eye exams,  Follow up every 3 months and pneumovax vaccine given   Her proteinuria has resolved with addition of ace inhibitor,  Lab Results  Component Value Date   MICROALBUR 1.9 08/17/2016   Lab Results  Component Value Date   HGBA1C 6.5 08/17/2016

## 2016-08-24 NOTE — Assessment & Plan Note (Signed)
Well controlled on current regimen. Renal f  Lab Results  Component Value Date   CREATININE 0.83 08/17/2016   Lab Results  Component Value Date   NA 139 08/17/2016   K 4.5 08/17/2016   CL 107 08/17/2016   CO2 22 08/17/2016   stable, no changes today.

## 2016-08-24 NOTE — Assessment & Plan Note (Signed)
daily use of sinus rinses during pollen season and use of 2nd generation antihistamine advised.

## 2016-08-24 NOTE — Assessment & Plan Note (Signed)
I have addressed  BMI and recommended wt loss of 10% of body weight over the next 6 months using a low fat, low starch, high protein  fruit/vegetable based Mediterranean diet and 30 minutes of aerobic exercise a minimum of 5 days per week.   

## 2016-09-13 ENCOUNTER — Telehealth: Payer: Self-pay | Admitting: Internal Medicine

## 2016-09-13 NOTE — Telephone Encounter (Signed)
Pt came in and drop off a copy of her eye exam. It is in Dr Derrel Nip folder up front. Thank you!

## 2016-09-13 NOTE — Telephone Encounter (Signed)
Received and placed in Dr. Lupita Dawn result folder.

## 2016-09-16 ENCOUNTER — Encounter: Payer: Self-pay | Admitting: Internal Medicine

## 2016-10-19 ENCOUNTER — Ambulatory Visit (INDEPENDENT_AMBULATORY_CARE_PROVIDER_SITE_OTHER): Payer: BC Managed Care – PPO | Admitting: Internal Medicine

## 2016-10-19 ENCOUNTER — Encounter: Payer: Self-pay | Admitting: Internal Medicine

## 2016-10-19 VITALS — BP 118/78 | HR 98 | Temp 98.8°F | Wt 197.0 lb

## 2016-10-19 DIAGNOSIS — J0100 Acute maxillary sinusitis, unspecified: Secondary | ICD-10-CM | POA: Diagnosis not present

## 2016-10-19 MED ORDER — AMOXICILLIN-POT CLAVULANATE 875-125 MG PO TABS: 1.0000 | ORAL_TABLET | Freq: Two times a day (BID) | ORAL | 0 refills | Status: DC

## 2016-10-19 NOTE — Progress Notes (Signed)
HPI  Pt presents to the clinic today with c/o headache, facial pain and pressure, nasal congestion, sore throat and cough. She reports this started 5 days ago. She is blowing green mucous out of her nose. She denies difficulty swallowing. The cough is productive of green mucous. She denies fever but has had chills and body aches. She has tried Singulair, Claritin and salt water gargles with minimal relief. She has a history of allergies but reports this is much worse than her typical allergies. She has not had sick contacts that she is aware of.  Review of Systems     Past Medical History:  Diagnosis Date  . Allergy   . Hyperlipidemia   . Hypertension     Family History  Problem Relation Age of Onset  . Heart disease Father   . Heart disease Paternal Uncle   . Heart disease Paternal Grandmother   . Heart disease Paternal Grandfather   . Diabetes Sister   . Hypertension Sister   . Diabetes Brother   . Hypertension Brother   . Cancer Brother 89       lung, non-smoker  . Colon cancer Neg Hx   . Stomach cancer Neg Hx   . Breast cancer Neg Hx     Social History   Social History  . Marital status: Divorced    Spouse name: N/A  . Number of children: N/A  . Years of education: N/A   Occupational History  . Not on file.   Social History Main Topics  . Smoking status: Never Smoker  . Smokeless tobacco: Never Used  . Alcohol use Yes     Comment: rare  . Drug use: No  . Sexual activity: Not on file   Other Topics Concern  . Not on file   Social History Narrative  . No narrative on file    Allergies  Allergen Reactions  . Macrodantin Hives     Constitutional: Positive headache. Denies fatigue, fever or abrupt weight changes.  HEENT:  Positive facial pain, nasal congestion and sore throat. Denies eye redness, ear pain, ringing in the ears, wax buildup, runny nose or bloody nose. Respiratory: Positive cough. Denies difficulty breathing or shortness of breath.   Cardiovascular: Denies chest pain, chest tightness, palpitations or swelling in the hands or feet.   No other specific complaints in a complete review of systems (except as listed in HPI above).  Objective:   BP 118/78   Pulse 98   Temp 98.8 F (37.1 C) (Oral)   Wt 197 lb (89.4 kg)   SpO2 97%   BMI 34.90 kg/m   General: Appears her stated age, ill appearing in NAD. HEENT: Head: normal shape and size, maxillary sinus tenderness noted; Ears: Tm's gray and intact, normal light reflex, + serous effusion on the left; Nose: mucosa boggy and moist, turbinates swollen; Throat/Mouth: + PND. Teeth present, mucosa erythematous and moist, no exudate noted, no lesions or ulcerations noted.  Neck:  No adenopathy noted but neck tender to touch.  Pulmonary/Chest: Normal effort and positive vesicular breath sounds. No respiratory distress. No wheezes, rales or ronchi noted.       Assessment & Plan:   Acute bacterial sinusitis  Can use a Neti Pot which can be purchased from your local drug store. Flonase 2 sprays each nostril for 3 days and then as needed. eRx for Augmentin BID for 10 days  RTC as needed or if symptoms persist. Webb Silversmith, NP

## 2016-10-19 NOTE — Patient Instructions (Signed)

## 2016-11-03 ENCOUNTER — Ambulatory Visit (INDEPENDENT_AMBULATORY_CARE_PROVIDER_SITE_OTHER): Payer: BC Managed Care – PPO | Admitting: Family Medicine

## 2016-11-03 ENCOUNTER — Encounter: Payer: Self-pay | Admitting: Family Medicine

## 2016-11-03 DIAGNOSIS — J0191 Acute recurrent sinusitis, unspecified: Secondary | ICD-10-CM | POA: Diagnosis not present

## 2016-11-03 LAB — POCT RAPID STREP A (OFFICE): RAPID STREP A SCREEN: NEGATIVE

## 2016-11-03 MED ORDER — LEVOFLOXACIN 500 MG PO TABS: 500.0000 mg | ORAL_TABLET | Freq: Every day | ORAL | 0 refills | Status: DC

## 2016-11-03 NOTE — Patient Instructions (Signed)
Antibiotic as prescribed.  Please follow up with your PCP.  Take care  Dr. Lacinda Axon    Sinusitis, Adult Sinusitis is soreness and inflammation of your sinuses. Sinuses are hollow spaces in the bones around your face. Your sinuses are located:  Around your eyes.  In the middle of your forehead.  Behind your nose.  In your cheekbones.  Your sinuses and nasal passages are lined with a stringy fluid (mucus). Mucus normally drains out of your sinuses. When your nasal tissues become inflamed or swollen, the mucus can become trapped or blocked so air cannot flow through your sinuses. This allows bacteria, viruses, and funguses to grow, which leads to infection. Sinusitis can develop quickly and last for 7?10 days (acute) or for more than 12 weeks (chronic). Sinusitis often develops after a cold. What are the causes? This condition is caused by anything that creates swelling in the sinuses or stops mucus from draining, including:  Allergies.  Asthma.  Bacterial or viral infection.  Abnormally shaped bones between the nasal passages.  Nasal growths that contain mucus (nasal polyps).  Narrow sinus openings.  Pollutants, such as chemicals or irritants in the air.  A foreign object stuck in the nose.  A fungal infection. This is rare.  What increases the risk? The following factors may make you more likely to develop this condition:  Having allergies or asthma.  Having had a recent cold or respiratory tract infection.  Having structural deformities or blockages in your nose or sinuses.  Having a weak immune system.  Doing a lot of swimming or diving.  Overusing nasal sprays.  Smoking.  What are the signs or symptoms? The main symptoms of this condition are pain and a feeling of pressure around the affected sinuses. Other symptoms include:  Upper toothache.  Earache.  Headache.  Bad breath.  Decreased sense of smell and taste.  A cough that may get worse at  night.  Fatigue.  Fever.  Thick drainage from your nose. The drainage is often green and it may contain pus (purulent).  Stuffy nose or congestion.  Postnasal drip. This is when extra mucus collects in the throat or back of the nose.  Swelling and warmth over the affected sinuses.  Sore throat.  Sensitivity to light.  How is this diagnosed? This condition is diagnosed based on symptoms, a medical history, and a physical exam. To find out if your condition is acute or chronic, your health care provider may:  Look in your nose for signs of nasal polyps.  Tap over the affected sinus to check for signs of infection.  View the inside of your sinuses using an imaging device that has a light attached (endoscope).  If your health care provider suspects that you have chronic sinusitis, you may also:  Be tested for allergies.  Have a sample of mucus taken from your nose (nasal culture) and checked for bacteria.  Have a mucus sample examined to see if your sinusitis is related to an allergy.  If your sinusitis does not respond to treatment and it lasts longer than 8 weeks, you may have an MRI or CT scan to check your sinuses. These scans also help to determine how severe your infection is. In rare cases, a bone biopsy may be done to rule out more serious types of fungal sinus disease. How is this treated? Treatment for sinusitis depends on the cause and whether your condition is chronic or acute. If a virus is causing your sinusitis, your  symptoms will go away on their own within 10 days. You may be given medicines to relieve your symptoms, including:  Topical nasal decongestants. They shrink swollen nasal passages and let mucus drain from your sinuses.  Antihistamines. These drugs block inflammation that is triggered by allergies. This can help to ease swelling in your nose and sinuses.  Topical nasal corticosteroids. These are nasal sprays that ease inflammation and swelling in  your nose and sinuses.  Nasal saline washes. These rinses can help to get rid of thick mucus in your nose.  If your condition is caused by bacteria, you will be given an antibiotic medicine. If your condition is caused by a fungus, you will be given an antifungal medicine. Surgery may be needed to correct underlying conditions, such as narrow nasal passages. Surgery may also be needed to remove polyps. Follow these instructions at home: Medicines  Take, use, or apply over-the-counter and prescription medicines only as told by your health care provider. These may include nasal sprays.  If you were prescribed an antibiotic medicine, take it as told by your health care provider. Do not stop taking the antibiotic even if you start to feel better. Hydrate and Humidify  Drink enough water to keep your urine clear or pale yellow. Staying hydrated will help to thin your mucus.  Use a cool mist humidifier to keep the humidity level in your home above 50%.  Inhale steam for 10-15 minutes, 3-4 times a day or as told by your health care provider. You can do this in the bathroom while a hot shower is running.  Limit your exposure to cool or dry air. Rest  Rest as much as possible.  Sleep with your head raised (elevated).  Make sure to get enough sleep each night. General instructions  Apply a warm, moist washcloth to your face 3-4 times a day or as told by your health care provider. This will help with discomfort.  Wash your hands often with soap and water to reduce your exposure to viruses and other germs. If soap and water are not available, use hand sanitizer.  Do not smoke. Avoid being around people who are smoking (secondhand smoke).  Keep all follow-up visits as told by your health care provider. This is important. Contact a health care provider if:  You have a fever.  Your symptoms get worse.  Your symptoms do not improve within 10 days. Get help right away if:  You have a  severe headache.  You have persistent vomiting.  You have pain or swelling around your face or eyes.  You have vision problems.  You develop confusion.  Your neck is stiff.  You have trouble breathing. This information is not intended to replace advice given to you by your health care provider. Make sure you discuss any questions you have with your health care provider. Document Released: 04/12/2005 Document Revised: 12/07/2015 Document Reviewed: 02/05/2015 Elsevier Interactive Patient Education  2017 [employer].

## 2016-11-03 NOTE — Progress Notes (Signed)
Subjective:  Patient ID: Nichole Jensen, female    DOB: Apr 17, 1961  Age: 56 y.o. MRN: [id number]  CC: Sinusitis  HPI:  56 year old female presents with the above complaint.  Patient states that she's had ongoing issues with sinusitis since the end of June. She was seen and treated on 6/26. She was treated with Augmentin. She had improvement but then worsened again yesterday. She's had productive cough, sore throat, congestion. No fever. She reports associated fatigue and malaise. No known exacerbating or relieving factors. No other associated symptoms. No other complaints this time.  Social Hx   Social History   Social History  . Marital status: Divorced    Spouse name: N/A  . Number of children: N/A  . Years of education: N/A   Social History Main Topics  . Smoking status: Never Smoker  . Smokeless tobacco: Never Used  . Alcohol use Yes     Comment: rare  . Drug use: No  . Sexual activity: Not Asked   Other Topics Concern  . None   Social History Narrative  . None    Review of Systems  Constitutional: Positive for fatigue. Negative for fever.  HENT: Positive for congestion, postnasal drip, sinus pressure and sore throat.    Objective:  BP 118/80 (BP Location: Left Arm, Patient Position: Sitting, Cuff Size: Normal)   Pulse 89   Temp 98.4 F (36.9 C) (Oral)   Wt 198 lb 6 oz (90 kg)   SpO2 (!) 87%   BMI 35.14 kg/m   BP/Weight 11/03/2016 10/19/2016 9/83/3825  Systolic BP 053 976 734  Diastolic BP 80 78 76  Wt. (Lbs) 198.38 197 199.2  BMI 35.14 34.9 35.29   Physical Exam  Constitutional: She is oriented to person, place, and time. She appears well-developed. No distress.  HENT:  Head: Normocephalic and atraumatic.  Mouth/Throat: Oropharynx is clear and moist.  Normal TM's.  Cardiovascular: Normal rate and regular rhythm.   Pulmonary/Chest: Effort normal. She has no wheezes. She has no rales.  Lymphadenopathy:    She has no cervical adenopathy.    Neurological: She is alert and oriented to person, place, and time.  Psychiatric: She has a normal mood and affect.  Vitals reviewed.  Lab Results  Component Value Date   WBC 7.7 08/17/2016   HGB 13.7 08/17/2016   HCT 40.9 08/17/2016   PLT 216.0 08/17/2016   GLUCOSE 116 (H) 08/17/2016   CHOL 188 08/17/2016   TRIG 178.0 (H) 08/17/2016   HDL 48.10 08/17/2016   LDLDIRECT 109.0 08/17/2016   LDLCALC 104 (H) 08/17/2016   ALT 17 08/17/2016   AST 15 08/17/2016   NA 139 08/17/2016   K 4.5 08/17/2016   CL 107 08/17/2016   CREATININE 0.83 08/17/2016   BUN 23 08/17/2016   CO2 22 08/17/2016   TSH 2.02 02/12/2016   HGBA1C 6.5 08/17/2016   MICROALBUR 1.9 08/17/2016    Assessment & Plan:   Problem List Items Addressed This Visit      Respiratory   Acute recurrent sinusitis    Recurrent. Treating with Levaquin.      Relevant Medications   levofloxacin (LEVAQUIN) 500 MG tablet   Other Relevant Orders   POCT rapid strep A (Completed)     Meds ordered this encounter  Medications  . levofloxacin (LEVAQUIN) 500 MG tablet    Sig: Take 1 tablet (500 mg total) by mouth daily.    Dispense:  7 tablet    Refill:  0  Follow-up: PRN  [place]

## 2016-11-03 NOTE — Assessment & Plan Note (Signed)
Recurrent. Treating with Levaquin.

## 2016-11-26 ENCOUNTER — Encounter: Payer: Self-pay | Admitting: Internal Medicine

## 2016-11-26 ENCOUNTER — Ambulatory Visit (INDEPENDENT_AMBULATORY_CARE_PROVIDER_SITE_OTHER): Payer: BC Managed Care – PPO | Admitting: Internal Medicine

## 2016-11-26 VITALS — BP 102/76 | HR 71 | Temp 98.1°F | Resp 15 | Ht 63.0 in | Wt 198.6 lb

## 2016-11-26 DIAGNOSIS — E669 Obesity, unspecified: Secondary | ICD-10-CM

## 2016-11-26 DIAGNOSIS — I10 Essential (primary) hypertension: Secondary | ICD-10-CM | POA: Diagnosis not present

## 2016-11-26 DIAGNOSIS — E1121 Type 2 diabetes mellitus with diabetic nephropathy: Secondary | ICD-10-CM | POA: Diagnosis not present

## 2016-11-26 DIAGNOSIS — J0191 Acute recurrent sinusitis, unspecified: Secondary | ICD-10-CM | POA: Diagnosis not present

## 2016-11-26 MED ORDER — FLUTICASONE PROPIONATE 50 MCG/ACT NA SUSP: 2.0000 | Freq: Every day | NASAL | 2 refills | Status: DC

## 2016-11-26 NOTE — Patient Instructions (Addendum)
Your recurrent sinus infections are probablay triggered by an allergic reaction to somethin in your work environment, as you suspected.    I recommend daily use of a sinus flush called [employer] Sinus rinse when you come home from work ;  It is a stong sinus "flush" using water and medicated salts.  Do it over the sink because it can be a bit messy  I also recommend using Flonase  (steroid nasal spray) instead of claritin daily during work days

## 2016-11-26 NOTE — Progress Notes (Signed)
Subjective:  Patient ID: Nichole Jensen, female    DOB: 07/05/1960  Age: 57 y.o. MRN: [id number]  CC: The primary encounter diagnosis was Essential hypertension. Diagnoses of Type 2 diabetes mellitus with nephropathy ([place]), Acute recurrent sinusitis, unspecified location, and Obesity (BMI 30-39.9) were also pertinent to this visit.  HPI Nichole Jensen presents for follow up on type 2 DM and other issues  3 month follow up on diabetes.   Patient is following a low glycemic index diet and taking all prescribed medications regularly without side effects.  Fasting sugars have been under less than 140 most of the time and post prandials have been under 160 except on rare occasions. Patient is exercising about 3 times per week and intentionally trying to lose weight .  Patient has had an eye exam in the last 12 months and checks feet regularly for signs of infection.  Patient does not walk barefoot outside,  And denies an numbness tingling or burning in feet. Patient is up to date on all recommended vaccinations.    She has had 2 episodes of sinusitis in less than a month.  The first episode was  treated with augmentin , then 2 weeks later symptoms recurred and she received levaquin for same symptoms which included facial pain and ear pain .  Decided to stop the claritin since then because it was drying her out too much.   No longer Using Neti pot  Regularly   Symptoms start every Monday when she returns to work, but particularly when she has to work in  two particular conference rooms.  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  Cough and congestion start  when she enters the building   Hasn't been able to exercise due to recurrent sinusitis.     Lab Results  Component Value Date   HGBA1C 6.5 08/17/2016     Outpatient Medications Prior to Visit  Medication Sig Dispense Refill  . aspirin 81 MG tablet Take 81 mg by mouth daily.      Marland Kitchen lisinopril-hydrochlorothiazide (PRINZIDE,ZESTORETIC)  10-12.5 MG tablet Take 1 tablet by mouth daily. 90 tablet 3  . Probiotic CAPS Take 1 capsule by mouth.    Marland Kitchen albuterol (PROVENTIL HFA;VENTOLIN HFA) 108 (90 Base) MCG/ACT inhaler Inhale 2 puffs into the lungs every 6 (six) hours as needed for wheezing or shortness of breath. (Patient not taking: Reported on 11/26/2016) 1 Inhaler 3  . levofloxacin (LEVAQUIN) 500 MG tablet Take 1 tablet (500 mg total) by mouth daily. (Patient not taking: Reported on 11/26/2016) 7 tablet 0  . loratadine (CLARITIN) 10 MG tablet Take 10 mg by mouth daily.    . montelukast (SINGULAIR) 10 MG tablet Take 10 mg by mouth at bedtime.    . Multiple Vitamin (MULTIVITAMIN) tablet Take 1 tablet by mouth daily.     No facility-administered medications prior to visit.     Review of Systems;  Patient denies headache, fevers, malaise, unintentional weight loss, skin rash, eye pain, sinus congestion and sinus pain, sore throat, dysphagia,  hemoptysis , cough, dyspnea, wheezing, chest pain, palpitations, orthopnea, edema, abdominal pain, nausea, melena, diarrhea, constipation, flank pain, dysuria, hematuria, urinary  Frequency, nocturia, numbness, tingling, seizures,  Focal weakness, Loss of consciousness,  Tremor, insomnia, depression, anxiety, and suicidal ideation.      Objective:  BP 102/76 (BP Location: Left Arm, Patient Position: Sitting, Cuff Size: Normal)   Pulse 71   Temp 98.1 F (36.7 C) (Oral)   Resp 15  Ht 5\' 3"  (1.6 m)   Wt 198 lb 9.6 oz (90.1 kg)   SpO2 97%   BMI 35.18 kg/m   BP Readings from Last 3 Encounters:  11/26/16 102/76  11/03/16 118/80  10/19/16 118/78    Wt Readings from Last 3 Encounters:  11/26/16 198 lb 9.6 oz (90.1 kg)  11/03/16 198 lb 6 oz (90 kg)  10/19/16 197 lb (89.4 kg)    General appearance: alert, cooperative and appears stated age Ears: normal TM's and external ear canals both ears Throat: lips, mucosa, and tongue normal; teeth and gums normal Neck: no adenopathy, no carotid  bruit, supple, symmetrical, trachea midline and thyroid not enlarged, symmetric, no tenderness/mass/nodules Back: symmetric, no curvature. ROM normal. No CVA tenderness. Lungs: clear to auscultation bilaterally Heart: regular rate and rhythm, S1, S2 normal, no murmur, click, rub or gallop Abdomen: soft, non-tender; bowel sounds normal; no masses,  no organomegaly Pulses: 2+ and symmetric Skin: Skin color, texture, turgor normal. No rashes or lesions Lymph nodes: Cervical, supraclavicular, and axillary nodes normal.  Lab Results  Component Value Date   HGBA1C 6.5 08/17/2016   HGBA1C 6.6 (H) 05/18/2016   HGBA1C 6.3 02/12/2016    Lab Results  Component Value Date   CREATININE 0.83 08/17/2016   CREATININE 0.78 05/18/2016   CREATININE 0.84 02/12/2016    Lab Results  Component Value Date   WBC 7.7 08/17/2016   HGB 13.7 08/17/2016   HCT 40.9 08/17/2016   PLT 216.0 08/17/2016   GLUCOSE 116 (H) 08/17/2016   CHOL 188 08/17/2016   TRIG 178.0 (H) 08/17/2016   HDL 48.10 08/17/2016   LDLDIRECT 109.0 08/17/2016   LDLCALC 104 (H) 08/17/2016   ALT 17 08/17/2016   AST 15 08/17/2016   NA 139 08/17/2016   K 4.5 08/17/2016   CL 107 08/17/2016   CREATININE 0.83 08/17/2016   BUN 23 08/17/2016   CO2 22 08/17/2016   TSH 2.02 02/12/2016   HGBA1C 6.5 08/17/2016   MICROALBUR 1.9 08/17/2016    Mm Screening Breast Tomo Bilateral  Result Date: 08/24/2016 CLINICAL DATA:  Screening. EXAM: 2D DIGITAL SCREENING BILATERAL MAMMOGRAM WITH CAD AND ADJUNCT TOMO COMPARISON:  Previous exam(s). ACR Breast Density Category c: The breast tissue is heterogeneously dense, which may obscure small masses. FINDINGS: There are no findings suspicious for malignancy. Images were processed with CAD. IMPRESSION: No mammographic evidence of malignancy. A result letter of this screening mammogram will be mailed directly to the patient. RECOMMENDATION: Screening mammogram in one year. (Code:SM-B-01Y) BI-RADS CATEGORY  1:  Negative. Electronically Signed   By: Ammie Ferrier M.D.   On: 08/24/2016 09:09    Assessment & Plan:   Problem List Items Addressed This Visit    Type II diabetes mellitus with nephropathy ([place])    Diet controlled  Reminded to have her annual eye exams,  Follow up every 3 months and pneumovax vaccine given   Her proteinuria has resolved with addition of ace inhibitor,  Lab Results  Component Value Date   MICROALBUR 1.9 08/17/2016   Lab Results  Component Value Date   HGBA1C 6.5 08/17/2016         Obesity (BMI 30-39.9)    I have addressed  BMI and recommended wt loss of 10% of body weigh over the next 6 months using a low glycemic index diet and regular exercise a minimum of 5 days per week.        Essential hypertension - Primary    Well controlled on  current regimen. Renal function stable, no changes today.      Relevant Orders   Comprehensive metabolic panel   Acute recurrent sinusitis    Triggered by allergic reaction to environmental allergies experienced at work.  topical steroid recommended , and daily use of saline lavage.        Relevant Medications   fluticasone (FLONASE ALLERGY RELIEF) 50 MCG/ACT nasal spray    A total of 25 minutes of face to face time was spent with patient more than half of which was spent in counselling about the above mentioned conditions  and coordination of care    I have discontinued Ms. Ruscitti's multivitamin, montelukast, loratadine, and levofloxacin. I am also having her start on fluticasone. Additionally, I am having her maintain her aspirin, Probiotic, lisinopril-hydrochlorothiazide, and albuterol.  Meds ordered this encounter  Medications  . fluticasone (FLONASE ALLERGY RELIEF) 50 MCG/ACT nasal spray    Sig: Place 2 sprays into both nostrils daily.    Dispense:  16 g    Refill:  2    Medications Discontinued During This Encounter  Medication Reason  . levofloxacin (LEVAQUIN) 500 MG tablet Therapy completed  . loratadine  (CLARITIN) 10 MG tablet Patient has not taken in last 30 days  . montelukast (SINGULAIR) 10 MG tablet Patient has not taken in last 30 days  . Multiple Vitamin (MULTIVITAMIN) tablet Patient has not taken in last 30 days    Follow-up: Return in about 3 months (around 02/26/2017).   Crecencio Mc, MD

## 2016-11-28 NOTE — Assessment & Plan Note (Signed)
Triggered by allergic reaction to environmental allergies experienced at work.  topical steroid recommended , and daily use of saline lavage.

## 2016-11-28 NOTE — Assessment & Plan Note (Signed)
I have addressed  BMI and recommended wt loss of 10% of body weigh over the next 6 months using a low glycemic index diet and regular exercise a minimum of 5 days per week.   

## 2016-11-28 NOTE — Assessment & Plan Note (Signed)
Diet controlled  Reminded to have her annual eye exams,  Follow up every 3 months and pneumovax vaccine given   Her proteinuria has resolved with addition of ace inhibitor,  Lab Results  Component Value Date   MICROALBUR 1.9 08/17/2016   Lab Results  Component Value Date   HGBA1C 6.5 08/17/2016

## 2016-11-28 NOTE — Assessment & Plan Note (Signed)
Well controlled on current regimen. Renal function stable, no changes today. 

## 2017-02-22 ENCOUNTER — Other Ambulatory Visit: Payer: BC Managed Care – PPO

## 2017-02-22 ENCOUNTER — Telehealth: Payer: Self-pay | Admitting: Internal Medicine

## 2017-02-22 NOTE — Telephone Encounter (Signed)
LMTCB to let pt know that she will not need to give a urine specimen when she comes in for lab work.

## 2017-02-22 NOTE — Telephone Encounter (Signed)
Patient came in today to get her fasting labs. She had to reschedule d/t not being able to get anything drawn. She was questioning if she needs to give a urine sample since she usually gives one every time she gets her fasting labs. She is rescheduled for her labs on Thursday 11/1

## 2017-02-22 NOTE — Telephone Encounter (Signed)
Pt is aware.  

## 2017-02-22 NOTE — Addendum Note (Signed)
Addended by: Arby Barrette on: 02/22/2017 08:37 AM   Modules accepted: Orders

## 2017-02-24 ENCOUNTER — Other Ambulatory Visit: Payer: BC Managed Care – PPO

## 2017-02-24 NOTE — Addendum Note (Signed)
Addended by: Leeanne Rio on: 02/24/2017 08:35 AM   Modules accepted: Orders

## 2017-02-28 ENCOUNTER — Ambulatory Visit: Payer: BC Managed Care – PPO | Admitting: Internal Medicine

## 2017-03-01 ENCOUNTER — Other Ambulatory Visit
Admission: RE | Admit: 2017-03-01 | Discharge: 2017-03-01 | Disposition: A | Discharge status: Home / Self Care | Payer: BC Managed Care – PPO | Source: Ambulatory Visit | Attending: Internal Medicine | Admitting: Internal Medicine

## 2017-03-01 DIAGNOSIS — E1121 Type 2 diabetes mellitus with diabetic nephropathy: Secondary | ICD-10-CM | POA: Insufficient documentation

## 2017-03-01 DIAGNOSIS — I10 Essential (primary) hypertension: Secondary | ICD-10-CM | POA: Insufficient documentation

## 2017-03-01 LAB — COMPREHENSIVE METABOLIC PANEL
ALBUMIN: 4.2 g/dL (ref 3.5–5.0)
ALK PHOS: 97 U/L (ref 38–126)
ALT: 20 U/L (ref 14–54)
AST: 23 U/L (ref 15–41)
Anion gap: 8 (ref 5–15)
BUN: 21 mg/dL — AB (ref 6–20)
CALCIUM: 9.2 mg/dL (ref 8.9–10.3)
CHLORIDE: 105 mmol/L (ref 101–111)
CO2: 23 mmol/L (ref 22–32)
CREATININE: 0.83 mg/dL (ref 0.44–1.00)
GFR calc non Af Amer: >60 mL/min (ref >60)
GLUCOSE: 121 mg/dL — AB (ref 65–99)
Potassium: 4.3 mmol/L (ref 3.5–5.1)
SODIUM: 136 mmol/L (ref 135–145)
Total Bilirubin: 0.5 mg/dL (ref 0.3–1.2)
Total Protein: 7.1 g/dL (ref 6.5–8.1)

## 2017-03-01 LAB — LIPID PANEL
CHOLESTEROL: 158 mg/dL (ref 0–200)
HDL: 43 mg/dL (ref >40)
LDL Cholesterol: 94 mg/dL (ref 0–99)
Total CHOL/HDL Ratio: 3.7 RATIO
Triglycerides: 106 mg/dL (ref <150)
VLDL: 21 mg/dL (ref 0–40)

## 2017-03-01 LAB — HEMOGLOBIN A1C
Hgb A1c MFr Bld: 6.3 % — ABNORMAL HIGH (ref 4.8–5.6)
MEAN PLASMA GLUCOSE: 134.11 mg/dL

## 2017-03-02 ENCOUNTER — Encounter: Payer: Self-pay | Admitting: Internal Medicine

## 2017-03-07 ENCOUNTER — Ambulatory Visit (INDEPENDENT_AMBULATORY_CARE_PROVIDER_SITE_OTHER): Payer: BC Managed Care – PPO | Admitting: Internal Medicine

## 2017-03-07 ENCOUNTER — Other Ambulatory Visit: Payer: Self-pay

## 2017-03-07 ENCOUNTER — Encounter: Payer: Self-pay | Admitting: Internal Medicine

## 2017-03-07 VITALS — BP 100/76 | HR 79 | Temp 98.6°F | Resp 14 | Ht 63.0 in | Wt 198.2 lb

## 2017-03-07 DIAGNOSIS — Z23 Encounter for immunization: Secondary | ICD-10-CM | POA: Diagnosis not present

## 2017-03-07 DIAGNOSIS — J302 Other seasonal allergic rhinitis: Secondary | ICD-10-CM

## 2017-03-07 DIAGNOSIS — E1121 Type 2 diabetes mellitus with diabetic nephropathy: Secondary | ICD-10-CM

## 2017-03-07 DIAGNOSIS — I10 Essential (primary) hypertension: Secondary | ICD-10-CM | POA: Diagnosis not present

## 2017-03-07 MED ORDER — LISINOPRIL-HYDROCHLOROTHIAZIDE 10-12.5 MG PO TABS
1.0000 | ORAL_TABLET | Freq: Every day | ORAL | 3 refills | Status: DC
Start: 2017-03-07 — End: 2017-10-12

## 2017-03-07 MED ORDER — FLUTICASONE PROPIONATE 50 MCG/ACT NA SUSP: 2.0000 | Freq: Every day | NASAL | 2 refills | Status: DC

## 2017-03-07 NOTE — Progress Notes (Signed)
Subjective:  Patient ID: Nichole Jensen, female    DOB: 03-Dec-1960  Age: 56 y.o. MRN: [id number]  CC: The primary encounter diagnosis was Need for prophylactic vaccination against Streptococcus pneumoniae (pneumococcus). Diagnoses of Essential hypertension, Type II diabetes mellitus with nephropathy ([place]), and Seasonal allergies were also pertinent to this visit.  HPI Nichole Jensen presents for follow  Up on hypertension , obesity and other issues.   Allergic rhinitis:  Has been Using netti pot, flonase,  Zyrtec and singulair daily with good control of symptoms.  No symptoms currently   sleeping 7-8 hours per night, schedule  is much better  Starting to exercise  3 /week .  Eating high protein, low carb diet.   Less eating at night.  Denies joint pain ,  Energy level improving  Patient is taking her medications as prescribed and notes no adverse effects.  Home BP readings have been done about once per week and are  generally < 130/80 .  She is avoiding added salt in her diet and walking .   Outpatient Medications Prior to Visit  Medication Sig Dispense Refill  . albuterol (PROVENTIL HFA;VENTOLIN HFA) 108 (90 Base) MCG/ACT inhaler Inhale 2 puffs into the lungs every 6 (six) hours as needed for wheezing or shortness of breath. 1 Inhaler 3  . aspirin 81 MG tablet Take 81 mg by mouth daily.      . cetirizine (ZYRTEC) 10 MG tablet Take 10 mg daily by mouth.    . montelukast (SINGULAIR) 5 MG chewable tablet Chew 5 mg at bedtime by mouth.    . Probiotic CAPS Take 1 capsule by mouth.    . fluticasone (FLONASE ALLERGY RELIEF) 50 MCG/ACT nasal spray Place 2 sprays into both nostrils daily. 16 g 2  . lisinopril-hydrochlorothiazide (PRINZIDE,ZESTORETIC) 10-12.5 MG tablet Take 1 tablet by mouth daily. 90 tablet 3   No facility-administered medications prior to visit.     Review of Systems;  Patient denies headache, fevers, malaise, unintentional weight loss, skin rash, eye pain, sinus  congestion and sinus pain, sore throat, dysphagia,  hemoptysis , cough, dyspnea, wheezing, chest pain, palpitations, orthopnea, edema, abdominal pain, nausea, melena, diarrhea, constipation, flank pain, dysuria, hematuria, urinary  Frequency, nocturia, numbness, tingling, seizures,  Focal weakness, Loss of consciousness,  Tremor, insomnia, depression, anxiety, and suicidal ideation.      Objective:  BP 100/76 (BP Location: Left Arm, Patient Position: Sitting, Cuff Size: Normal)   Pulse 79   Temp 98.6 F (37 C) (Oral)   Resp 14   Ht 5\' 3"  (1.6 m)   Wt 198 lb 3.2 oz (89.9 kg)   SpO2 96%   BMI 35.11 kg/m   BP Readings from Last 3 Encounters:  03/07/17 100/76  11/26/16 102/76  11/03/16 118/80    Wt Readings from Last 3 Encounters:  03/07/17 198 lb 3.2 oz (89.9 kg)  11/26/16 198 lb 9.6 oz (90.1 kg)  11/03/16 198 lb 6 oz (90 kg)    General appearance: alert, cooperative and appears stated age Ears: normal TM's and external ear canals both ears Throat: lips, mucosa, and tongue normal; teeth and gums normal Neck: no adenopathy, no carotid bruit, supple, symmetrical, trachea midline and thyroid not enlarged, symmetric, no tenderness/mass/nodules Back: symmetric, no curvature. ROM normal. No CVA tenderness. Lungs: clear to auscultation bilaterally Heart: regular rate and rhythm, S1, S2 normal, no murmur, click, rub or gallop Abdomen: soft, non-tender; bowel sounds normal; no masses,  no organomegaly Pulses: 2+ and symmetric  Skin: Skin color, texture, turgor normal. No rashes or lesions Lymph nodes: Cervical, supraclavicular, and axillary nodes normal.  Lab Results  Component Value Date   HGBA1C 6.3 (H) 03/01/2017   HGBA1C 6.5 08/17/2016   HGBA1C 6.6 (H) 05/18/2016    Lab Results  Component Value Date   CREATININE 0.83 03/01/2017   CREATININE 0.83 08/17/2016   CREATININE 0.78 05/18/2016    Lab Results  Component Value Date   WBC 7.7 08/17/2016   HGB 13.7 08/17/2016    HCT 40.9 08/17/2016   PLT 216.0 08/17/2016   GLUCOSE 121 (H) 03/01/2017   CHOL 158 03/01/2017   TRIG 106 03/01/2017   HDL 43 03/01/2017   LDLDIRECT 109.0 08/17/2016   LDLCALC 94 03/01/2017   ALT 20 03/01/2017   AST 23 03/01/2017   NA 136 03/01/2017   K 4.3 03/01/2017   CL 105 03/01/2017   CREATININE 0.83 03/01/2017   BUN 21 (H) 03/01/2017   CO2 23 03/01/2017   TSH 2.02 02/12/2016   HGBA1C 6.3 (H) 03/01/2017   MICROALBUR 1.9 08/17/2016    No results found.  Assessment & Plan:   Problem List Items Addressed This Visit    Essential hypertension    Well controlled on current regimen. Renal function stable, no changes today.  Lab Results  Component Value Date   CREATININE 0.83 03/01/2017   Lab Results  Component Value Date   NA 136 03/01/2017   K 4.3 03/01/2017   CL 105 03/01/2017   CO2 23 03/01/2017         Relevant Medications   lisinopril-hydrochlorothiazide (PRINZIDE,ZESTORETIC) 10-12.5 MG tablet   Seasonal allergies    Managed with flonase, zyrtec, singulair and saline irrigation       Type II diabetes mellitus with nephropathy ([place])    Diet controlled.   Reminded to have her annual eye exams,  Follow up every 3 months and pneumovax vaccine given   Her proteinuria has resolved with addition of ace inhibitor,  Lab Results  Component Value Date   MICROALBUR 1.9 08/17/2016   Lab Results  Component Value Date   HGBA1C 6.3 (H) 03/01/2017         Relevant Medications   lisinopril-hydrochlorothiazide (PRINZIDE,ZESTORETIC) 10-12.5 MG tablet    Other Visit Diagnoses    Need for prophylactic vaccination against Streptococcus pneumoniae (pneumococcus)    -  Primary   Relevant Orders   Pneumococcal polysaccharide vaccine 23-valent greater than or equal to 2yo subcutaneous/IM (Completed)      I have changed Barnetta [facility] E. Kaiser's fluticasone and lisinopril-hydrochlorothiazide. I am also having her maintain her aspirin, Probiotic, albuterol, cetirizine, and  montelukast.  Meds ordered this encounter  Medications  . cetirizine (ZYRTEC) 10 MG tablet    Sig: Take 10 mg daily by mouth.  . montelukast (SINGULAIR) 5 MG chewable tablet    Sig: Chew 5 mg at bedtime by mouth.  . fluticasone (FLONASE ALLERGY RELIEF) 50 MCG/ACT nasal spray    Sig: Place 2 sprays daily into both nostrils.    Dispense:  16 g    Refill:  2  . lisinopril-hydrochlorothiazide (PRINZIDE,ZESTORETIC) 10-12.5 MG tablet    Sig: Take 1 tablet daily by mouth.    Dispense:  90 tablet    Refill:  3    Medications Discontinued During This Encounter  Medication Reason  . fluticasone (FLONASE ALLERGY RELIEF) 50 MCG/ACT nasal spray Reorder  . lisinopril-hydrochlorothiazide (PRINZIDE,ZESTORETIC) 10-12.5 MG tablet Reorder    Follow-up: No Follow-up on file.  Crecencio Mc, MD

## 2017-03-07 NOTE — Patient Instructions (Addendum)
Your labs are excellent!    The ShingRx vaccine is now available in local pharmacies and is much more protective thant Zostavaxs,  It is therefore ADVISED for all interested adults over 50 to prevent shingles  You received the Pneumovax vaccine today     Let me know in the future when you want to do labs at [facility]  (6 months)   See you in 3 months

## 2017-03-08 NOTE — Assessment & Plan Note (Signed)
Well controlled on current regimen. Renal function stable, no changes today.  Lab Results  Component Value Date   CREATININE 0.83 03/01/2017   Lab Results  Component Value Date   NA 136 03/01/2017   K 4.3 03/01/2017   CL 105 03/01/2017   CO2 23 03/01/2017

## 2017-03-08 NOTE — Assessment & Plan Note (Signed)
Diet controlled.   Reminded to have her annual eye exams,  Follow up every 3 months and pneumovax vaccine given   Her proteinuria has resolved with addition of ace inhibitor,  Lab Results  Component Value Date   MICROALBUR 1.9 08/17/2016   Lab Results  Component Value Date   HGBA1C 6.3 (H) 03/01/2017

## 2017-03-08 NOTE — Assessment & Plan Note (Signed)
Managed with flonase, zyrtec, singulair and saline irrigation

## 2017-03-23 ENCOUNTER — Other Ambulatory Visit: Payer: Self-pay | Admitting: Internal Medicine

## 2017-04-13 LAB — HM DIABETES EYE EXAM

## 2017-04-13 LAB — HM DIABETES FOOT EXAM: HM Diabetic Foot Exam: NORMAL

## 2017-06-20 ENCOUNTER — Telehealth: Payer: Self-pay | Admitting: Internal Medicine

## 2017-06-20 ENCOUNTER — Ambulatory Visit: Payer: BC Managed Care – PPO | Admitting: Internal Medicine

## 2017-06-20 ENCOUNTER — Encounter: Payer: Self-pay | Admitting: Internal Medicine

## 2017-06-20 VITALS — BP 104/72 | HR 88 | Temp 98.2°F | Resp 15 | Ht 63.0 in | Wt 198.0 lb

## 2017-06-20 DIAGNOSIS — Z1239 Encounter for other screening for malignant neoplasm of breast: Secondary | ICD-10-CM

## 2017-06-20 DIAGNOSIS — I10 Essential (primary) hypertension: Secondary | ICD-10-CM

## 2017-06-20 DIAGNOSIS — E1121 Type 2 diabetes mellitus with diabetic nephropathy: Secondary | ICD-10-CM

## 2017-06-20 DIAGNOSIS — Z1231 Encounter for screening mammogram for malignant neoplasm of breast: Secondary | ICD-10-CM | POA: Diagnosis not present

## 2017-06-20 DIAGNOSIS — Z23 Encounter for immunization: Secondary | ICD-10-CM

## 2017-06-20 DIAGNOSIS — E669 Obesity, unspecified: Secondary | ICD-10-CM

## 2017-06-20 DIAGNOSIS — E785 Hyperlipidemia, unspecified: Secondary | ICD-10-CM

## 2017-06-20 NOTE — Patient Instructions (Signed)
You are doing great!  Do not worry about the weight. Focus on working up to exercising 5 days per week,  and following the diet     You received the first dose of the The ShingRx vaccine .  You will get your second dose when you return in May for labs and follow up

## 2017-06-20 NOTE — Telephone Encounter (Signed)
Labs have been reordered so pt can get her labs drawn at [facility].

## 2017-06-20 NOTE — Telephone Encounter (Signed)
Pt saw Dr. Derrel Nip today, Derrel Nip wants to see her back in 3 months and labs before office visit. Pt said she has to go to the hospital to have labs done. Does pt call hospital and are the lab orders set up for [facility].

## 2017-06-20 NOTE — Progress Notes (Signed)
Subjective:  Patient ID: Nichole Jensen, female    DOB: 09/21/1960  Age: 57 y.o. MRN: [id number]  CC: The primary encounter diagnosis was Breast cancer screening. Diagnoses of Type II diabetes mellitus with nephropathy ([place]), Need for shingles vaccine, Essential hypertension, and Obesity (BMI 30-39.9) were also pertinent to this visit.  HPI Nichole Jensen presents for 3 month follow up on diabetes.  Patient has no complaints today.  Patient is following a low glycemic index diet and taking all prescribed medications regularly without side effects.  Does not check sugars. .  following a carb restricted diet.  Has gone down a size  And feels better on the low carb  Patient is exercising about 3 times per week and intentionally trying to lose weight .  Patient has had an eye exam in the last 12 months and checks feet regularly for signs of infection.  Patient does not walk barefoot outside,  And denies an numbness tingling or burning in feet. Patient is up to date on all recommended vaccinations   Colon normal 2014 Mamm ordered  hysterectomy , non cancerous ([place])   Discussed shingrx.  3 cases near her at work . Wants the vaccine   Discussed father's heart condition .  Dropped dead at 48.  Non smoker.       Outpatient Medications Prior to Visit  Medication Sig Dispense Refill  . albuterol (PROVENTIL HFA;VENTOLIN HFA) 108 (90 Base) MCG/ACT inhaler Inhale 2 puffs into the lungs every 6 (six) hours as needed for wheezing or shortness of breath. 1 Inhaler 3  . aspirin 81 MG tablet Take 81 mg by mouth daily.      . cetirizine (ZYRTEC) 10 MG tablet Take 10 mg daily by mouth.    . fluticasone (FLONASE ALLERGY RELIEF) 50 MCG/ACT nasal spray Place 2 sprays daily into both nostrils. 16 g 2  . lisinopril-hydrochlorothiazide (PRINZIDE,ZESTORETIC) 10-12.5 MG tablet Take 1 tablet daily by mouth. 90 tablet 3  . montelukast (SINGULAIR) 10 MG tablet TAKE 1 TABLET BY MOUTH AT BEDTIME 90 tablet 1  .  montelukast (SINGULAIR) 5 MG chewable tablet Chew 5 mg at bedtime by mouth.    . Probiotic CAPS Take 1 capsule by mouth.     No facility-administered medications prior to visit.     Review of Systems;  Patient denies headache, fevers, malaise, unintentional weight loss, skin rash, eye pain, sinus congestion and sinus pain, sore throat, dysphagia,  hemoptysis , cough, dyspnea, wheezing, chest pain, palpitations, orthopnea, edema, abdominal pain, nausea, melena, diarrhea, constipation, flank pain, dysuria, hematuria, urinary  Frequency, nocturia, numbness, tingling, seizures,  Focal weakness, Loss of consciousness,  Tremor, insomnia, depression, anxiety, and suicidal ideation.      Objective:  BP 104/72 (BP Location: Left Arm, Patient Position: Sitting, Cuff Size: Normal)   Pulse 88   Temp 98.2 F (36.8 C) (Oral)   Resp 15   Ht 5\' 3"  (1.6 m)   Wt 198 lb (89.8 kg)   SpO2 96%   BMI 35.07 kg/m   BP Readings from Last 3 Encounters:  06/20/17 104/72  03/07/17 100/76  11/26/16 102/76    Wt Readings from Last 3 Encounters:  06/20/17 198 lb (89.8 kg)  03/07/17 198 lb 3.2 oz (89.9 kg)  11/26/16 198 lb 9.6 oz (90.1 kg)    General appearance: alert, cooperative and appears stated age Ears: normal TM's and external ear canals both ears Throat: lips, mucosa, and tongue normal; teeth and gums normal Neck: no  adenopathy, no carotid bruit, supple, symmetrical, trachea midline and thyroid not enlarged, symmetric, no tenderness/mass/nodules Back: symmetric, no curvature. ROM normal. No CVA tenderness. Lungs: clear to auscultation bilaterally Heart: regular rate and rhythm, S1, S2 normal, no murmur, click, rub or gallop Abdomen: soft, non-tender; bowel sounds normal; no masses,  no organomegaly Pulses: 2+ and symmetric Skin: Skin color, texture, turgor normal. No rashes or lesions Lymph nodes: Cervical, supraclavicular, and axillary nodes normal.  Lab Results  Component Value Date    HGBA1C 6.3 (H) 03/01/2017   HGBA1C 6.5 08/17/2016   HGBA1C 6.6 (H) 05/18/2016    Lab Results  Component Value Date   CREATININE 0.83 03/01/2017   CREATININE 0.83 08/17/2016   CREATININE 0.78 05/18/2016    Lab Results  Component Value Date   WBC 7.7 08/17/2016   HGB 13.7 08/17/2016   HCT 40.9 08/17/2016   PLT 216.0 08/17/2016   GLUCOSE 121 (H) 03/01/2017   CHOL 158 03/01/2017   TRIG 106 03/01/2017   HDL 43 03/01/2017   LDLDIRECT 109.0 08/17/2016   LDLCALC 94 03/01/2017   ALT 20 03/01/2017   AST 23 03/01/2017   NA 136 03/01/2017   K 4.3 03/01/2017   CL 105 03/01/2017   CREATININE 0.83 03/01/2017   BUN 21 (H) 03/01/2017   CO2 23 03/01/2017   TSH 2.02 02/12/2016   HGBA1C 6.3 (H) 03/01/2017   MICROALBUR 1.9 08/17/2016    No results found.  Assessment & Plan:   Problem List Items Addressed This Visit    Essential hypertension    Well controlled on current regimen. Renal function stable, no changes today. Lab Results  Component Value Date   CREATININE 0.83 03/01/2017   Lab Results  Component Value Date   NA 136 03/01/2017   K 4.3 03/01/2017   CL 105 03/01/2017   CO2 23 03/01/2017         Obesity (BMI 30-39.9)    I have congratulated her in her lifestyle changes with adding exercise and following  A ow carb diet with the goal of weight loss with goal of 10% of body weigh over the next 6 months using a low glycemic index diet and regular exercise a minimum of 5 days per week.        Type II diabetes mellitus with nephropathy (HCC)    Diet controlled.   Reminded to have her annual eye exams,  Follow up every 3 months and pneumovax vaccine given   Her proteinuria has resolved with addition of ace inhibitor,  Lab Results  Component Value Date   MICROALBUR 1.9 08/17/2016   Lab Results  Component Value Date   HGBA1C 6.3 (H) 03/01/2017          Other Visit Diagnoses    Breast cancer screening    -  Primary   Relevant Orders   MM SCREENING BREAST TOMO  BILATERAL   Need for shingles vaccine       Relevant Orders   Varicella-zoster vaccine IM (Shingrix) (Completed)    A total of 25 minutes of face to face time was spent with patient more than half of which was spent in counselling about the above mentioned conditions  and coordination of care   I have discontinued Barnetta Chapel E. Genther's Probiotic. I am also having her maintain her aspirin, albuterol, cetirizine, fluticasone, lisinopril-hydrochlorothiazide, and montelukast.  No orders of the defined types were placed in this encounter.   Medications Discontinued During This Encounter  Medication Reason  . montelukast (  SINGULAIR) 5 MG chewable tablet Duplicate  . Probiotic CAPS Patient has not taken in last 30 days    Follow-up: Return in about 3 months (around 09/17/2017) for fasting labs prior to appointment .   Crecencio Mc, MD

## 2017-06-21 NOTE — Assessment & Plan Note (Signed)
Well controlled on current regimen. Renal function stable, no changes today. Lab Results  Component Value Date   CREATININE 0.83 03/01/2017   Lab Results  Component Value Date   NA 136 03/01/2017   K 4.3 03/01/2017   CL 105 03/01/2017   CO2 23 03/01/2017

## 2017-06-21 NOTE — Assessment & Plan Note (Signed)
Diet controlled.   Reminded to have her annual eye exams,  Follow up every 3 months and pneumovax vaccine given   Her proteinuria has resolved with addition of ace inhibitor,  Lab Results  Component Value Date   MICROALBUR 1.9 08/17/2016   Lab Results  Component Value Date   HGBA1C 6.3 (H) 03/01/2017

## 2017-06-21 NOTE — Assessment & Plan Note (Signed)
I have congratulated her in her lifestyle changes with adding exercise and following  A ow carb diet with the goal of weight loss with goal of 10% of body weigh over the next 6 months using a low glycemic index diet and regular exercise a minimum of 5 days per week.   

## 2017-08-04 ENCOUNTER — Encounter: Payer: Self-pay | Admitting: Internal Medicine

## 2017-08-05 NOTE — Telephone Encounter (Signed)
Patient scheduled.

## 2017-08-10 ENCOUNTER — Encounter: Payer: Self-pay | Admitting: Internal Medicine

## 2017-08-10 ENCOUNTER — Ambulatory Visit: Payer: BC Managed Care – PPO | Admitting: Internal Medicine

## 2017-08-10 DIAGNOSIS — I10 Essential (primary) hypertension: Secondary | ICD-10-CM

## 2017-08-10 DIAGNOSIS — N644 Mastodynia: Secondary | ICD-10-CM

## 2017-08-10 MED ORDER — SPIRONOLACTONE 25 MG PO TABS: 25.0000 mg | ORAL_TABLET | Freq: Every day | ORAL | 0 refills | Status: DC

## 2017-08-10 NOTE — Progress Notes (Signed)
Subjective:  Patient ID: Nichole Jensen, female    DOB: 1961-04-11  Age: 57 y.o. MRN: [id number]  CC: Diagnoses of Breast tenderness and Essential hypertension were pertinent to this visit.  HPI Nichole Jensen presents for evaluati onof left breast pain that  has been present for one week . The pain was first felt on the inferior side of the breast,  Then spread to include the left nipple and felt itching and burning.  She also noted that the veins on the lateral side of her breast appeared more prominent. .  She denies symptoms on the contralateral side.  She has a history of fibrocystic breast disease that was diagnosed  Over 20 years ago.  Right breast lump aspirated then biopsied in 2001 ,  All benign . Hysterectomy 2005  3 yrs ago mammograms "dense breasts" started having 3D and ultrasounds,  Referred to [facility] .  Has appt with Byerly next week.    Outpatient Medications Prior to Visit  Medication Sig Dispense Refill  . albuterol (PROVENTIL HFA;VENTOLIN HFA) 108 (90 Base) MCG/ACT inhaler Inhale 2 puffs into the lungs every 6 (six) hours as needed for wheezing or shortness of breath. 1 Inhaler 3  . aspirin 81 MG tablet Take 81 mg by mouth daily.      . cetirizine (ZYRTEC) 10 MG tablet Take 10 mg daily by mouth.    . fluticasone (FLONASE ALLERGY RELIEF) 50 MCG/ACT nasal spray Place 2 sprays daily into both nostrils. 16 g 2  . lisinopril-hydrochlorothiazide (PRINZIDE,ZESTORETIC) 10-12.5 MG tablet Take 1 tablet daily by mouth. 90 tablet 3  . montelukast (SINGULAIR) 10 MG tablet TAKE 1 TABLET BY MOUTH AT BEDTIME 90 tablet 1   No facility-administered medications prior to visit.     Review of Systems;  Patient denies headache, fevers, malaise, unintentional weight loss, skin rash, eye pain, sinus congestion and sinus pain, sore throat, dysphagia,  hemoptysis , cough, dyspnea, wheezing, chest pain, palpitations, orthopnea, edema, abdominal pain, nausea, melena, diarrhea, constipation,  flank pain, dysuria, hematuria, urinary  Frequency, nocturia, numbness, tingling, seizures,  Focal weakness, Loss of consciousness,  Tremor, insomnia, depression, anxiety, and suicidal ideation.      Objective:  BP 104/78 (BP Location: Left Arm, Patient Position: Sitting, Cuff Size: Normal)   Pulse 77   Temp 98 F (36.7 C) (Oral)   Resp 15   Ht 5\' 3"  (1.6 m)   Wt 200 lb 12.8 oz (91.1 kg)   SpO2 97%   BMI 35.57 kg/m   BP Readings from Last 3 Encounters:  08/10/17 104/78  06/20/17 104/72  03/07/17 100/76    Wt Readings from Last 3 Encounters:  08/10/17 200 lb 12.8 oz (91.1 kg)  06/20/17 198 lb (89.8 kg)  03/07/17 198 lb 3.2 oz (89.9 kg)    General appearance: alert, cooperative and appears stated age  Lungs: clear to auscultation bilaterally Heart: regular rate and rhythm, S1, S2 normal, no murmur, click, rub or gallop Breasts:  No masses,  Nipple changes Lymph nodes: Cervical, supraclavicular, and axillary nodes normal.  Lab Results  Component Value Date   HGBA1C 6.3 (H) 03/01/2017   HGBA1C 6.5 08/17/2016   HGBA1C 6.6 (H) 05/18/2016    Lab Results  Component Value Date   CREATININE 0.83 03/01/2017   CREATININE 0.83 08/17/2016   CREATININE 0.78 05/18/2016    Lab Results  Component Value Date   WBC 7.7 08/17/2016   HGB 13.7 08/17/2016   HCT 40.9 08/17/2016   PLT  216.0 08/17/2016   GLUCOSE 121 (H) 03/01/2017   CHOL 158 03/01/2017   TRIG 106 03/01/2017   HDL 43 03/01/2017   LDLDIRECT 109.0 08/17/2016   LDLCALC 94 03/01/2017   ALT 20 03/01/2017   AST 23 03/01/2017   NA 136 03/01/2017   K 4.3 03/01/2017   CL 105 03/01/2017   CREATININE 0.83 03/01/2017   BUN 21 (H) 03/01/2017   CO2 23 03/01/2017   TSH 2.02 02/12/2016   HGBA1C 6.3 (H) 03/01/2017   MICROALBUR 1.9 08/17/2016    No results found.  Assessment & Plan:   Problem List Items Addressed This Visit    Essential hypertension    Advised to reduce lisinopril dose to 1/2 while taking a trial of  spironolactone      Relevant Medications   spironolactone (ALDACTONE) 25 MG tablet   Breast tenderness     She has no masses on exam today.  Trial of spironolactone for possible fluid retention.  Advised to keep appt with Dr Barry Dienes next week.           I am having Nichole Jensen start on spironolactone. I am also having her maintain her aspirin, albuterol, cetirizine, fluticasone, lisinopril-hydrochlorothiazide, and montelukast.  Meds ordered this encounter  Medications  . spironolactone (ALDACTONE) 25 MG tablet    Sig: Take 1 tablet (25 mg total) by mouth daily.    Dispense:  30 tablet    Refill:  0    There are no discontinued medications.  Follow-up: No follow-ups on file.   Crecencio Mc, MD

## 2017-08-10 NOTE — Patient Instructions (Signed)
I do not think you have a mass  I think your breasts are tender from fluid retention and fibrocystic breast disease  Trial of caffeine restriction  And daily use of spironolactone for one week  Keep appt with Byerly  Reduce the lisinopril to 1/2 tablet daily during this time to avoid hypotension

## 2017-08-11 DIAGNOSIS — N644 Mastodynia: Secondary | ICD-10-CM | POA: Insufficient documentation

## 2017-08-11 NOTE — Assessment & Plan Note (Signed)
Advised to reduce lisinopril dose to 1/2 while taking a trial of spironolactone

## 2017-08-11 NOTE — Assessment & Plan Note (Signed)
She has no masses on exam today.  Trial of spironolactone for possible fluid retention.  Advised to keep appt with Dr Barry Dienes next week.

## 2017-08-22 ENCOUNTER — Encounter: Payer: Self-pay | Admitting: Genetic Counselor

## 2017-08-22 ENCOUNTER — Telehealth: Payer: Self-pay | Admitting: Genetic Counselor

## 2017-08-22 NOTE — Telephone Encounter (Signed)
A genetic counseling appt has been scheduled for the pt to see Roma Kayser on 6/19 at 1pm. Pt aware to arrive 30 minutes early. Letter mailed.

## 2017-09-01 ENCOUNTER — Other Ambulatory Visit: Payer: Self-pay | Admitting: Internal Medicine

## 2017-09-06 ENCOUNTER — Ambulatory Visit
Admission: RE | Admit: 2017-09-06 | Discharge: 2017-09-06 | Disposition: A | Discharge status: Home / Self Care | Payer: BC Managed Care – PPO | Source: Ambulatory Visit | Attending: Internal Medicine | Admitting: Internal Medicine

## 2017-09-06 DIAGNOSIS — Z1231 Encounter for screening mammogram for malignant neoplasm of breast: Secondary | ICD-10-CM | POA: Insufficient documentation

## 2017-09-06 DIAGNOSIS — Z1239 Encounter for other screening for malignant neoplasm of breast: Secondary | ICD-10-CM

## 2017-09-07 ENCOUNTER — Other Ambulatory Visit: Payer: Self-pay | Admitting: Internal Medicine

## 2017-09-07 DIAGNOSIS — N644 Mastodynia: Secondary | ICD-10-CM

## 2017-09-28 ENCOUNTER — Ambulatory Visit
Admission: RE | Admit: 2017-09-28 | Discharge: 2017-09-28 | Disposition: A | Discharge status: Home / Self Care | Payer: BC Managed Care – PPO | Source: Ambulatory Visit | Attending: Internal Medicine | Admitting: Internal Medicine

## 2017-09-28 DIAGNOSIS — N644 Mastodynia: Secondary | ICD-10-CM | POA: Insufficient documentation

## 2017-10-07 ENCOUNTER — Other Ambulatory Visit
Admission: RE | Admit: 2017-10-07 | Discharge: 2017-10-07 | Disposition: A | Discharge status: Home / Self Care | Payer: BC Managed Care – PPO | Source: Ambulatory Visit | Attending: Internal Medicine | Admitting: Internal Medicine

## 2017-10-07 DIAGNOSIS — I10 Essential (primary) hypertension: Secondary | ICD-10-CM | POA: Diagnosis present

## 2017-10-07 DIAGNOSIS — E785 Hyperlipidemia, unspecified: Secondary | ICD-10-CM | POA: Insufficient documentation

## 2017-10-07 DIAGNOSIS — E1121 Type 2 diabetes mellitus with diabetic nephropathy: Secondary | ICD-10-CM | POA: Insufficient documentation

## 2017-10-07 LAB — HEMOGLOBIN A1C
HEMOGLOBIN A1C: 6.1 % — AB (ref 4.8–5.6)
Mean Plasma Glucose: 128.37 mg/dL

## 2017-10-07 LAB — LIPID PANEL
CHOLESTEROL: 173 mg/dL (ref 0–200)
HDL: 45 mg/dL (ref >40)
LDL Cholesterol: 99 mg/dL (ref 0–99)
Total CHOL/HDL Ratio: 3.8 RATIO
Triglycerides: 146 mg/dL (ref <150)
VLDL: 29 mg/dL (ref 0–40)

## 2017-10-07 LAB — COMPREHENSIVE METABOLIC PANEL
ALBUMIN: 4.5 g/dL (ref 3.5–5.0)
ALK PHOS: 85 U/L (ref 38–126)
ALT: 18 U/L (ref 14–54)
ANION GAP: 8 (ref 5–15)
AST: 18 U/L (ref 15–41)
BUN: 35 mg/dL — AB (ref 6–20)
CALCIUM: 9 mg/dL (ref 8.9–10.3)
CO2: 22 mmol/L (ref 22–32)
Chloride: 104 mmol/L (ref 101–111)
Creatinine, Ser: 1.02 mg/dL — ABNORMAL HIGH (ref 0.44–1.00)
GFR calc Af Amer: >60 mL/min (ref >60)
GFR calc non Af Amer: 60 mL/min — ABNORMAL LOW (ref >60)
GLUCOSE: 121 mg/dL — AB (ref 65–99)
Potassium: 4.2 mmol/L (ref 3.5–5.1)
SODIUM: 134 mmol/L — AB (ref 135–145)
Total Bilirubin: 0.5 mg/dL (ref 0.3–1.2)
Total Protein: 7.3 g/dL (ref 6.5–8.1)

## 2017-10-08 LAB — MICROALBUMIN / CREATININE URINE RATIO
CREATININE, UR: 96.7 mg/dL
MICROALB/CREAT RATIO: 45.5 mg/g{creat} — AB (ref 0.0–30.0)
Microalb, Ur: 44 ug/mL — ABNORMAL HIGH

## 2017-10-12 ENCOUNTER — Inpatient Hospital Stay: Payer: BC Managed Care – PPO

## 2017-10-12 ENCOUNTER — Ambulatory Visit: Payer: BC Managed Care – PPO | Admitting: Internal Medicine

## 2017-10-12 ENCOUNTER — Encounter: Payer: Self-pay | Admitting: Genetic Counselor

## 2017-10-12 ENCOUNTER — Inpatient Hospital Stay: Payer: BC Managed Care – PPO | Attending: Genetic Counselor | Admitting: Genetic Counselor

## 2017-10-12 VITALS — BP 126/70 | HR 70 | Temp 98.3°F | Resp 18 | Wt 199.4 lb

## 2017-10-12 DIAGNOSIS — N644 Mastodynia: Secondary | ICD-10-CM

## 2017-10-12 DIAGNOSIS — J302 Other seasonal allergic rhinitis: Secondary | ICD-10-CM

## 2017-10-12 DIAGNOSIS — Z808 Family history of malignant neoplasm of other organs or systems: Secondary | ICD-10-CM | POA: Insufficient documentation

## 2017-10-12 DIAGNOSIS — Z803 Family history of malignant neoplasm of breast: Secondary | ICD-10-CM | POA: Diagnosis not present

## 2017-10-12 DIAGNOSIS — Z7183 Encounter for nonprocreative genetic counseling: Secondary | ICD-10-CM

## 2017-10-12 DIAGNOSIS — I10 Essential (primary) hypertension: Secondary | ICD-10-CM

## 2017-10-12 DIAGNOSIS — E669 Obesity, unspecified: Secondary | ICD-10-CM | POA: Diagnosis not present

## 2017-10-12 DIAGNOSIS — Z800 Family history of malignant neoplasm of digestive organs: Secondary | ICD-10-CM

## 2017-10-12 DIAGNOSIS — E1121 Type 2 diabetes mellitus with diabetic nephropathy: Secondary | ICD-10-CM | POA: Diagnosis not present

## 2017-10-12 MED ORDER — LISINOPRIL-HYDROCHLOROTHIAZIDE 10-12.5 MG PO TABS: 1.0000 | ORAL_TABLET | Freq: Every day | ORAL | 3 refills | Status: DC

## 2017-10-12 NOTE — Assessment & Plan Note (Signed)
Left breast US negative.  3 month follow up iwht BYerley in July due to [place] ob BRCA in brother.  MRI for dense breasts also offered

## 2017-10-12 NOTE — Progress Notes (Signed)
Subjective:  Patient ID: Nichole Jensen, female    DOB: [birth year]  Age: 57 y.o. MRN: [id number]  CC: The primary encounter diagnosis was Type II diabetes mellitus with nephropathy ([place]). Diagnoses of Breast tenderness, Seasonal allergies, Essential hypertension, and Obesity (BMI 30-39.9) were also pertinent to this visit.  HPI Nichole Jensen presents for follow up on Type 2 DM, hypertension obesity.   Left breast eval for pain negative by ultrasound/diagnositc mammogram   I nApril.  FH of brease cancer  in brother,  Has 3 month follow up with Byerly.  Discussed the MRI of breast which may be an option.  She has eliminated caffeine and reduced salt in diet  Breast pain has improved significantly .  Genetic  counselling is set up for today    Recent labs reviewed,  Incr cr and urine for  Microalbuminuria new onset  discussed if due to 12 hour fast    First cousin diagnosed with polycythemia and had a TIA .  Sister has also been diagnosed  with secondary polycythemia due to OSA>    Has started a regular exercise program.  Walking every day for the last 2 weeks    Outpatient Medications Prior to Visit  Medication Sig Dispense Refill  . albuterol (PROVENTIL HFA;VENTOLIN HFA) 108 (90 Base) MCG/ACT inhaler Inhale 2 puffs into the lungs every 6 (six) hours as needed for wheezing or shortness of breath. 1 Inhaler 3  . aspirin 81 MG tablet Take 81 mg by mouth daily.      . cetirizine (ZYRTEC) 10 MG tablet Take 10 mg daily by mouth.    . fluticasone (FLONASE ALLERGY RELIEF) 50 MCG/ACT nasal spray Place 2 sprays daily into both nostrils. 16 g 2  . montelukast (SINGULAIR) 10 MG tablet TAKE 1 TABLET BY MOUTH AT BEDTIME 90 tablet 1  . lisinopril-hydrochlorothiazide (PRINZIDE,ZESTORETIC) 10-12.5 MG tablet Take 1 tablet daily by mouth. 90 tablet 3  . spironolactone (ALDACTONE) 25 MG tablet TAKE 1 TABLET BY MOUTH EVERY DAY (Patient not taking: Reported on 10/12/2017) 90 tablet 1   No  facility-administered medications prior to visit.     Review of Systems;  Patient denies headache, fevers, malaise, unintentional weight loss, skin rash, eye pain, sinus congestion and sinus pain, sore throat, dysphagia,  hemoptysis , cough, dyspnea, wheezing, chest pain, palpitations, orthopnea, edema, abdominal pain, nausea, melena, diarrhea, constipation, flank pain, dysuria, hematuria, urinary  Frequency, nocturia, numbness, tingling, seizures,  Focal weakness, Loss of consciousness,  Tremor, insomnia, depression, anxiety, and suicidal ideation.      Objective:  BP 126/70 (BP Location: Left Arm, Patient Position: Sitting, Cuff Size: Normal)   Pulse 70   Temp 98.3 F (36.8 C) (Oral)   Resp 18   Wt 199 lb 6.4 oz (90.4 kg)   SpO2 98%   BMI 35.32 kg/m   BP Readings from Last 3 Encounters:  10/12/17 126/70  08/10/17 104/78  06/20/17 104/72    Wt Readings from Last 3 Encounters:  10/12/17 199 lb 6.4 oz (90.4 kg)  08/10/17 200 lb 12.8 oz (91.1 kg)  06/20/17 198 lb (89.8 kg)    General appearance: alert, cooperative and appears stated age Ears: normal TM's and external ear canals both ears Throat: lips, mucosa, and tongue normal; teeth and gums normal Neck: no adenopathy, no carotid bruit, supple, symmetrical, trachea midline and thyroid not enlarged, symmetric, no tenderness/mass/nodules Back: symmetric, no curvature. ROM normal. No CVA tenderness. Lungs: clear to auscultation bilaterally Heart: regular rate and  rhythm, S1, S2 normal, no murmur, click, rub or gallop Abdomen: soft, non-tender; bowel sounds normal; no masses,  no organomegaly Pulses: 2+ and symmetric Skin: Skin color, texture, turgor normal. No rashes or lesions Lymph nodes: Cervical, supraclavicular, and axillary nodes normal.  Lab Results  Component Value Date   HGBA1C 6.1 (H) 10/07/2017   HGBA1C 6.3 (H) 03/01/2017   HGBA1C 6.5 08/17/2016    Lab Results  Component Value Date   CREATININE 1.02 (H)  10/07/2017   CREATININE 0.83 03/01/2017   CREATININE 0.83 08/17/2016    Lab Results  Component Value Date   WBC 7.7 08/17/2016   HGB 13.7 08/17/2016   HCT 40.9 08/17/2016   PLT 216.0 08/17/2016   GLUCOSE 121 (H) 10/07/2017   CHOL 173 10/07/2017   TRIG 146 10/07/2017   HDL 45 10/07/2017   LDLDIRECT 109.0 08/17/2016   LDLCALC 99 10/07/2017   ALT 18 10/07/2017   AST 18 10/07/2017   NA 134 (L) 10/07/2017   K 4.2 10/07/2017   CL 104 10/07/2017   CREATININE 1.02 (H) 10/07/2017   BUN 35 (H) 10/07/2017   CO2 22 10/07/2017   TSH 2.02 02/12/2016   HGBA1C 6.1 (H) 10/07/2017   MICROALBUR 44.0 (H) 10/07/2017    No results found.  Assessment & Plan:   Problem List Items Addressed This Visit    Seasonal allergies   Relevant Orders   CBC with Differential/Platelet   Breast tenderness    Left breast US negative.  3 month follow up iwht BYerley in July due to FH ob BRCA in brother.  MRI for dense breasts also offered       Type II diabetes mellitus with nephropathy (HCC) - Primary    Diet controlled.   Reminded to have her annual eye exams,  Follow up every 3 months and pneumovax vaccine given   Her proteinuria has been addressed with addition of ace inhibitor,  Lab Results  Component Value Date   MICROALBUR 44.0 (H) 10/07/2017   Lab Results  Component Value Date   HGBA1C 6.1 (H) 10/07/2017         Relevant Medications   lisinopril-hydrochlorothiazide (PRINZIDE,ZESTORETIC) 10-12.5 MG tablet   Other Relevant Orders   Hemoglobin A1c   Comprehensive metabolic panel   Lipid panel   Obesity (BMI 30-39.9)    I have congratulated her in her lifestyle changes with adding exercise and following  A ow carb diet with the goal of weight loss with goal of 10% of body weigh over the next 6 months using a low glycemic index diet and regular exercise a minimum of 5 days per week.        Essential hypertension    Well controlled on current regimen. Renal function stable, no changes  today. Lab Results  Component Value Date   CREATININE 1.02 (H) 10/07/2017   Lab Results  Component Value Date   NA 134 (L) 10/07/2017   K 4.2 10/07/2017   CL 104 10/07/2017   CO2 22 10/07/2017         Relevant Medications   lisinopril-hydrochlorothiazide (PRINZIDE,ZESTORETIC) 10-12.5 MG tablet      I have changed Azhane E. Barish's lisinopril-hydrochlorothiazide. I am also having her maintain her aspirin, albuterol, cetirizine, fluticasone, montelukast, and spironolactone.  Meds ordered this encounter  Medications  . lisinopril-hydrochlorothiazide (PRINZIDE,ZESTORETIC) 10-12.5 MG tablet    Sig: Take 1 tablet by mouth daily.    Dispense:  90 tablet    Refill:  3      Medications Discontinued During This Encounter  Medication Reason  . lisinopril-hydrochlorothiazide (PRINZIDE,ZESTORETIC) 10-12.5 MG tablet Reorder    Follow-up: Return in about 6 months (around 04/13/2018) for follow up diabetes.   Teresa L Tullo, MD 

## 2017-10-12 NOTE — Progress Notes (Signed)
REFERRING PROVIDER: Crecencio Mc, MD [place],  [address]  PRIMARY PROVIDER:  Crecencio Mc, MD  PRIMARY REASON FOR VISIT:  1. Family history of breast cancer in female   2. Family history of colon cancer   3. Family history of brain cancer      HISTORY OF PRESENT ILLNESS:   Nichole Jensen, a 57 y.o. female, was seen for a [facility] cancer genetics consultation at the request of Dr. Derrel Nip due to a family history of cancer.  Nichole Jensen presents to clinic today to discuss the possibility of a hereditary predisposition to cancer, genetic testing, and to further clarify her future cancer risks, as well as potential cancer risks for family members.   Nichole Jensen is a 57 y.o. female with no personal history of cancer.  The patient reports having several needle aspirations, and in the early 2000's had a clip put in place.  She has a breast density of category C.  CANCER HISTORY:   No history exists.     HORMONAL RISK FACTORS:  Menarche was at age 72.  First live birth at age N/A.  OCP use for approximately 10 years.  Ovaries intact: no.  Hysterectomy: yes.  Menopausal status: postmenopausal.  HRT use: 0 years. Colonoscopy: yes; 2 polyps. Mammogram within the last year: yes. Number of breast biopsies: 1. Up to date with pelvic exams:  no. Any excessive radiation exposure in the past:  no  Past Medical History:  Diagnosis Date  . Allergy   . Family history of brain cancer   . Family history of breast cancer in female   . Family history of colon cancer   . Hyperlipidemia   . Hypertension     Past Surgical History:  Procedure Laterality Date  . ABDOMINAL HYSTERECTOMY  2005   endometriosis and adenomyosis, Dr. Rayford Halsted  . BREAST BIOPSY Right 2001   CORE W/CLIP - NEG  . BREAST CYST ASPIRATION Right 2001   NEG  . BREAST SURGERY  2000   cyst 2000; right; benign  . TONSILLECTOMY  1968    Social History   Socioeconomic History  . Marital status:  Divorced    Spouse name: Not on file  . Number of children: Not on file  . Years of education: Not on file  . Highest education level: Not on file  Occupational History  . Not on file  Social Needs  . Financial resource strain: Not on file  . Food insecurity:    Worry: Not on file    Inability: Not on file  . Transportation needs:    Medical: Not on file    Non-medical: Not on file  Tobacco Use  . Smoking status: Never Smoker  . Smokeless tobacco: Never Used  Substance and Sexual Activity  . Alcohol use: Yes    Comment: rare  . Drug use: No  . Sexual activity: Not on file  Lifestyle  . Physical activity:    Days per week: Not on file    Minutes per session: Not on file  . Stress: Not on file  Relationships  . Social connections:    Talks on phone: Not on file    Gets together: Not on file    Attends religious service: Not on file    Active member of club or organization: Not on file    Attends meetings of clubs or organizations: Not on file    Relationship status: Not on file  Other  Topics Concern  . Not on file  Social History Narrative  . Not on file     FAMILY HISTORY:  We obtained a detailed, 4-generation family history.  Significant diagnoses are listed below: Family History  Problem Relation Age of Onset  . Heart disease Father 4       massive MI  . Heart disease Paternal Uncle   . Heart disease Paternal Grandmother   . Heart disease Paternal Grandfather   . Diabetes Sister   . Hypertension Sister   . Diabetes Brother   . Hypertension Brother   . Cancer Brother 48       female breast cancer, spreading to lung, non-smoker  . COPD Maternal Uncle        d. 20  . Brain cancer Paternal Aunt   . Polycythemia Paternal Aunt   . Colon cancer Paternal Aunt   . Polycythemia Paternal Aunt   . Heart disease Paternal Aunt   . Polycythemia Paternal Aunt   . Stomach cancer Neg Hx   . Breast cancer Neg Hx     The patient does not have children.  She has one  sister and two brothers.  One brother was diagnosed with breast cancer at age 23.  Her father passed away at 42 from a heart attack and her mother is living.  The patient's mother had one brother who died from COPD.  The patients maternal grandparents died from non cancer related issues.  The maternal grandmother's sister had a daughter who had ocular cancer and who's son had colon cancer.  The patient's father had two brothers and three sisters.  One sister had a brain cancer and one had colon cancer.  The paternal grandparents died of non cancer related issues.  Nichole Jensen is unaware of previous family history of genetic testing for hereditary cancer risks. Patient's maternal ancestors are of [place] descent, and paternal ancestors are of [place] and [place] descent. There is no reported Ashkenazi Jewish ancestry. There is no known consanguinity.  GENETIC COUNSELING ASSESSMENT: Nichole Jensen is a 57 y.o. female with a family history of cancer which is somewhat suggestive of a hereditary cancer panel and predisposition to cancer. We, therefore, discussed and recommended the following at today's visit.   DISCUSSION: We discussed that about 2/3 of female breast cancer is thought to be hereditary, with most cases due to BRCA mutations.  Other genes that are associated with female breast cancer include PALB2 and CHEK2.  Based on her brother's 2/3 risk for having a hereditary syndrome, she has a 1/3 risk, or 33% chance of testing positive for a hereditary cancer syndrome.  We reviewed the characteristics, features and inheritance patterns of hereditary cancer syndromes. We also discussed genetic testing, including the appropriate family members to test, the process of testing, insurance coverage and turn-around-time for results. We discussed the implications of a negative, positive and/or variant of uncertain significant result. We recommended Nichole Jensen pursue genetic testing for the multi cancer gene panel. The  Multi-Gene Panel offered by [facility] includes sequencing and/or deletion duplication testing of the following 83 genes: ALK, APC, ATM, AXIN2,BAP1,  BARD1, BLM, BMPR1A, BRCA1, BRCA2, BRIP1, CASR, CDC73, CDH1, CDK4, CDKN1B, CDKN1C, CDKN2A (p14ARF), CDKN2A (p16INK4a), CEBPA, CHEK2, CTNNA1, DICER1, DIS3L2, EGFR (c.2369C>T, p.Thr790Met variant only), EPCAM (Deletion/duplication testing only), FH, FLCN, GATA2, GPC3, GREM1 (Promoter region deletion/duplication testing only), HOXB13 (c.251G>A, p.Gly84Glu), HRAS, KIT, MAX, MEN1, MET, MITF (c.952G>A, p.Glu318Lys variant only), MLH1, MSH2, MSH3, MSH6, MUTYH, NBN, NF1, NF2, NTHL1, PALB2,  PDGFRA, PHOX2B, PMS2, POLD1, POLE, POT1, PRKAR1A, PTCH1, PTEN, RAD50, RAD51C, RAD51D, RB1, RECQL4, RET, RUNX1, SDHAF2, SDHA (sequence changes only), SDHB, SDHC, SDHD, SMAD4, SMARCA4, SMARCB1, SMARCE1, STK11, SUFU, TERT, TERT, TMEM127, TP53, TSC1, TSC2, VHL, WRN and WT1.    Based on Nichole Jensen's family history of cancer, she meets medical criteria for genetic testing. Despite that she meets criteria, she may still have an out of pocket cost. We discussed that if her out of pocket cost for testing is over $100, the laboratory will call and confirm whether she wants to proceed with testing.  If the out of pocket cost of testing is less than $100 she will be billed by the genetic testing laboratory.   PLAN: After considering the risks, benefits, and limitations, Nichole Jensen  provided informed consent to pursue genetic testing and the blood sample was sent to [facility] for analysis of the Multi-cancer panel. Results should be available within approximately 2-3 weeks' time, at which point they will be disclosed by telephone to Nichole Jensen, as will any additional recommendations warranted by these results. Nichole Jensen will receive a summary of her genetic counseling visit and a copy of her results once available. This information will also be available in Epic. We encouraged Nichole Jensen to remain  in contact with cancer genetics annually so that we can continuously update the family history and inform her of any changes in cancer genetics and testing that may be of benefit for her family. Nichole Jensen were answered to her satisfaction today. Our contact information was provided should additional Jensen or concerns arise.  Lastly, we encouraged Nichole Jensen to remain in contact with cancer genetics annually so that we can continuously update the family history and inform her of any changes in cancer genetics and testing that may be of benefit for this family.   Ms.  Jensen Jensen were answered to her satisfaction today. Our contact information was provided should additional Jensen or concerns arise. Thank you for the referral and allowing [place] to share in the care of your patient.   Nichole Jensen P. Florene Glen, [place], [facility] Certified Genetic Counselor [email or web address] phone: [phone]  The patient was seen for a total of 35 minutes in face-to-face genetic counseling.  This patient was discussed with Drs. Magrinat, Lindi Adie and/or Burr Medico who agrees with the above.    _______________________________________________________________________ For Office Staff:  Number of people involved in session: 1 Was an Intern/ student involved with case: no

## 2017-10-15 NOTE — Assessment & Plan Note (Signed)
I have congratulated her in her lifestyle changes with adding exercise and following  A ow carb diet with the goal of weight loss with goal of 10% of body weigh over the next 6 months using a low glycemic index diet and regular exercise a minimum of 5 days per week.

## 2017-10-15 NOTE — Assessment & Plan Note (Signed)
Well controlled on current regimen. Renal function stable, no changes today. Lab Results  Component Value Date   CREATININE 1.02 (H) 10/07/2017   Lab Results  Component Value Date   NA 134 (L) 10/07/2017   K 4.2 10/07/2017   CL 104 10/07/2017   CO2 22 10/07/2017

## 2017-10-15 NOTE — Assessment & Plan Note (Signed)
Diet controlled.   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

## 2017-11-01 ENCOUNTER — Telehealth: Payer: Self-pay | Admitting: Genetic Counselor

## 2017-11-01 ENCOUNTER — Encounter: Payer: Self-pay | Admitting: Genetic Counselor

## 2017-11-01 ENCOUNTER — Ambulatory Visit: Payer: Self-pay | Admitting: Genetic Counselor

## 2017-11-01 DIAGNOSIS — Z1379 Encounter for other screening for genetic and chromosomal anomalies: Secondary | ICD-10-CM | POA: Insufficient documentation

## 2017-11-01 NOTE — Telephone Encounter (Signed)
Revealed positive RAD50 result. Discussed that it increases risk for breast cancer, but specific risks have not been estimated.  At this time we do not feel that there is a specific risk for other cancers.  Recommended screening includes earlier mammograms for younger women starting at age 57.  Based on family history, breast MRI may be warranted.  Free testing for relatives up to 90 days after your report date.  Gave phone number of [facility] in [place], [place] for her relatives.

## 2017-11-01 NOTE — Progress Notes (Signed)
GENETIC TEST RESULTS   Patient Name: Nichole Jensen Patient Age: 57 y.o. Encounter Date: 11/01/2017  Referring Provider: Stark Klein, MD  Deborra Medina, MD    [name] was seen in the [facility] clinic on October 12, 2017 due to a family history of cancer and concern regarding a hereditary predisposition to cancer in the family. Please refer to the prior Genetics clinic note for more information regarding [name]'s medical and family histories and our assessment at the time.   FAMILY HISTORY:  We obtained a detailed, 4-generation family history.  Significant diagnoses are listed below: Family History  Problem Relation Age of Onset  . Heart disease Father 56       massive MI  . Heart disease Paternal Uncle   . Heart disease Paternal Grandmother   . Heart disease Paternal Grandfather   . Diabetes Sister   . Hypertension Sister   . Diabetes Brother   . Hypertension Brother   . Cancer Brother 36       female breast cancer, spreading to lung, non-smoker  . COPD Maternal Uncle        d. 28  . Brain cancer Paternal Aunt   . Polycythemia Paternal Aunt   . Colon cancer Paternal Aunt   . Polycythemia Paternal Aunt   . Heart disease Paternal Aunt   . Polycythemia Paternal Aunt   . Stomach cancer Neg Hx   . Breast cancer Neg Hx     The patient does not have children.  She has one sister and two brothers.  One brother was diagnosed with breast cancer at age 58.  Her father passed away at 37 from a heart attack and her mother is living.  The patient's mother had one brother who died from COPD.  The patients maternal grandparents died from non cancer related issues.  The maternal grandmother's sister had a daughter who had ocular cancer and who's son had colon cancer.  The patient's father had two brothers and three sisters.  One sister had a brain cancer and one had colon cancer.  The paternal grandparents died of non cancer related issues.  Ms. Pickert is unaware of previous  family history of genetic testing for hereditary cancer risks. Patient's maternal ancestors are of [place] descent, and paternal ancestors are of [place] and [place] descent. There is no reported Ashkenazi Jewish ancestry. There is no known consanguinity.  GENETIC TESTING:  At the time of [name]'s visit, we recommended she pursue genetic testing of the Multi-cancer panel. 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 There were no deleterious mutations in ALK, APC, ATM, AXIN2,BAP1,  BARD1, BLM, BMPR1A, BRCA1, BRCA2, BRIP1, CASR, CDC73, CDH1, CDK4, CDKN1B, CDKN1C, CDKN2A (p14ARF), CDKN2A (p16INK4a), CEBPA, CHEK2, CTNNA1, DICER1, DIS3L2, EGFR (c.2369C>T, p.Thr790Met variant only), EPCAM (Deletion/duplication testing only), FH, FLCN, GATA2, GPC3, GREM1 (Promoter region deletion/duplication testing only), HOXB13 (c.251G>A, p.Gly84Glu), HRAS, KIT, MAX, MEN1, MET, MITF (c.952G>A, p.Glu318Lys variant only), MLH1, MSH2, MSH3, MSH6, MUTYH, NBN, NF1, NF2, NTHL1, PALB2, PDGFRA, PHOX2B, PMS2, POLD1, POLE, POT1, PRKAR1A, PTCH1, PTEN, RAD51C, RAD51D, RB1, RECQL4, RET, RUNX1, SDHAF2, SDHA (sequence changes only), SDHB, SDHC, SDHD, SMAD4, SMARCA4, SMARCB1, SMARCE1, STK11, SUFU, TERT, TERT, TMEM127, TP53, TSC1, TSC2, VHL, WRN and WT1.   Genetic testing did detect a Variant of Unknown Significance in the ALK gene called c.2623G>A. At this time, it is unknown if this variant is associated  with increased cancer risk or if this is a normal finding, but most variants such as this get reclassified to being inconsequential. It should not be used to make medical management decisions. With time, we suspect the lab will determine the significance of this variant, if any. If we do learn more about it, we will try to contact Ms. Salone to discuss it further. However, it is important to stay in  touch with [place] periodically and keep the address and phone number up to date.    RAD50 GENE The RAD50 gene is a component of the MRN complex, which is a protein complex consisting of the MRE11A, RAD50, and NBN genes. This complex plays a central role in double-strand break repair, DNA recombination, and maintenance of telomere integrity and meiosis (PMID: 47654650; UniProtKB - P54656 RAD50_HUMAN; Accessed July 2016. If there is a pathogenic variant in this gene that prevents it from functioning normally, the risk of developing certain types of cancers is increased.  CLINICAL CONDITION Women who are carriers of a single pathogenic RAD50 variant have an increased risk of breast cancer, although exact risk figures have yet to be determined (PMID: 81275170, 01749449, 67591638). Data also associate the RAD50 gene with ovarian cancer; however, these data are limited (PMID: 46659935). An elevated risk for other cancers has been considered, but available evidence is insufficient to make a determination at this time (PMID: 70177939, 03009233, 00762263). While an individual with a RAD50 pathogenic variant will not necessarily develop cancer in her lifetime, her cancer risk is increased when compared to the general population risk.  INHERITANCE Individuals with a single pathogenic variant in RAD50 have a 50% chance of passing that variant on to their offspring. It is now possible to identify relatives who can pursue testing for this specific familial variant. Many cases are inherited from a parent, but some cases occur spontaneously (i.e., an individual with a pathogenic variant has parents who do not have it).  Limited evidence also supports a correlation between the RAD50 gene and autosomal recessive Nijmegen breakage syndrome-like disorder (NBSLD). NBSLD is characterized by a clinical phenotype resembling Nijmegen breakage syndrome including chromosomal instability, radiosensitivity, neurodevelopmental disease,  and immunodeficiency, but with a milder clinical presentation (PMID: 33545625).  MANAGEMENT At this time, there are no published guidelines or recommendations suggesting RAD50-specific medical management. However, due to the associated increased risk of breast cancer in women with a pathogenic RAD50 variant, enhanced or more frequent cancer screening may be warranted. An individual's cancer risk and medical management are not determined by genetic test results alone. Overall cancer risk assessment incorporates additional factors, including personal medical history, family history, and any available genetic information that may result in a personalized plan for cancer prevention and surveillance.  Even though data regarding pathogenic RAD50 are limited and the associated risk of breast and ovarian cancer is currently emerging, knowing if a pathogenic variant is present is advantageous. At-risk relatives can be identified, enabling pursuit of a diagnostic evaluation. Further, the available information regarding hereditary cancer susceptibility genes is constantly evolving and more clinically relevant data regarding RAD50 are likely to become available in the near future. Awareness of this cancer predisposition encourages patients and their providers to inform at-risk family members, diligently follow standard screening protocols, and be vigilant in maintaining close and regular contact with their local genetics clinic in anticipation of new information.  Ms. Alcaraz has been referred to the High Risk clinic at [facility].  She will be called by our office  to set up this appointment.  FAMILY MEMBERS:  It is important that all of Ms. Lococo's relatives (both men and women) know of the presence of this gene mutation. Site-specific genetic testing can sort out who in the family is at risk and who is not.   Ms. Hoge siblings have a 50% chance to have inherited this mutation. We recommend they  have genetic testing for this same mutation, as identifying the presence of this mutation would allow them to also take advantage of risk-reducing measures. Genetic testing can be provided for free through [facility] for up to 90 days after the report date of October 27, 2017.  There may be a co pay for the appointment, but the actual test is complementary for 90 days.  SUPPORT AND RESOURCES:  If Ms. Stegeman is interested in BRCA-specific information and support, there are two groups, Facing Our Risk (www.facingourrisk.com) and Bright Pink (www.brightpink.org) which some people have found useful. They provide opportunities to speak with other individuals from high-risk families. To locate genetic counselors in other cities, visit the website of the [employer] (ArtistMovie.se) and Secretary/administrator for a Social worker by zip code.  We encouraged Ms. Wuertz to remain in contact with [place] on an annual basis so we can update her personal and family histories, and let her know of advances in cancer genetics that may benefit the family. Our contact number was provided. Ms. Hunton questions were answered to her satisfaction today, and she knows she is welcome to call anytime with additional questions.   Alesia Oshields P. Florene Glen, [place], [facility] Certified Genetic Counselor [email or web address] phone: [phone]

## 2017-11-01 NOTE — Telephone Encounter (Signed)
LM on VM that results are back and to please CB for them.

## 2017-11-01 NOTE — Telephone Encounter (Signed)
error 

## 2017-11-11 ENCOUNTER — Encounter: Payer: Self-pay | Admitting: Hematology

## 2017-11-11 ENCOUNTER — Telehealth: Payer: Self-pay | Admitting: Nurse Practitioner

## 2017-11-11 NOTE — Telephone Encounter (Signed)
Pt has been scheduled to see Dr. Burr Medico for the high risk breast clinic on 8/8 at 230pm. Pt has agreed to the appt date and time. Letter mailed.

## 2017-11-22 ENCOUNTER — Other Ambulatory Visit: Payer: Self-pay

## 2017-11-22 ENCOUNTER — Emergency Department
Admission: EM | Admit: 2017-11-22 | Discharge: 2017-11-22 | Disposition: A | Discharge status: Home / Self Care | Payer: BC Managed Care – PPO | Attending: Emergency Medicine | Admitting: Emergency Medicine

## 2017-11-22 ENCOUNTER — Emergency Department: Payer: BC Managed Care – PPO

## 2017-11-22 DIAGNOSIS — I10 Essential (primary) hypertension: Secondary | ICD-10-CM | POA: Insufficient documentation

## 2017-11-22 DIAGNOSIS — Z7982 Long term (current) use of aspirin: Secondary | ICD-10-CM | POA: Insufficient documentation

## 2017-11-22 DIAGNOSIS — Z79899 Other long term (current) drug therapy: Secondary | ICD-10-CM | POA: Diagnosis not present

## 2017-11-22 DIAGNOSIS — K5792 Diverticulitis of intestine, part unspecified, without perforation or abscess without bleeding: Secondary | ICD-10-CM

## 2017-11-22 DIAGNOSIS — K5732 Diverticulitis of large intestine without perforation or abscess without bleeding: Secondary | ICD-10-CM | POA: Insufficient documentation

## 2017-11-22 DIAGNOSIS — E119 Type 2 diabetes mellitus without complications: Secondary | ICD-10-CM | POA: Insufficient documentation

## 2017-11-22 DIAGNOSIS — R1032 Left lower quadrant pain: Secondary | ICD-10-CM | POA: Diagnosis present

## 2017-11-22 LAB — COMPREHENSIVE METABOLIC PANEL
ALT: 15 U/L (ref 0–44)
ANION GAP: 10 (ref 5–15)
AST: 15 U/L (ref 15–41)
Albumin: 4.2 g/dL (ref 3.5–5.0)
Alkaline Phosphatase: 77 U/L (ref 38–126)
BUN: 14 mg/dL (ref 6–20)
CO2: 25 mmol/L (ref 22–32)
Calcium: 9.2 mg/dL (ref 8.9–10.3)
Chloride: 104 mmol/L (ref 98–111)
Creatinine, Ser: 0.99 mg/dL (ref 0.44–1.00)
GFR calc non Af Amer: >60 mL/min (ref >60)
Glucose, Bld: 128 mg/dL — ABNORMAL HIGH (ref 70–99)
POTASSIUM: 3.9 mmol/L (ref 3.5–5.1)
SODIUM: 139 mmol/L (ref 135–145)
Total Bilirubin: 0.6 mg/dL (ref 0.3–1.2)
Total Protein: 7.3 g/dL (ref 6.5–8.1)

## 2017-11-22 LAB — CBC
HEMATOCRIT: 38.8 % (ref 35.0–47.0)
HEMOGLOBIN: 13.3 g/dL (ref 12.0–16.0)
MCH: 29.1 pg (ref 26.0–34.0)
MCHC: 34.4 g/dL (ref 32.0–36.0)
MCV: 84.6 fL (ref 80.0–100.0)
Platelets: 197 10*3/uL (ref 150–440)
RBC: 4.59 MIL/uL (ref 3.80–5.20)
RDW: 13.2 % (ref 11.5–14.5)
WBC: 16.2 10*3/uL — ABNORMAL HIGH (ref 3.6–11.0)

## 2017-11-22 LAB — LIPASE, BLOOD: Lipase: 22 U/L (ref 11–51)

## 2017-11-22 MED ORDER — FENTANYL CITRATE (PF) 100 MCG/2ML IJ SOLN
50.0000 ug | Freq: Once | INTRAMUSCULAR | Status: AC
  Administered 2017-11-22: 50 ug via INTRAVENOUS
  Filled 2017-11-22: qty 2

## 2017-11-22 MED ORDER — TRAMADOL HCL 50 MG PO TABS: 50.0000 mg | ORAL_TABLET | Freq: Four times a day (QID) | ORAL | 0 refills | Status: DC | PRN

## 2017-11-22 MED ORDER — IOHEXOL 300 MG/ML  SOLN
100.0000 mL | Freq: Once | INTRAMUSCULAR | Status: AC | PRN
  Administered 2017-11-22: 100 mL via INTRAVENOUS
  Filled 2017-11-22: qty 100

## 2017-11-22 MED ORDER — AMOXICILLIN-POT CLAVULANATE 875-125 MG PO TABS: 1.0000 | ORAL_TABLET | Freq: Two times a day (BID) | ORAL | 0 refills | Status: AC

## 2017-11-22 MED ORDER — AMOXICILLIN-POT CLAVULANATE 875-125 MG PO TABS: 1.0000 | ORAL_TABLET | Freq: Two times a day (BID) | ORAL | 0 refills | Status: DC

## 2017-11-22 MED ORDER — SODIUM CHLORIDE 0.9 % IV BOLUS
1000.0000 mL | Freq: Once | INTRAVENOUS | Status: AC
  Administered 2017-11-22: 1000 mL via INTRAVENOUS

## 2017-11-22 NOTE — ED Provider Notes (Signed)
[facility] Emergency Department Provider Note   ____________________________________________   I have reviewed the triage vital signs and the nursing notes.   HISTORY  Chief Complaint Abdominal Pain   History limited by: Not Limited   HPI Nichole Jensen is a 57 y.o. female who presents to the emergency department today from walk-in clinic because of concerns for possible diverticulitis.  Patient went to walk-in clinic today because of concerns for abdominal pain.  Is located left lower quadrant.  Started a few days ago.  She states that her symptoms started shortly after she started eating a lot of salads and greens.  She noticed some abdominal bloating.  She has had decreased appetite associated with this.  She has not had any fevers but felt some chills.  States that she had a colonoscopy which showed some diverticulum however is never had diverticulitis.   Per medical record review patient has a history of HLD, HTN, abd hysterectomy.  Past Medical History:  Diagnosis Date  . Allergy   . Family history of brain cancer   . Family history of breast cancer in female   . Family history of colon cancer   . Hyperlipidemia   . Hypertension     Patient Active Problem List   Diagnosis Date Noted  . Genetic testing 11/01/2017  . Family history of breast cancer in female   . Family history of colon cancer   . Family history of brain cancer   . Breast tenderness 08/11/2017  . Acute recurrent sinusitis 11/03/2016  . Type II diabetes mellitus with nephropathy ([place]) 05/22/2016  . Family history of lung cancer 10/18/2014  . Routine general medical examination at a health care facility 01/01/2014  . Obesity (BMI 30-39.9) 01/01/2014  . Essential hypertension 04/22/2011  . Seasonal allergies 04/22/2011    Past Surgical History:  Procedure Laterality Date  . ABDOMINAL HYSTERECTOMY  2005   endometriosis and adenomyosis, Dr. Rayford Halsted  . BREAST BIOPSY Right 2001    CORE W/CLIP - NEG  . BREAST CYST ASPIRATION Right 2001   NEG  . BREAST SURGERY  2000   cyst 2000; right; benign  . TONSILLECTOMY  1968    Prior to Admission medications   Medication Sig Start Date End Date Taking? Authorizing Provider  albuterol (PROVENTIL HFA;VENTOLIN HFA) 108 (90 Base) MCG/ACT inhaler Inhale 2 puffs into the lungs every 6 (six) hours as needed for wheezing or shortness of breath. 08/23/16   Crecencio Mc, MD  aspirin 81 MG tablet Take 81 mg by mouth daily.      [provider]  cetirizine (ZYRTEC) 10 MG tablet Take 10 mg daily by mouth.    [provider]  fluticasone (FLONASE ALLERGY RELIEF) 50 MCG/ACT nasal spray Place 2 sprays daily into both nostrils. 03/07/17   Crecencio Mc, MD  lisinopril-hydrochlorothiazide (PRINZIDE,ZESTORETIC) 10-12.5 MG tablet Take 1 tablet by mouth daily. 10/12/17   Crecencio Mc, MD  montelukast (SINGULAIR) 10 MG tablet TAKE 1 TABLET BY MOUTH AT BEDTIME 03/23/17   Crecencio Mc, MD  spironolactone (ALDACTONE) 25 MG tablet TAKE 1 TABLET BY MOUTH EVERY DAY Patient not taking: Reported on 10/12/2017 09/01/17   Crecencio Mc, MD    Allergies Macrodantin  Family History  Problem Relation Age of Onset  . Heart disease Father 46       massive MI  . Heart disease Paternal Uncle   . Heart disease Paternal Grandmother   . Heart disease Paternal Grandfather   .  Diabetes Sister   . Hypertension Sister   . Diabetes Brother   . Hypertension Brother   . Cancer Brother 52       female breast cancer, spreading to lung, non-smoker  . COPD Maternal Uncle        d. 4  . Brain cancer Paternal Aunt   . Polycythemia Paternal Aunt   . Colon cancer Paternal Aunt   . Polycythemia Paternal Aunt   . Heart disease Paternal Aunt   . Polycythemia Paternal Aunt   . Stomach cancer Neg Hx   . Breast cancer Neg Hx     Social History Social History   Tobacco Use  . Smoking status: Never Smoker  . Smokeless tobacco: Never Used   Substance Use Topics  . Alcohol use: Yes    Comment: rare  . Drug use: No    Review of Systems Constitutional: No fever/chills Eyes: No visual changes. ENT: No sore throat. Cardiovascular: Denies chest pain. Respiratory: Denies shortness of breath. Gastrointestinal: Positive for left lower quadrant pain. Genitourinary: Negative for dysuria. Musculoskeletal: Negative for back pain. Skin: Negative for rash. Neurological: Negative for headaches, focal weakness or numbness.  ____________________________________________   PHYSICAL EXAM:  VITAL SIGNS: ED Triage Vitals  Enc Vitals Group     BP 11/22/17 1001 124/74     Pulse Rate 11/22/17 1001 82     Resp 11/22/17 1001 18     Temp 11/22/17 1001 98.5 F (36.9 C)     Temp Source 11/22/17 1001 Oral     SpO2 11/22/17 1001 97 %     Weight 11/22/17 1000 197 lb (89.4 kg)     Height 11/22/17 1000 5\' 3"  (1.6 m)     Head Circumference --      Peak Flow --      Pain Score 11/22/17 1001 6   Constitutional: Alert and oriented.  Eyes: Conjunctivae are normal.  ENT      Head: Normocephalic and atraumatic.      Nose: No congestion/rhinnorhea.      Mouth/Throat: Mucous membranes are moist.      Neck: No stridor. Hematological/Lymphatic/Immunilogical: No cervical lymphadenopathy. Cardiovascular: Normal rate, regular rhythm.  No murmurs, rubs, or gallops.  Respiratory: Normal respiratory effort without tachypnea nor retractions. Breath sounds are clear and equal bilaterally. No wheezes/rales/rhonchi. Gastrointestinal: Soft and tender to palpation in the left lower quadrant. Genitourinary: Deferred Musculoskeletal: Normal range of motion in all extremities. No lower extremity edema. Neurologic:  Normal speech and language. No gross focal neurologic deficits are appreciated.  Skin:  Skin is warm, dry and intact. No rash noted. Psychiatric: Mood and affect are normal. Speech and behavior are normal. Patient exhibits appropriate insight and  judgment.  ____________________________________________    LABS (pertinent positives/negatives)  Lipase 22 CMP wnl except glu 128 CBC wbc 16.2, hgb 13.3, plt 197  ____________________________________________   EKG  None  ____________________________________________    RADIOLOGY  CT abd/pel Diverticulitis  ________________________________   PROCEDURES  Procedures  ____________________________________________   INITIAL IMPRESSION / ASSESSMENT AND PLAN / ED COURSE  Pertinent labs & imaging results that were available during my care of the patient were reviewed by me and considered in my medical decision making (see chart for details).   Patient presented to the emergency department today because of concerns for left lower quadrant abdominal pain.  CT scan is consistent with diverticulitis without comp gating features.  Discussed this finding with the patient.  Discussed plan of care.  Will discharge  with antibiotics pain medication.  Discussed return precautions.   ____________________________________________   FINAL CLINICAL IMPRESSION(S) / ED DIAGNOSES  Final diagnoses:  Diverticulitis     Note: This dictation was prepared with Dragon dictation. Any transcriptional errors that result from this process are unintentional     Nance Pear, MD 11/22/17 [phone]

## 2017-11-22 NOTE — Discharge Instructions (Signed)
Please seek medical attention for any high fevers, chest pain, shortness of breath, change in behavior, persistent vomiting, bloody stool or any other new or concerning symptoms.  

## 2017-11-22 NOTE — ED Triage Notes (Signed)
Pt sent from [facility] with c/o LLQ pain with a Hx of diverticulosis. Pt c/o pain since Thursday and diarrhea for the past 2 days. Denies vomiting. States she is having a lot of abd bloating/distention.

## 2017-11-23 ENCOUNTER — Encounter: Payer: Self-pay | Admitting: Internal Medicine

## 2017-11-28 NOTE — Progress Notes (Signed)
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 High risk for breast cancer  HISTORY OF PRESENTING ILLNESS:  Nichole Jensen 57 y.o. 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 She was initially referred to genetic counseling by Dr. Thurnell Garbe  due to family history of cancer and breast tenderness. The patient has no personal history of cancer. Genetic testing revealed RAD50 mutation that increases risk of early onset breast cancer. She is here today alone. She states that she had breast pain that she discussed with Dr. Barry Dienes, who recommended genetic testing.   She reports having a breast mass that was benign for which she had a clip placed and was monitored with breast US and mammograms for a year on 2001.   Menarche was between 68-10 yo. She had difficult menses and was seeing a Ob/Gyne at age 72. She never conceived due to endometriosis and adenomyosis. She has BOS and hysterectomy at age 70, after which she became menopausal.   The patient does not have children. She has one sister and two brothers. One brother was diagnosed with anterior chest wall cancer at age 1. He was a nonsmoker. Her father passed away at 68 from a heart attack and her mother is living. The maternal grandmother's sister had a daughter who had ocular cancer and who's son had colon cancer. The patient's father had two brothers and three sisters. One sister had a brain cancer and one had colon cancer. The paternal grandparents died of non cancer related issues.  MEDICAL HISTORY:  Past Medical History:  Diagnosis Date  . Allergy   . Family history of brain cancer   . Family history of breast cancer in female     . Family history of colon cancer   . Hyperlipidemia   . Hypertension     SURGICAL HISTORY: Past Surgical History:  Procedure Laterality Date  . ABDOMINAL HYSTERECTOMY  2005   endometriosis and adenomyosis, Dr. Rayford Halsted  . BREAST BIOPSY Right 2001   CORE W/CLIP - NEG  . BREAST CYST ASPIRATION Right 2001   NEG  . BREAST SURGERY  2000   cyst 2000; right; benign  . TONSILLECTOMY  1968    SOCIAL HISTORY: Social History   Socioeconomic History  . Marital status: Divorced    Spouse name: Not on file  . Number of children: Not on file  . Years of education: Not on file  . Highest education level: Not on file  Occupational History  . Not on file  Social Needs  . Financial resource strain: Not on file  . Food insecurity:    Worry: Not on file    Inability: Not on file  . Transportation needs:    Medical: Not on file    Non-medical: Not on file  Tobacco Use  . Smoking status: Never Smoker  . Smokeless tobacco: Never Used  Substance and Sexual Activity  . Alcohol use: Yes    Comment: rare  . Drug use: No  . Sexual activity: Not on file  Lifestyle  . Physical activity:    Days per week: Not on file    Minutes per session: Not on file  . Stress: Not on file  Relationships  . Social connections:  Talks on phone: Not on file    Gets together: Not on file    Attends religious service: Not on file    Active member of club or organization: Not on file    Attends meetings of clubs or organizations: Not on file    Relationship status: Not on file  . Intimate partner violence:    Fear of current or ex partner: Not on file    Emotionally abused: Not on file    Physically abused: Not on file    Forced sexual activity: Not on file  Other Topics Concern  . Not on file  Social History Narrative  . Not on file    FAMILY HISTORY: Family History  Problem Relation Age of Onset  . Heart disease Father 39       massive MI  . Heart disease Paternal Uncle   . Heart disease  Paternal Grandmother   . Heart disease Paternal Grandfather   . Diabetes Sister   . Hypertension Sister   . Diabetes Brother   . Hypertension Brother   . Cancer Brother 31       female breast cancer, spreading to lung, non-smoker  . COPD Maternal Uncle        d. 50  . Brain cancer Paternal Aunt   . Polycythemia Paternal Aunt   . Colon cancer Paternal Aunt   . Polycythemia Paternal Aunt   . Heart disease Paternal Aunt   . Polycythemia Paternal Aunt   . Stomach cancer Neg Hx   . Breast cancer Neg Hx     ALLERGIES:  is allergic to macrodantin.  MEDICATIONS:  Current Outpatient Medications  Medication Sig Dispense Refill  . albuterol (PROVENTIL HFA;VENTOLIN HFA) 108 (90 Base) MCG/ACT inhaler Inhale 2 puffs into the lungs every 6 (six) hours as needed for wheezing or shortness of breath. 1 Inhaler 3  . amoxicillin-clavulanate (AUGMENTIN) 875-125 MG tablet Take 1 tablet by mouth 2 (two) times daily for 10 days. 20 tablet 0  . aspirin 81 MG tablet Take 81 mg by mouth daily.      . cetirizine (ZYRTEC) 10 MG tablet Take 10 mg daily by mouth.    . fluticasone (FLONASE ALLERGY RELIEF) 50 MCG/ACT nasal spray Place 2 sprays daily into both nostrils. 16 g 2  . lisinopril-hydrochlorothiazide (PRINZIDE,ZESTORETIC) 10-12.5 MG tablet Take 1 tablet by mouth daily. 90 tablet 3  . montelukast (SINGULAIR) 10 MG tablet TAKE 1 TABLET BY MOUTH AT BEDTIME 90 tablet 1   No current facility-administered medications for this visit.     REVIEW OF SYSTEMS:   Constitutional: Denies fevers, chills or abnormal night sweats Eyes: Denies blurriness of vision, double vision or watery eyes Ears, nose, mouth, throat, and face: Denies mucositis or sore throat Respiratory: Denies cough, dyspnea or wheezes Cardiovascular: Denies palpitation, chest discomfort or lower extremity swelling Gastrointestinal:  Denies nausea, heartburn or change in bowel habits Skin: Denies abnormal skin rashes Breast: (+) nonspecific  breast pain Lymphatics: Denies new lymphadenopathy or easy bruising Neurological:Denies numbness, tingling or new weaknesses Behavioral/Psych: Mood is stable, no new changes  All other systems were reviewed with the patient and are negative.  PHYSICAL EXAMINATION: ECOG PERFORMANCE STATUS: 0 - Asymptomatic  Vitals:   12/01/17 1431  BP: 134/68  Pulse: 78  Resp: 18  Temp: 98.2 F (36.8 C)  SpO2: 97%   Filed Weights   12/01/17 1431  Weight: 198 lb 4.8 oz (89.9 kg)    GENERAL:alert, no distress and comfortable SKIN:  skin color, texture, turgor are normal, no rashes or significant lesions EYES: normal, conjunctiva are pink and non-injected, sclera clear OROPHARYNX:no exudate, no erythema and lips, buccal mucosa, and tongue normal  NECK: supple, thyroid normal size, non-tender, without nodularity LYMPH:  no palpable lymphadenopathy in the cervical, axillary or inguinal LUNGS: clear to auscultation and percussion with normal breathing effort HEART: regular rate & rhythm and no murmurs and no lower extremity edema ABDOMEN:abdomen soft, non-tender and normal bowel sounds Musculoskeletal:no cyanosis of digits and no clubbing  PSYCH: alert & oriented x 3 with fluent speech NEURO: no focal motor/sensory deficits  LABORATORY DATA:  I have reviewed the data as listed CBC Latest Ref Rng & Units 11/22/2017 08/17/2016 02/12/2016  WBC 3.6 - 11.0 K/uL 16.2(H) 7.7 6.9  Hemoglobin 12.0 - 16.0 g/dL 13.3 13.7 13.6  Hematocrit 35.0 - 47.0 % 38.8 40.9 40.6  Platelets 150 - 440 K/uL 197 216.0 224.0   CMP Latest Ref Rng & Units 11/22/2017 10/07/2017 03/01/2017  Glucose 70 - 99 mg/dL 128(H) 121(H) 121(H)  BUN 6 - 20 mg/dL 14 35(H) 21(H)  Creatinine 0.44 - 1.00 mg/dL 0.99 1.02(H) 0.83  Sodium 135 - 145 mmol/L 139 134(L) 136  Potassium 3.5 - 5.1 mmol/L 3.9 4.2 4.3  Chloride 98 - 111 mmol/L 104 104 105  CO2 22 - 32 mmol/L '25 22 23  ' Calcium 8.9 - 10.3 mg/dL 9.2 9.0 9.2  Total Protein 6.5 - 8.1 g/dL  7.3 7.3 7.1  Total Bilirubin 0.3 - 1.2 mg/dL 0.6 0.5 0.5  Alkaline Phos 38 - 126 U/L 77 85 97  AST 15 - 41 U/L '15 18 23  ' ALT 0 - 44 U/L '15 18 20   ' Pathology 10/12/2017 Genetics    RADIOGRAPHIC STUDIES:  09/28/2017 Mammogram IMPRESSION: No evidence of malignancy.  I have personally reviewed the radiological images as listed and agreed with the findings in the report. Ct Abdomen Pelvis W Contrast  Result Date: 11/22/2017 CLINICAL DATA:  Acute left lower quadrant abdominal pain. EXAM: CT ABDOMEN AND PELVIS WITH CONTRAST TECHNIQUE: Multidetector CT imaging of the abdomen and pelvis was performed using the standard protocol following bolus administration of intravenous contrast. CONTRAST:  136m OMNIPAQUE IOHEXOL 300 MG/ML  SOLN COMPARISON:  None. FINDINGS: Lower chest: No acute abnormality. Hepatobiliary: No focal liver abnormality is seen. No gallstones, gallbladder wall thickening, or biliary dilatation. Pancreas: Unremarkable. No pancreatic ductal dilatation or surrounding inflammatory changes. Spleen: Normal in size without focal abnormality. Adrenals/Urinary Tract: Adrenal glands appear normal. Left parapelvic cysts are noted. No hydronephrosis or renal obstruction is noted. No renal or ureteral calculi are noted. Urinary bladder is unremarkable. Stomach/Bowel: The stomach and appendix appear normal. Focal sigmoid diverticulitis is noted. No definite abscess is noted. No evidence of bowel obstruction is noted. Vascular/Lymphatic: No significant vascular findings are present. No enlarged abdominal or pelvic lymph nodes. Reproductive: Status post hysterectomy. No adnexal masses. Other: No abdominal wall hernia or abnormality. No abdominopelvic ascites. Musculoskeletal: No acute or significant osseous findings. IMPRESSION: Focal sigmoid diverticulitis.  No definite abscess is noted. Electronically Signed   By: JMarijo Conception M.D.   On: 11/22/2017 15:24    ASSESSMENT & PLAN:  CLIANI CARISis  a 57y.o. female with history of diverticulitis, HLD, HTN, and hysterectomy.  1. Pathogenic RAD50 mutation heterozygous. - She was initially referred to genetic counseling by Dr. BBarry Dienesdue to family history of cancer and breast tenderness. She has no personal history of cancer.  -Her genetic testing  was positive for RAD50, which has been reported to increaser susceptibility to breast cancer. However the exact risk of breast cancer is unknown.  Relevant literature is very limited. The RAD50 gene mutation was studied in a case and control study, and the study showed the MRE11A-RAD50-Nibrin (MRN) genes are indeed intermediate-risk breast cancer susceptibility genes (odds ratio (OR)=2.88, P=0.0090) (Breast Cancer Res. 2014 Jun 3;16(3):R58).  -Per NCCN guideline, RAD50 pathogenic mutation increases risk of breast cancer, however there is insufficient evidence to recommend prophylactic surgery or screening MRI. -We also discussed nongenetic risk factors for breast cancer, such as obesity, family history of breast cancer, early menarche, previous breast biopsy etc. She had multiple breast biopsy which were benign, but she does not know if she had atypical hyperplasia.  I used the [employer], and calculated her time risk of breast cancer is about 14%, slightly above average population (10%). No chemoprevention with antiestrogen agent is recommended.  -However due to her high breast density, multiple previous biopsy, I think is reasonable to consider screening breast MRI, especially the fast MRI which cost less.  She is interested. -We discussed the lifestyle change to decrease her risk of breast cancer, such as maintain normal weight, eat healthy, exercise, being positive, reducing stress etc. she is interested, and has been trying to loose weight.  -We discussed breast cancer surveillance with annual screening mammogram, plus minus breast MRI, health exam, and breast exam by a physician twice a year.  She is  interested in following [place]. -Back in 1 year for breast exam and order breast MRI and mammogram.    2. Genetics -At the time of Ms. Billick's visit, genetic counselor Florene Glen recommended she pursue genetic testing of the Multi-cancer panel. The genetic testing reported on October 27, 2017 through the Multi-Cancer Panel offered by [facility] identified a single, heterozygous pathogenic gene mutation called RAD50 c.2165del (T.[id number]). There were no deleterious mutations in ALK, APC, ATM, AXIN2,BAP1,  BARD1, BLM, BMPR1A, BRCA1, BRCA2, BRIP1, CASR, CDC73, CDH1, CDK4, CDKN1B, CDKN1C, CDKN2A (p14ARF), CDKN2A (p16INK4a), CEBPA, CHEK2, CTNNA1, DICER1, DIS3L2, EGFR (c.2369C>T, p.Thr790Met variant only), EPCAM (Deletion/duplication testing only), FH, FLCN, GATA2, GPC3, GREM1 (Promoter region deletion/duplication testing only), HOXB13 (c.251G>A, p.Gly84Glu), HRAS, KIT, MAX, MEN1, MET, MITF (c.952G>A, p.Glu318Lys variant only), MLH1, MSH2, MSH3, MSH6, MUTYH, NBN, NF1, NF2, NTHL1, PALB2, PDGFRA, PHOX2B, PMS2, POLD1, POLE, POT1, PRKAR1A, PTCH1, PTEN, RAD51C, RAD51D, RB1, RECQL4, RET, RUNX1, SDHAF2, SDHA (sequence changes only), SDHB, SDHC, SDHD, SMAD4, SMARCA4, SMARCB1, SMARCE1, STK11, SUFU, TERT, TERT, TMEM127, TP53, TSC1, TSC2, VHL, WRN and WT1.   Plan: -We discussed her lifestyle change, and breast cancer screening with mammogram and MRI. -she is due for mammogram in 09/2018  -f/u in 1 year, will order fast breast MRI for her on next visit    No orders of the defined types were placed in this encounter.   All questions were answered. The patient knows to call the clinic with any problems, questions or concerns. I spent 30 minutes counseling the patient face to face. The total time spent in the appointment was 40 minutes and more than 50% was on counseling.     Truitt Merle, MD 12/01/2017 9:08 PM

## 2017-12-01 ENCOUNTER — Encounter: Payer: Self-pay | Admitting: Internal Medicine

## 2017-12-01 ENCOUNTER — Inpatient Hospital Stay: Payer: BC Managed Care – PPO | Attending: Genetic Counselor | Admitting: Hematology

## 2017-12-01 ENCOUNTER — Telehealth: Payer: Self-pay | Admitting: Hematology

## 2017-12-01 ENCOUNTER — Ambulatory Visit: Payer: BC Managed Care – PPO | Admitting: Internal Medicine

## 2017-12-01 ENCOUNTER — Encounter: Payer: Self-pay | Admitting: Hematology

## 2017-12-01 VITALS — BP 102/66 | HR 72 | Temp 98.1°F | Resp 15 | Ht 63.0 in | Wt 197.8 lb

## 2017-12-01 VITALS — BP 134/68 | HR 78 | Temp 98.2°F | Resp 18 | Ht 63.0 in | Wt 198.3 lb

## 2017-12-01 DIAGNOSIS — K5732 Diverticulitis of large intestine without perforation or abscess without bleeding: Secondary | ICD-10-CM

## 2017-12-01 DIAGNOSIS — I10 Essential (primary) hypertension: Secondary | ICD-10-CM | POA: Insufficient documentation

## 2017-12-01 DIAGNOSIS — Z1501 Genetic susceptibility to malignant neoplasm of breast: Secondary | ICD-10-CM | POA: Insufficient documentation

## 2017-12-01 DIAGNOSIS — K5720 Diverticulitis of large intestine with perforation and abscess without bleeding: Secondary | ICD-10-CM | POA: Diagnosis not present

## 2017-12-01 DIAGNOSIS — Z803 Family history of malignant neoplasm of breast: Secondary | ICD-10-CM | POA: Insufficient documentation

## 2017-12-01 DIAGNOSIS — E785 Hyperlipidemia, unspecified: Secondary | ICD-10-CM | POA: Diagnosis not present

## 2017-12-01 DIAGNOSIS — Z90710 Acquired absence of both cervix and uterus: Secondary | ICD-10-CM | POA: Diagnosis not present

## 2017-12-01 DIAGNOSIS — Z1379 Encounter for other screening for genetic and chromosomal anomalies: Secondary | ICD-10-CM

## 2017-12-01 NOTE — Telephone Encounter (Signed)
Scheduled appt per 8/8 los- pt aware - per patient - no print out wanted.

## 2017-12-01 NOTE — Progress Notes (Signed)
Subjective:  Patient ID: Nichole Jensen, female    DOB: 25-Oct-1960  Age: 57 y.o. MRN: [id number]  CC: The primary encounter diagnosis was Diverticulitis of large intestine with perforation without abscess or bleeding. A diagnosis of Diverticulitis large intestine w/o perforation or abscess w/o bleeding was also pertinent to this visit.  HPI Nichole Jensen presents for ER follow up  Treated ON July 30  BY [facility]  ER  For diverticulitis after presenting to [facility] Urgent care with 2 day history of diarrhea , abdominal bloating and sudden onset of acute LLQ pain.   Leukocytosis noted,  cmet and lipase normal .  Focal sigmoid diverticulitis without abscess or obstruction noted on CT.  Given fluids, prescribed augmentin. Last dose of abx is today .  feels 95% better,  Still fatigued.     Has not had a history of chronic constipation.  Has been using one stool softener and miralax  Daily  Since ER visit .  Stools occurring daily,  Soft .  3 stools  In the morning   Family histor of BRCA. Positive genetics makers.  (2)  Has appt today at the high risk breast clinic with Dr Burr Medico    Outpatient Medications Prior to Visit  Medication Sig Dispense Refill  . albuterol (PROVENTIL HFA;VENTOLIN HFA) 108 (90 Base) MCG/ACT inhaler Inhale 2 puffs into the lungs every 6 (six) hours as needed for wheezing or shortness of breath. 1 Inhaler 3  . amoxicillin-clavulanate (AUGMENTIN) 875-125 MG tablet Take 1 tablet by mouth 2 (two) times daily for 10 days. 20 tablet 0  . aspirin 81 MG tablet Take 81 mg by mouth daily.      . cetirizine (ZYRTEC) 10 MG tablet Take 10 mg daily by mouth.    . fluticasone (FLONASE ALLERGY RELIEF) 50 MCG/ACT nasal spray Place 2 sprays daily into both nostrils. 16 g 2  . lisinopril-hydrochlorothiazide (PRINZIDE,ZESTORETIC) 10-12.5 MG tablet Take 1 tablet by mouth daily. 90 tablet 3  . montelukast (SINGULAIR) 10 MG tablet TAKE 1 TABLET BY MOUTH AT BEDTIME 90 tablet 1  . spironolactone  (ALDACTONE) 25 MG tablet TAKE 1 TABLET BY MOUTH EVERY DAY 90 tablet 1  . traMADol (ULTRAM) 50 MG tablet Take 1 tablet (50 mg total) by mouth every 6 (six) hours as needed. 15 tablet 0   No facility-administered medications prior to visit.     Review of Systems;  Patient denies headache, fevers, malaise, unintentional weight loss, skin rash, eye pain, sinus congestion and sinus pain, sore throat, dysphagia,  hemoptysis , cough, dyspnea, wheezing, chest pain, palpitations, orthopnea, edema, abdominal pain, nausea, melena, diarrhea, constipation, flank pain, dysuria, hematuria, urinary  Frequency, nocturia, numbness, tingling, seizures,  Focal weakness, Loss of consciousness,  Tremor, insomnia, depression, anxiety, and suicidal ideation.      Objective:  BP 102/66 (BP Location: Left Arm, Patient Position: Sitting, Cuff Size: Large)   Pulse 72   Temp 98.1 F (36.7 C) (Oral)   Resp 15   Ht '5\' 3"'  (1.6 m)   Wt 197 lb 12.8 oz (89.7 kg)   SpO2 97%   BMI 35.04 kg/m   BP Readings from Last 3 Encounters:  12/01/17 134/68  12/01/17 102/66  11/22/17 (!) 111/47    Wt Readings from Last 3 Encounters:  12/01/17 198 lb 4.8 oz (89.9 kg)  12/01/17 197 lb 12.8 oz (89.7 kg)  11/22/17 197 lb (89.4 kg)    General appearance: alert, cooperative and appears stated age Ears: normal TM's  and external ear canals both ears Throat: lips, mucosa, and tongue normal; teeth and gums normal Neck: no adenopathy, no carotid bruit, supple, symmetrical, trachea midline and thyroid not enlarged, symmetric, no tenderness/mass/nodules Back: symmetric, no curvature. ROM normal. No CVA tenderness. Lungs: clear to auscultation bilaterally Heart: regular rate and rhythm, S1, S2 normal, no murmur, click, rub or gallop Abdomen: LLQ tenderness to deep palpation, ; bowel sounds normal; no masses,  no organomegaly Pulses: 2+ and symmetric Skin: Skin color, texture, turgor normal. No rashes or lesions Lymph nodes:  Cervical, supraclavicular, and axillary nodes normal.  Lab Results  Component Value Date   HGBA1C 6.1 (H) 12/02/2017   HGBA1C 6.1 (H) 10/07/2017   HGBA1C 6.3 (H) 03/01/2017    Lab Results  Component Value Date   CREATININE 1.04 (H) 12/02/2017   CREATININE 0.99 11/22/2017   CREATININE 1.02 (H) 10/07/2017    Lab Results  Component Value Date   WBC 6.0 12/02/2017   HGB 13.6 12/02/2017   HCT 40.3 12/02/2017   PLT 236 12/02/2017   GLUCOSE 115 (H) 12/02/2017   CHOL 168 12/02/2017   TRIG 101 12/02/2017   HDL 47 12/02/2017   LDLDIRECT 109.0 08/17/2016   LDLCALC 101 (H) 12/02/2017   ALT 17 12/02/2017   AST 20 12/02/2017   NA 140 12/02/2017   K 4.3 12/02/2017   CL 105 12/02/2017   CREATININE 1.04 (H) 12/02/2017   BUN 18 12/02/2017   CO2 26 12/02/2017   TSH 2.02 02/12/2016   HGBA1C 6.1 (H) 12/02/2017   MICROALBUR 44.0 (H) 10/07/2017    Ct Abdomen Pelvis W Contrast  Result Date: 11/22/2017 CLINICAL DATA:  Acute left lower quadrant abdominal pain. EXAM: CT ABDOMEN AND PELVIS WITH CONTRAST TECHNIQUE: Multidetector CT imaging of the abdomen and pelvis was performed using the standard protocol following bolus administration of intravenous contrast. CONTRAST:  156m OMNIPAQUE IOHEXOL 300 MG/ML  SOLN COMPARISON:  None. FINDINGS: Lower chest: No acute abnormality. Hepatobiliary: No focal liver abnormality is seen. No gallstones, gallbladder wall thickening, or biliary dilatation. Pancreas: Unremarkable. No pancreatic ductal dilatation or surrounding inflammatory changes. Spleen: Normal in size without focal abnormality. Adrenals/Urinary Tract: Adrenal glands appear normal. Left parapelvic cysts are noted. No hydronephrosis or renal obstruction is noted. No renal or ureteral calculi are noted. Urinary bladder is unremarkable. Stomach/Bowel: The stomach and appendix appear normal. Focal sigmoid diverticulitis is noted. No definite abscess is noted. No evidence of bowel obstruction is noted.  Vascular/Lymphatic: No significant vascular findings are present. No enlarged abdominal or pelvic lymph nodes. Reproductive: Status post hysterectomy. No adnexal masses. Other: No abdominal wall hernia or abnormality. No abdominopelvic ascites. Musculoskeletal: No acute or significant osseous findings. IMPRESSION: Focal sigmoid diverticulitis.  No definite abscess is noted. Electronically Signed   By: JMarijo Conception M.D.   On: 11/22/2017 15:24    Assessment & Plan:   Problem List Items Addressed This Visit    Diverticulitis large intestine w/o perforation or abscess w/o bleeding    Symptoms are improving with empiric antibiotic therapy (augmentn  Advised take probioticfor three weeks, ad begin a high fiber diet in another 2 week s.   All inflammatory markers are  normal.  Lab Results  Component Value Date   ESRSEDRATE 7 12/02/2017   Lab Results  Component Value Date   CRP <0.8 12/02/2017   Lab Results  Component Value Date   WBC 6.0 12/02/2017   HGB 13.6 12/02/2017   HCT 40.3 12/02/2017   MCV 84.7 12/02/2017  PLT 236 12/02/2017          Other Visit Diagnoses    Diverticulitis of large intestine with perforation without abscess or bleeding    -  Primary   Relevant Orders   CBC with Differential/Platelet (Completed)   C-reactive protein (Completed)   Sedimentation rate (Completed)    A total of 25 minutes of face to face time was spent with patient more than half of which was spent  reviewing ER records,  in counselling about the above mentioned conditions  and coordination of care   I am having Vivia Ewing maintain her aspirin, albuterol, cetirizine, fluticasone, montelukast, and lisinopril-hydrochlorothiazide.  No orders of the defined types were placed in this encounter.   There are no discontinued medications.  Follow-up: No follow-ups on file.   Crecencio Mc, MD

## 2017-12-01 NOTE — Patient Instructions (Signed)
Diverticulitis °Diverticulitis is infection or inflammation of small pouches (diverticula) in the colon that form due to a condition called diverticulosis. Diverticula can trap stool (feces) and bacteria, causing infection and inflammation. °Diverticulitis may cause severe stomach pain and diarrhea. It may lead to tissue damage in the colon that causes bleeding. The diverticula may also burst (rupture) and cause infected stool to enter other areas of the abdomen. °Complications of diverticulitis can include: °· Bleeding. °· Severe infection. °· Severe pain. °· Rupture (perforation) of the colon. °· Blockage (obstruction) of the colon. ° °What are the causes? °This condition is caused by stool becoming trapped in the diverticula, which allows bacteria to grow in the diverticula. This leads to inflammation and infection. °What increases the risk? °You are more likely to develop this condition if: °· You have diverticulosis. The risk for diverticulosis increases if: °? You are overweight or obese. °? You use tobacco products. °? You do not get enough exercise. °· You eat a diet that does not include enough fiber. High-fiber foods include fruits, vegetables, beans, nuts, and whole grains. ° °What are the signs or symptoms? °Symptoms of this condition may include: °· Pain and tenderness in the abdomen. The pain is normally located on the left side of the abdomen, but it may occur in other areas. °· Fever and chills. °· Bloating. °· Cramping. °· Nausea. °· Vomiting. °· Changes in bowel routines. °· Blood in your stool. ° °How is this diagnosed? °This condition is diagnosed based on: °· Your medical history. °· A physical exam. °· Tests to make sure there is nothing else causing your condition. These tests may include: °? Blood tests. °? Urine tests. °? Imaging tests of the abdomen, including X-rays, ultrasounds, MRIs, or CT scans. ° °How is this treated? °Most cases of this condition are mild and can be treated at home.  Treatment may include: °· Taking over-the-counter pain medicines. °· Following a clear liquid diet. °· Taking antibiotic medicines by mouth. °· Rest. ° °More severe cases may need to be treated at a hospital. Treatment may include: °· Not eating or drinking. °· Taking prescription pain medicine. °· Receiving antibiotic medicines through an IV tube. °· Receiving fluids and nutrition through an IV tube. °· Surgery. ° °When your condition is under control, your health care provider may recommend that you have a colonoscopy. This is an exam to look at the entire large intestine. During the exam, a lubricated, bendable tube is inserted into the anus and then passed into the rectum, colon, and other parts of the large intestine. A colonoscopy can show how severe your diverticula are and whether something else may be causing your symptoms. °Follow these instructions at home: °Medicines °· Take over-the-counter and prescription medicines only as told by your health care provider. These include fiber supplements, probiotics, and stool softeners. °· If you were prescribed an antibiotic medicine, take it as told by your health care provider. Do not stop taking the antibiotic even if you start to feel better. °· Do not drive or use heavy machinery while taking prescription pain medicine. °General instructions °· Follow a full liquid diet or another diet as directed by your health care provider. After your symptoms improve, your health care provider may tell you to change your diet. He or she may recommend that you eat a diet that contains at least 25 g (25 grams) of fiber daily. Fiber makes it easier to pass stool. Healthy sources of fiber include: °? Berries. One cup   contains 4-8 grams of fiber. °? Beans or lentils. One half cup contains 5-8 grams of fiber. °? Green vegetables. One cup contains 4 grams of fiber. °· Exercise for at least 30 minutes, 3 times each week. You should exercise hard enough to raise your heart rate and  break a sweat. °· Keep all follow-up visits as told by your health care provider. This is important. You may need a colonoscopy. °Contact a health care provider if: °· Your pain does not improve. °· You have a hard time drinking or eating food. °· Your bowel movements do not return to normal. °Get help right away if: °· Your pain gets worse. °· Your symptoms do not get better with treatment. °· Your symptoms suddenly get worse. °· You have a fever. °· You vomit more than one time. °· You have stools that are bloody, black, or tarry. °Summary °· Diverticulitis is infection or inflammation of small pouches (diverticula) in the colon that form due to a condition called diverticulosis. Diverticula can trap stool (feces) and bacteria, causing infection and inflammation. °· You are at higher risk for this condition if you have diverticulosis and you eat a diet that does not include enough fiber. °· Most cases of this condition are mild and can be treated at home. More severe cases may need to be treated at a hospital. °· When your condition is under control, your health care provider may recommend that you have an exam called a colonoscopy. This exam can show how severe your diverticula are and whether something else may be causing your symptoms. °This information is not intended to replace advice given to you by your health care provider. Make sure you discuss any questions you have with your health care provider. °Document Released: 01/20/2005 Document Revised: 05/15/2016 Document Reviewed: 05/15/2016 °Elsevier Interactive Patient Education © 2018 Elsevier Inc. ° °

## 2017-12-02 ENCOUNTER — Encounter: Payer: BC Managed Care – PPO | Admitting: Nurse Practitioner

## 2017-12-02 ENCOUNTER — Other Ambulatory Visit
Admission: RE | Admit: 2017-12-02 | Discharge: 2017-12-02 | Disposition: A | Discharge status: Home / Self Care | Payer: BC Managed Care – PPO | Source: Ambulatory Visit | Attending: Internal Medicine | Admitting: Internal Medicine

## 2017-12-02 DIAGNOSIS — K5720 Diverticulitis of large intestine with perforation and abscess without bleeding: Secondary | ICD-10-CM | POA: Diagnosis not present

## 2017-12-02 DIAGNOSIS — E1121 Type 2 diabetes mellitus with diabetic nephropathy: Secondary | ICD-10-CM | POA: Diagnosis present

## 2017-12-02 LAB — COMPREHENSIVE METABOLIC PANEL
ALBUMIN: 4.2 g/dL (ref 3.5–5.0)
ALT: 17 U/L (ref 0–44)
AST: 20 U/L (ref 15–41)
Alkaline Phosphatase: 89 U/L (ref 38–126)
Anion gap: 9 (ref 5–15)
BUN: 18 mg/dL (ref 6–20)
CO2: 26 mmol/L (ref 22–32)
Calcium: 9.3 mg/dL (ref 8.9–10.3)
Chloride: 105 mmol/L (ref 98–111)
Creatinine, Ser: 1.04 mg/dL — ABNORMAL HIGH (ref 0.44–1.00)
GFR calc Af Amer: >60 mL/min (ref >60)
GFR, EST NON AFRICAN AMERICAN: 58 mL/min — AB (ref >60)
GLUCOSE: 115 mg/dL — AB (ref 70–99)
POTASSIUM: 4.3 mmol/L (ref 3.5–5.1)
Sodium: 140 mmol/L (ref 135–145)
Total Bilirubin: 0.6 mg/dL (ref 0.3–1.2)
Total Protein: 7.1 g/dL (ref 6.5–8.1)

## 2017-12-02 LAB — CBC WITH DIFFERENTIAL/PLATELET
Basophils Absolute: 0 10*3/uL (ref 0–0.1)
Basophils Relative: 1 %
Eosinophils Absolute: 0.1 10*3/uL (ref 0–0.7)
Eosinophils Relative: 1 %
HEMATOCRIT: 40.3 % (ref 35.0–47.0)
HEMOGLOBIN: 13.6 g/dL (ref 12.0–16.0)
LYMPHS ABS: 1.9 10*3/uL (ref 1.0–3.6)
Lymphocytes Relative: 31 %
MCH: 28.6 pg (ref 26.0–34.0)
MCHC: 33.8 g/dL (ref 32.0–36.0)
MCV: 84.7 fL (ref 80.0–100.0)
MONOS PCT: 7 %
Monocytes Absolute: 0.4 10*3/uL (ref 0.2–0.9)
NEUTROS PCT: 60 %
Neutro Abs: 3.6 10*3/uL (ref 1.4–6.5)
Platelets: 236 10*3/uL (ref 150–440)
RBC: 4.76 MIL/uL (ref 3.80–5.20)
RDW: 13.2 % (ref 11.5–14.5)
WBC: 6 10*3/uL (ref 3.6–11.0)

## 2017-12-02 LAB — HEMOGLOBIN A1C
HEMOGLOBIN A1C: 6.1 % — AB (ref 4.8–5.6)
Mean Plasma Glucose: 128.37 mg/dL

## 2017-12-02 LAB — LIPID PANEL
Cholesterol: 168 mg/dL (ref 0–200)
HDL: 47 mg/dL (ref >40)
LDL CALC: 101 mg/dL — AB (ref 0–99)
Total CHOL/HDL Ratio: 3.6 RATIO
Triglycerides: 101 mg/dL (ref <150)
VLDL: 20 mg/dL (ref 0–40)

## 2017-12-02 LAB — C-REACTIVE PROTEIN: CRP: <0.8 mg/dL (ref <1.0)

## 2017-12-02 LAB — SEDIMENTATION RATE: Sed Rate: 7 mm/hr (ref 0–30)

## 2017-12-04 NOTE — Assessment & Plan Note (Addendum)
Symptoms are improving with empiric antibiotic therapy (augmentn  Advised take probioticfor three weeks, ad begin a high fiber diet in another 2 week s.   All inflammatory markers are  normal.  Lab Results  Component Value Date   ESRSEDRATE 7 12/02/2017   Lab Results  Component Value Date   CRP <0.8 12/02/2017   Lab Results  Component Value Date   WBC 6.0 12/02/2017   HGB 13.6 12/02/2017   HCT 40.3 12/02/2017   MCV 84.7 12/02/2017   PLT 236 12/02/2017

## 2017-12-08 ENCOUNTER — Telehealth: Payer: Self-pay | Admitting: Genetic Counselor

## 2017-12-08 NOTE — Telephone Encounter (Signed)
Patient received a bill for $250.  Discussed that regardless of her EOB, this is the contact from the lab, and this should be the amount she is responsible for.  Patient will also call customer service at [facility].

## 2018-04-03 ENCOUNTER — Telehealth: Payer: Self-pay

## 2018-04-03 DIAGNOSIS — E1121 Type 2 diabetes mellitus with diabetic nephropathy: Secondary | ICD-10-CM

## 2018-04-03 DIAGNOSIS — E785 Hyperlipidemia, unspecified: Secondary | ICD-10-CM

## 2018-04-03 DIAGNOSIS — I10 Essential (primary) hypertension: Secondary | ICD-10-CM

## 2018-04-03 NOTE — Telephone Encounter (Signed)
Copied from [place] [id number]. Topic: General - Inquiry >> Apr 03, 2018 11:54 AM Scherrie Gerlach wrote: Reason for CRM: pt states she always goes to [facility] for her labs prior to appt. Pt would like the dr to put in orders so she can go by there Friday, 04/07/18, due to the lab there not being able to draw her labs.

## 2018-04-03 NOTE — Addendum Note (Signed)
Addended by: Adair Laundry on: 04/03/2018 04:48 PM   Modules accepted: Orders

## 2018-04-03 NOTE — Telephone Encounter (Signed)
Perfect thanks

## 2018-04-03 NOTE — Telephone Encounter (Signed)
Ordered CMP, LDL, A1C, and Lipid. Is there anything else that needs to be ordered? Pt would like to go by [facility] on Friday 12/13 to have these drawn.

## 2018-04-07 ENCOUNTER — Other Ambulatory Visit
Admission: RE | Admit: 2018-04-07 | Discharge: 2018-04-07 | Disposition: A | Discharge status: Home / Self Care | Payer: BC Managed Care – PPO | Source: Ambulatory Visit | Attending: Internal Medicine | Admitting: Internal Medicine

## 2018-04-07 DIAGNOSIS — E785 Hyperlipidemia, unspecified: Secondary | ICD-10-CM | POA: Diagnosis present

## 2018-04-07 DIAGNOSIS — E1121 Type 2 diabetes mellitus with diabetic nephropathy: Secondary | ICD-10-CM | POA: Diagnosis present

## 2018-04-07 DIAGNOSIS — I10 Essential (primary) hypertension: Secondary | ICD-10-CM | POA: Diagnosis present

## 2018-04-07 LAB — COMPREHENSIVE METABOLIC PANEL
ALT: 20 U/L (ref 0–44)
ANION GAP: 8 (ref 5–15)
AST: 18 U/L (ref 15–41)
Albumin: 4.1 g/dL (ref 3.5–5.0)
Alkaline Phosphatase: 80 U/L (ref 38–126)
BUN: 21 mg/dL — ABNORMAL HIGH (ref 6–20)
CHLORIDE: 107 mmol/L (ref 98–111)
CO2: 22 mmol/L (ref 22–32)
Calcium: 9 mg/dL (ref 8.9–10.3)
Creatinine, Ser: 0.92 mg/dL (ref 0.44–1.00)
GFR calc non Af Amer: >60 mL/min (ref >60)
Glucose, Bld: 121 mg/dL — ABNORMAL HIGH (ref 70–99)
Potassium: 4.2 mmol/L (ref 3.5–5.1)
SODIUM: 137 mmol/L (ref 135–145)
Total Bilirubin: 0.6 mg/dL (ref 0.3–1.2)
Total Protein: 6.8 g/dL (ref 6.5–8.1)

## 2018-04-07 LAB — LIPID PANEL
CHOL/HDL RATIO: 4.1 ratio
Cholesterol: 179 mg/dL (ref 0–200)
HDL: 44 mg/dL (ref >40)
LDL CALC: 101 mg/dL — AB (ref 0–99)
TRIGLYCERIDES: 171 mg/dL — AB (ref <150)
VLDL: 34 mg/dL (ref 0–40)

## 2018-04-07 LAB — HEMOGLOBIN A1C
Hgb A1c MFr Bld: 6.1 % — ABNORMAL HIGH (ref 4.8–5.6)
Mean Plasma Glucose: 128.37 mg/dL

## 2018-04-08 LAB — LDL CHOLESTEROL, DIRECT: Direct LDL: 95 mg/dL (ref 0–99)

## 2018-04-13 ENCOUNTER — Ambulatory Visit: Payer: BC Managed Care – PPO | Admitting: Internal Medicine

## 2018-04-13 ENCOUNTER — Encounter: Payer: Self-pay | Admitting: Internal Medicine

## 2018-04-13 VITALS — BP 120/78 | HR 78 | Temp 97.6°F | Resp 15 | Ht 63.0 in | Wt 201.4 lb

## 2018-04-13 DIAGNOSIS — I10 Essential (primary) hypertension: Secondary | ICD-10-CM | POA: Diagnosis not present

## 2018-04-13 DIAGNOSIS — E669 Obesity, unspecified: Secondary | ICD-10-CM | POA: Diagnosis not present

## 2018-04-13 DIAGNOSIS — E1121 Type 2 diabetes mellitus with diabetic nephropathy: Secondary | ICD-10-CM | POA: Diagnosis not present

## 2018-04-13 NOTE — Patient Instructions (Addendum)
If you are still with blue cross next April.  You'll need to be referred to the [facility] Breast  Clinic instead of seeing Dr Burr Medico  This is  My  example of a  "Low GI"  Diet:  It is similar to the KET diet and will allow you to lose 4 to 8  lbs  per month if you follow it carefully.  Your goal with exercise is a minimum of 30 minutes of aerobic exercise 5 days per week (Walking does not count once it becomes easy!)    All of the foods can be found at grocery stores and in bulk at [employer].  The Atkins protein bars and shakes are available in more varieties at [employer], [employer] and [place].     7 AM Breakfast:  Choose from the following:  Low carbohydrate Protein  Shakes (I recommend the  Premier Protein   EAS AdvantEdge "Carb Control" shakes  Or the Atkins shakes all are under 3 net carbs)     a scrambled egg/bacon/cheese burrito made with [employer]'s "carb balance" whole wheat tortilla  (about 10 net carbs )  Regulatory affairs officer (basically a quiche without the pastry crust) that is eaten cold and very convenient way to get your eggs.  8 carbs)  If you make your own protein shakes, avoid bananas and pineapple,  And use low carb greek yogurt or original /unsweetened almond or soy milk    Avoid cereal and bananas, oatmeal and cream of wheat and grits. They are loaded with carbohydrates!   10 AM: high protein snack:  Protein bar by Atkins (the snack size, under 200 cal, usually < 6 net carbs).    A stick of cheese:  Around 1 carb,  100 cal     Dannon Light n Fit [place] Yogurt  (80 cal, 8 carbs)  Other so called "protein bars" and Greek yogurts tend to be loaded with carbohydrates.  Remember, in food advertising, the word "energy" is synonymous for " carbohydrate."  Lunch:   A Sandwich using the bread choices listed, Can use any  Eggs,  lunchmeat, grilled meat or canned tuna), avocado, regular mayo/mustard  and cheese.  A Salad using blue cheese, ranch,  Goddess or  vinagrette,  Avoid taco shells, croutons or "confetti" and no "candied nuts" but regular nuts OK.   No pretzels, nabs  or chips.  Pickles and miniature sweet peppers are a good low carb alternative that provide a "crunch"  The bread is the only source of carbohydrate in a sandwich and  can be decreased by trying some of the attached alternatives to traditional loaf bread   Avoid "Low fat dressings, as well as [place] dressings They are loaded with sugar!   3 PM/ Mid day  Snack:  Consider  1 ounce of  almonds, walnuts, pistachios, pecans, peanuts,  Macadamia nuts or a nut medley.  Avoid "granola and granola bars "  Mixed nuts are ok in moderation as long as there are no raisins,  cranberries or dried fruit.   KIND bars are OK if you get the low glycemic index variety   Try the prosciutto/mozzarella cheese sticks by [employer]  In deli /backery section   High protein      6 PM  Dinner:     Meat/fowl/fish with a green salad, and either broccoli, cauliflower, green beans, spinach, brussel sprouts or  Lima beans. DO NOT BREAD THE PROTEIN!!  There is a low carb pasta by Dreamfield's that is acceptable and tastes great: only 5 digestible carbs/serving.( All grocery stores but BJs carry it ) Several ready made meals are available low carb:   Try Michel Angelo's chicken piccata or chicken or eggplant parm over low carb pasta.(Lowes and BJs)   Marjory Lies Sanchez's "Carnitas" (pulled pork, no sauce,  0 carbs) or his beef pot roast to make a dinner burrito (at [employer]J's)  Pesto over low carb pasta (bj's sells a good quality pesto in the center refrigerated section of the deli   Try satueeing  Cheral Marker with mushroooms as a good side   [employer] makes a mashed cauliflower that tastes like mashed potatoes  Whole wheat pasta is still full of digestible carbs and  Not as low in glycemic index as Dreamfield's.   Brown rice is still rice,  So skip the rice and noodles if you eat [place] or [place]  (or at least limit to 1/2 cup)  9 PM snack :   Breyer's "low carb" fudgsicle or  ice cream bar (Carb Smart line), or  Weight Watcher's ice cream bar , or another "no sugar added" ice cream;  a serving of fresh berries/cherries with whipped cream   Cheese or DANNON'S LlGHT N FIT GREEK YOGURT  8 ounces of [place] unsweetened almond/cococunut milk    Treat yourself to a parfait made with whipped cream blueberiies, walnuts and vanilla greek yogurt  Avoid bananas, pineapple, grapes  and watermelon on a regular basis because they are high in sugar.  THINK OF THEM AS DESSERT  Remember that snack Substitutions should be less than 10 NET carbs per serving and meals < 20 carbs. Remember to subtract fiber grams to get the "net carbs."     If

## 2018-04-13 NOTE — Progress Notes (Signed)
Subjective:  Patient ID: Nichole Jensen, female    DOB: 1961/02/27  Age: 57 y.o. MRN: [id number]  CC: Diagnoses of Obesity (BMI 30-39.9), Type II diabetes mellitus with nephropathy ([place]), and Essential hypertension were pertinent to this visit.  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  Works 80 hours a week  Nights,  Included.    Will be retired by June   Too much wine and cheeses.  Labs reviewed , stable   Discussed KETO diet bc of friends' success .    Mammogram due in Spring , saw Burr Medico at the cancer center  Nmammogreeds referral to [facility] for next year's screening .   Outpatient Medications Prior to Visit  Medication Sig Dispense Refill  . aspirin 81 MG tablet Take 81 mg by mouth daily.      Marland Kitchen lisinopril-hydrochlorothiazide (PRINZIDE,ZESTORETIC) 10-12.5 MG tablet Take 1 tablet by mouth daily. 90 tablet 3  . albuterol (PROVENTIL HFA;VENTOLIN HFA) 108 (90 Base) MCG/ACT inhaler Inhale 2 puffs into the lungs every 6 (six) hours as needed for wheezing or shortness of breath. (Patient not taking: Reported on 04/13/2018) 1 Inhaler 3  . cetirizine (ZYRTEC) 10 MG tablet Take 10 mg daily by mouth.    . fluticasone (FLONASE ALLERGY RELIEF) 50 MCG/ACT nasal spray Place 2 sprays daily into both nostrils. (Patient not taking: Reported on 04/13/2018) 16 g 2  . montelukast (SINGULAIR) 10 MG tablet TAKE 1 TABLET BY MOUTH AT BEDTIME (Patient not taking: Reported on 04/13/2018) 90 tablet 1   No facility-administered medications prior to visit.     Review of Systems;  Patient denies headache, fevers, malaise, unintentional weight loss, skin rash, eye pain, sinus congestion and sinus pain, sore throat, dysphagia,  hemoptysis , cough, dyspnea, wheezing, chest pain, palpitations, orthopnea, edema, abdominal pain, nausea, melena, diarrhea,  constipation, flank pain, dysuria, hematuria, urinary  Frequency, nocturia, numbness, tingling, seizures,  Focal weakness, Loss of consciousness,  Tremor, insomnia, depression, anxiety, and suicidal ideation.      Objective:  BP 120/78 (BP Location: Left Arm, Patient Position: Sitting, Cuff Size: Normal)   Pulse 78   Temp 97.6 F (36.4 C) (Oral)   Resp 15   Ht 5\' 3"  (1.6 m)   Wt 201 lb 6.4 oz (91.4 kg)   SpO2 96%   BMI 35.68 kg/m   BP Readings from Last 3 Encounters:  04/13/18 120/78  12/01/17 134/68  12/01/17 102/66    Wt Readings from Last 3 Encounters:  04/13/18 201 lb 6.4 oz (91.4 kg)  12/01/17 198 lb 4.8 oz (89.9 kg)  12/01/17 197 lb 12.8 oz (89.7 kg)    General appearance: alert, cooperative and appears stated age Ears: normal TM's and external ear canals both ears Throat: lips, mucosa, and tongue normal; teeth and gums normal Neck: no adenopathy, no carotid bruit, supple, symmetrical, trachea midline and thyroid not enlarged, symmetric, no tenderness/mass/nodules Back: symmetric, no curvature. ROM normal. No CVA tenderness. Lungs: clear to auscultation bilaterally Heart: regular rate and rhythm, S1, S2 normal, no murmur, click, rub or gallop Abdomen: soft, non-tender; bowel sounds normal; no masses,  no organomegaly Pulses: 2+ and symmetric Skin: Skin color, texture, turgor normal. No rashes or lesions Lymph nodes: Cervical, supraclavicular, and axillary nodes normal.  Lab Results  Component Value Date   HGBA1C 6.1 (H) 04/07/2018  HGBA1C 6.1 (H) 12/02/2017   HGBA1C 6.1 (H) 10/07/2017    Lab Results  Component Value Date   CREATININE 0.92 04/07/2018   CREATININE 1.04 (H) 12/02/2017   CREATININE 0.99 11/22/2017    Lab Results  Component Value Date   WBC 6.0 12/02/2017   HGB 13.6 12/02/2017   HCT 40.3 12/02/2017   PLT 236 12/02/2017   GLUCOSE 121 (H) 04/07/2018   CHOL 179 04/07/2018   TRIG 171 (H) 04/07/2018   HDL 44 04/07/2018   LDLDIRECT 95  04/07/2018   LDLCALC 101 (H) 04/07/2018   ALT 20 04/07/2018   AST 18 04/07/2018   NA 137 04/07/2018   K 4.2 04/07/2018   CL 107 04/07/2018   CREATININE 0.92 04/07/2018   BUN 21 (H) 04/07/2018   CO2 22 04/07/2018   TSH 2.02 02/12/2016   HGBA1C 6.1 (H) 04/07/2018   MICROALBUR 44.0 (H) 10/07/2017    No results found.  Assessment & Plan:   Problem List Items Addressed This Visit    Essential hypertension    Well controlled on current regimen. Renal function stable, no changes today.  Lab Results  Component Value Date   CREATININE 0.92 04/07/2018   Lab Results  Component Value Date   NA 137 04/07/2018   K 4.2 04/07/2018   CL 107 04/07/2018   CO2 22 04/07/2018         Obesity (BMI 30-39.9)    I have reinforced her need to make lifestyle changes with adding exercise and following  A low carb diet with the goal of weight loss with goal of 10% of body weigh over the next 6 months using a low glycemic index diet and regular exercise a minimum of 5 days per week.        Type II diabetes mellitus with nephropathy (H[facility])    Diet controlled.   Reminded to have her annual eye exams,  Follow up every 3 months and pneumovax vaccine given   Her proteinuria has been addressed with addition of ace inhibitor,  Lab Results  Component Value Date   MICROALBUR 44.0 (H) 10/07/2017   Lab Results  Component Value Date   HGBA1C 6.1 (H) 04/07/2018           A total of 40 minutes of face to face time was spent with patient more than half of which was spent in counselling and coordination of care   I am having Vivia Ewing maintain her aspirin, albuterol, cetirizine, fluticasone, montelukast, and lisinopril-hydrochlorothiazide.  No orders of the defined types were placed in this encounter.   There are no discontinued medications.  Follow-up: Return in about 6 months (around 10/13/2018).   Crecencio Mc, MD

## 2018-04-16 NOTE — Assessment & Plan Note (Signed)
Well controlled on current regimen. Renal function stable, no changes today.  Lab Results  Component Value Date   CREATININE 0.92 04/07/2018   Lab Results  Component Value Date   NA 137 04/07/2018   K 4.2 04/07/2018   CL 107 04/07/2018   CO2 22 04/07/2018

## 2018-04-16 NOTE — Assessment & Plan Note (Addendum)
I have reinforced her need to make lifestyle changes with adding exercise and following  A low carb diet with the goal of weight loss with goal of 10% of body weigh over the next 6 months using a low glycemic index diet and regular exercise a minimum of 5 days per week.

## 2018-04-16 NOTE — Assessment & Plan Note (Signed)
Diet controlled.   Reminded to have her annual eye exams,  Follow up every 3 months and pneumovax vaccine given   Her proteinuria has been addressed with addition of ace inhibitor,  Lab Results  Component Value Date   MICROALBUR 44.0 (H) 10/07/2017   Lab Results  Component Value Date   HGBA1C 6.1 (H) 04/07/2018

## 2018-07-31 ENCOUNTER — Telehealth: Payer: Self-pay

## 2018-07-31 NOTE — Telephone Encounter (Signed)
Appointment has been canceled. Copied from [place] [id number]. Topic: Appointment Scheduling - Scheduling Inquiry for Clinic >> Jul 31, 2018  1:33 PM Yvette Rack wrote: Reason for CRM: Pt stated she is healthy and would like to cancel the appt.

## 2018-08-14 ENCOUNTER — Ambulatory Visit: Payer: BC Managed Care – PPO | Admitting: Internal Medicine

## 2018-08-29 IMAGING — CT CT ABD-PELV W/ CM
2 of 5 series · 16 of 46 positions shown, 18 images · IV contrast (APPLIED)
Comparison: None.

CLINICAL DATA: Acute left lower quadrant abdominal pain.

EXAM:
CT ABDOMEN AND PELVIS WITH CONTRAST
TECHNIQUE: Multidetector CT imaging of the abdomen and pelvis was performed
using the standard protocol following bolus administration of
intravenous contrast.
CONTRAST:  100mL OMNIPAQUE IOHEXOL 300 MG/ML  SOLN

[Series 2: axial st · axial · 0.98mm/px · z∈[-1080,-660]mm · 13 of 94 slices shown, 15 images]
[im 5/94  soft-tissue]
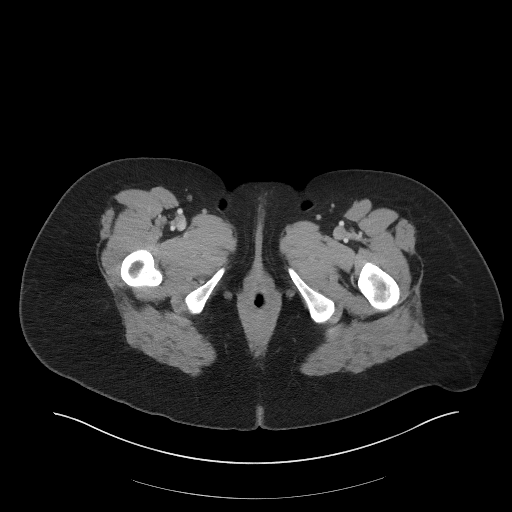
[im 5/94  bone]
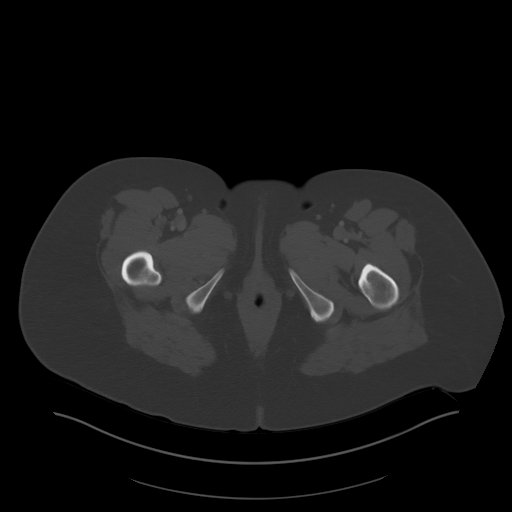
[im 15/94  soft-tissue]
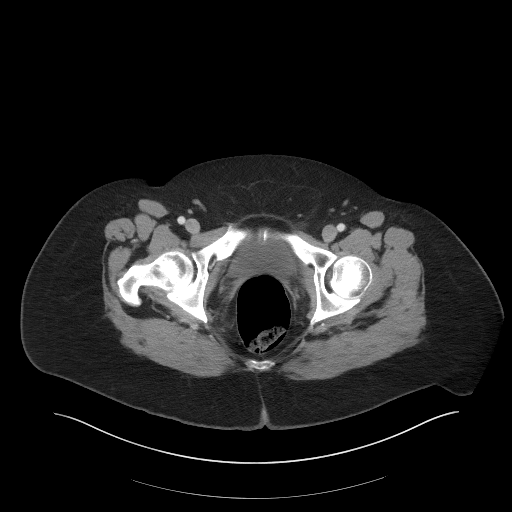
[im 20/94  soft-tissue]
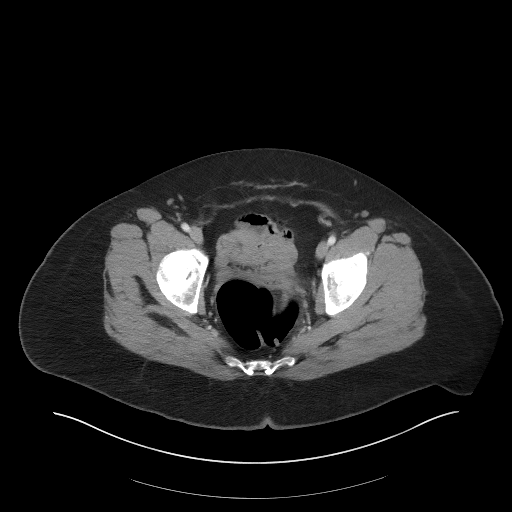
[im 25/94  soft-tissue]
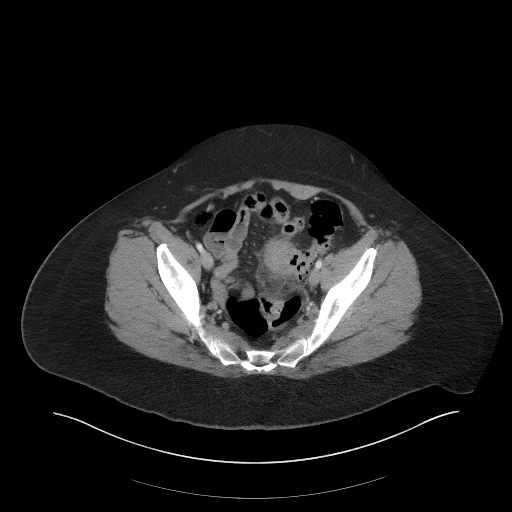
[im 35/94  soft-tissue]
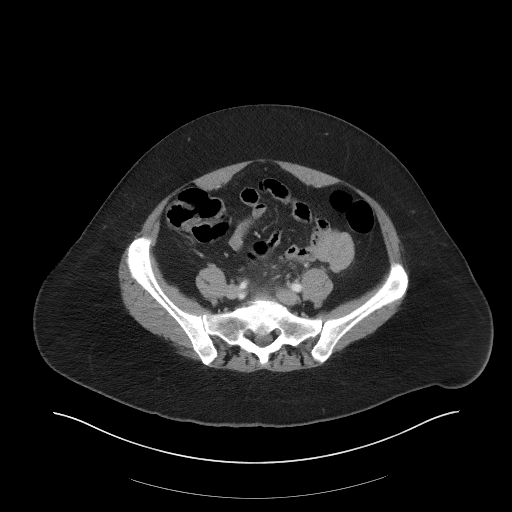
[im 40/94  soft-tissue]
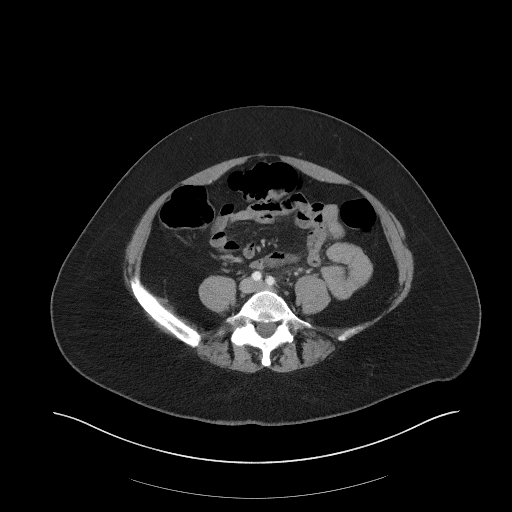
[im 49/94  soft-tissue]
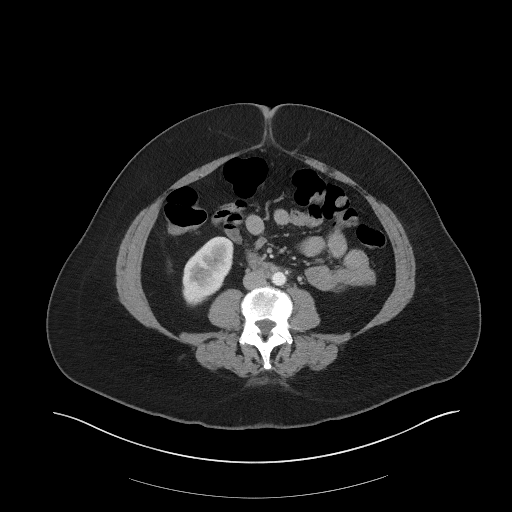
[im 54/94  soft-tissue]
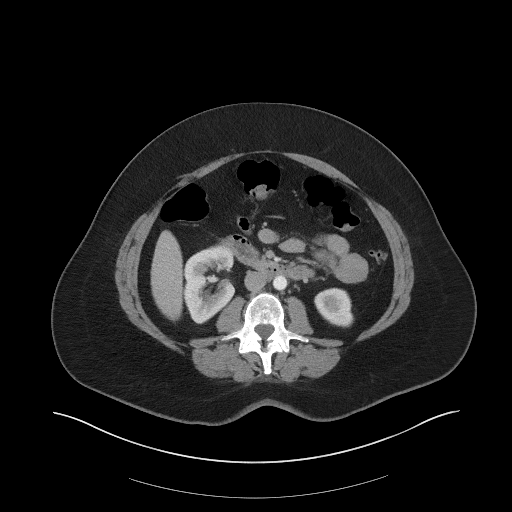
[im 59/94  soft-tissue]
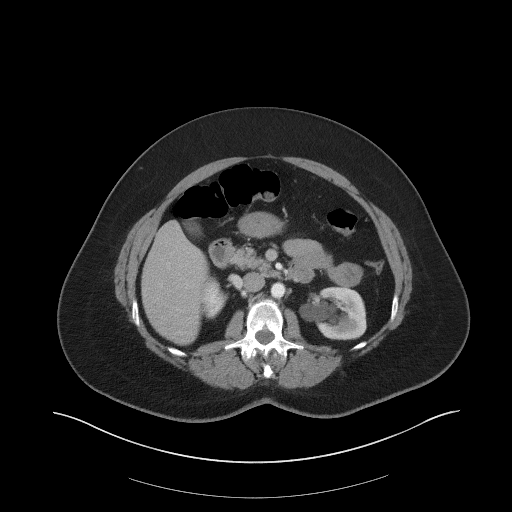
[im 59/94  bone]
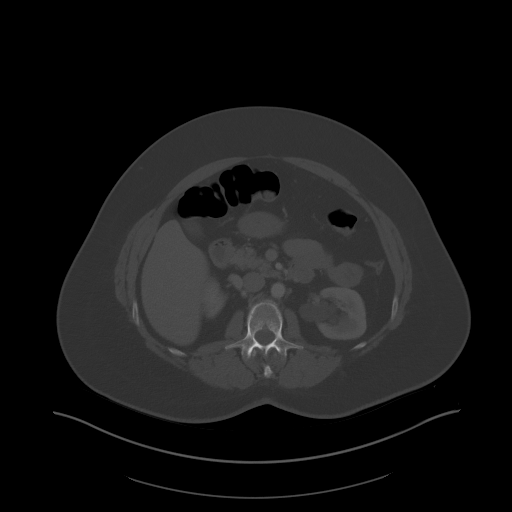
[im 69/94  soft-tissue]
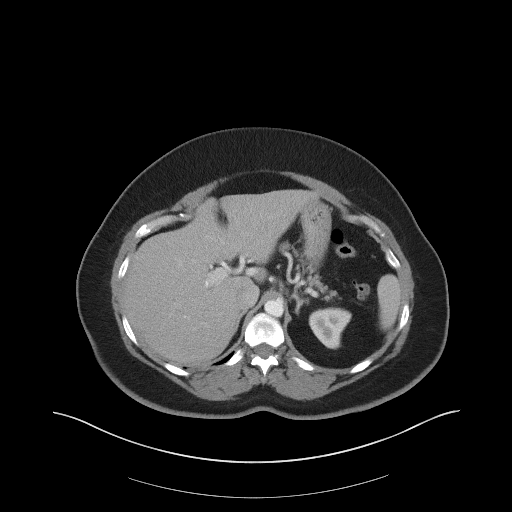
[im 74/94  soft-tissue]
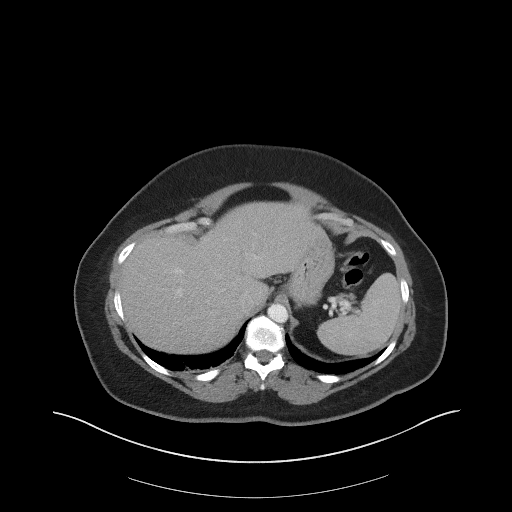
[im 79/94  soft-tissue]
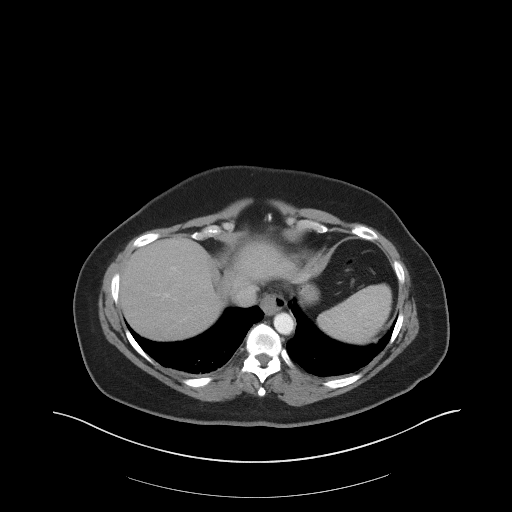
[im 89/94  soft-tissue]
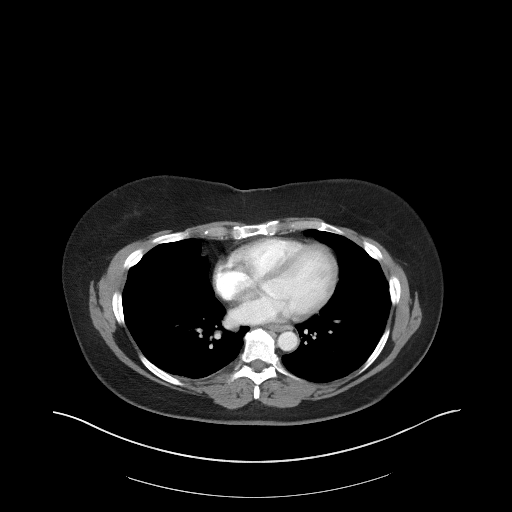

[Series 5: coronal st · coronal · 0.75mm/px · 3 of 101 slices shown]
[im 34/101  soft-tissue]
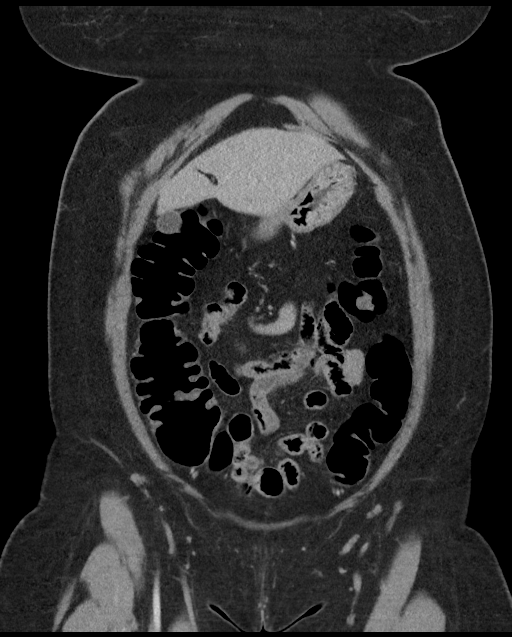
[im 45/101  soft-tissue]
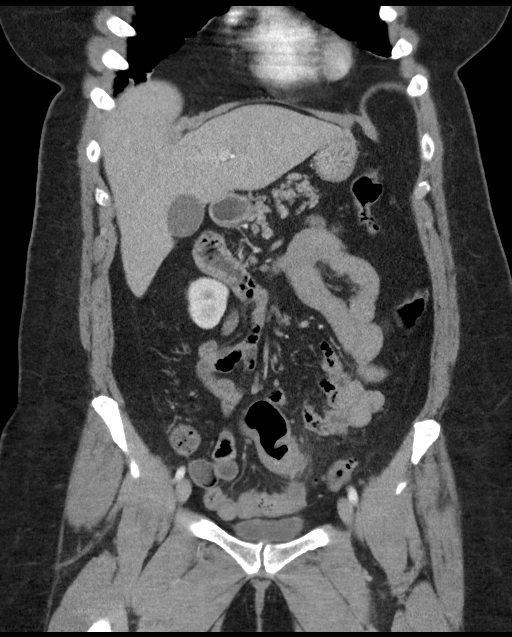
[im 56/101  soft-tissue]
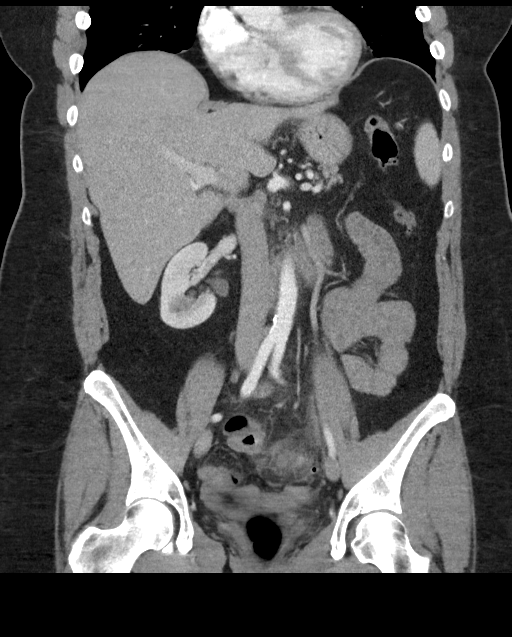

[16 of 46 positions shown; findings below may reference images not displayed]

FINDINGS: Lower chest: No acute abnormality.

Hepatobiliary: No focal liver abnormality is seen. No gallstones,
gallbladder wall thickening, or biliary dilatation.

Pancreas: Unremarkable. No pancreatic ductal dilatation or
surrounding inflammatory changes.

Spleen: Normal in size without focal abnormality.

Adrenals/Urinary Tract: Adrenal glands appear normal. Left
parapelvic cysts are noted. No hydronephrosis or renal obstruction
is noted. No renal or ureteral calculi are noted. Urinary bladder is
unremarkable.

Stomach/Bowel: The stomach and appendix appear normal. Focal sigmoid
diverticulitis is noted. No definite abscess is noted. No evidence
of bowel obstruction is noted.

Vascular/Lymphatic: No significant vascular findings are present. No
enlarged abdominal or pelvic lymph nodes.

Reproductive: Status post hysterectomy. No adnexal masses.

Other: No abdominal wall hernia or abnormality. No abdominopelvic
ascites.

Musculoskeletal: No acute or significant osseous findings.
IMPRESSION: Focal sigmoid diverticulitis.  No definite abscess is noted.

## 2018-11-15 ENCOUNTER — Telehealth: Payer: Self-pay

## 2018-11-15 NOTE — Telephone Encounter (Signed)
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

## 2018-11-17 ENCOUNTER — Other Ambulatory Visit: Payer: Self-pay | Admitting: Internal Medicine

## 2018-11-17 DIAGNOSIS — Z1231 Encounter for screening mammogram for malignant neoplasm of breast: Secondary | ICD-10-CM

## 2018-12-01 ENCOUNTER — Ambulatory Visit: Payer: BC Managed Care – PPO | Admitting: Hematology

## 2018-12-13 ENCOUNTER — Other Ambulatory Visit: Payer: Self-pay | Admitting: Internal Medicine

## 2018-12-13 MED ORDER — TELMISARTAN-HCTZ 40-12.5 MG PO TABS: 1.0000 | ORAL_TABLET | Freq: Every day | ORAL | 1 refills | Status: DC

## 2018-12-20 ENCOUNTER — Ambulatory Visit
Admission: RE | Admit: 2018-12-20 | Discharge: 2018-12-20 | Disposition: A | Discharge status: Home / Self Care | Payer: BC Managed Care – PPO | Source: Ambulatory Visit | Attending: Internal Medicine | Admitting: Internal Medicine

## 2018-12-20 DIAGNOSIS — Z1231 Encounter for screening mammogram for malignant neoplasm of breast: Secondary | ICD-10-CM | POA: Diagnosis present

## 2018-12-21 NOTE — Progress Notes (Signed)
[place]   [phone] [phone]   Clinic Follow up Note   Patient Care Team: Crecencio Mc, MD as PCP - General (Internal Medicine)  Date of Service:  12/22/2018  CHIEF COMPLAINT: F/u of RAD50 mutation, High risk for breast cancer  CURRENT THERAPY:  Surveillance   INTERVAL HISTORY:  Nichole Jensen is here for a follow up RAD50 mutation. She was last seen by me 1 year ago. She presents to the clinic alone. She notes she has stopped caffeine, she has not been sick and she is now off most medications except HTN and aspirin meds. She has retired to help take care of her mother. She is exercising more to lose weight. She notes her breast pain has resolved. Her Mammogram this month was normal. She notes another cousin was diagnosed with stage 4 colon cancer.  Her last colonoscopy was 09/2012. She will repeat in 5-10 years. She had abdominal pain in 09/2017 but was found to have diverticulitis by CT scan. Pain has not recurrence.     REVIEW OF SYSTEMS:   Constitutional: Denies fevers, chills or abnormal weight loss Eyes: Denies blurriness of vision Ears, nose, mouth, throat, and face: Denies mucositis or sore throat Respiratory: Denies cough, dyspnea or wheezes Cardiovascular: Denies palpitation, chest discomfort or lower extremity swelling Gastrointestinal:  Denies nausea, heartburn or change in bowel habits Skin: Denies abnormal skin rashes Lymphatics: Denies new lymphadenopathy or easy bruising Neurological:Denies numbness, tingling or new weaknesses Behavioral/Psych: Mood is stable, no new changes  All other systems were reviewed with the patient and are negative.  MEDICAL HISTORY:  Past Medical History:  Diagnosis Date  . Allergy   . Family history of brain cancer   . Family history of breast cancer in female   . Family history of colon cancer   . Hyperlipidemia   . Hypertension     SURGICAL HISTORY: Past Surgical History:   Procedure Laterality Date  . ABDOMINAL HYSTERECTOMY  2005   endometriosis and adenomyosis, Dr. Rayford Halsted  . BREAST BIOPSY Right 2001   CORE W/CLIP - NEG  . BREAST CYST ASPIRATION Right 2001   NEG  . BREAST SURGERY  2000   cyst 2000; right; benign  . TONSILLECTOMY  1968    I have reviewed the social history and family history with the patient and they are unchanged from previous note.  ALLERGIES:  is allergic to macrodantin.  MEDICATIONS:  Current Outpatient Medications  Medication Sig Dispense Refill  . aspirin 81 MG tablet Take 81 mg by mouth daily.      Marland Kitchen telmisartan-hydrochlorothiazide (MICARDIS HCT) 40-12.5 MG tablet Take 1 tablet by mouth at bedtime. 90 tablet 1   No current facility-administered medications for this visit.     PHYSICAL EXAMINATION: ECOG PERFORMANCE STATUS: 0 - Asymptomatic  Vitals:   12/22/18 1047  BP: 119/67  Pulse: 72  Resp: 18  Temp: 98.3 F (36.8 C)  SpO2: 99%   Filed Weights   12/22/18 1047  Weight: 200 lb 11.2 oz (91 kg)    GENERAL:alert, no distress and comfortable SKIN: skin color, texture, turgor are normal, no rashes or significant lesions EYES: normal, Conjunctiva are pink and non-injected, sclera clear  NECK: supple, thyroid normal size, non-tender, without nodularity LYMPH:  no palpable lymphadenopathy in the cervical, axillary  LUNGS: clear to auscultation and percussion with normal breathing effort HEART: regular rate & rhythm and no murmurs and no lower extremity edema ABDOMEN:abdomen soft, non-tender and normal bowel  sounds Musculoskeletal:no cyanosis of digits and no clubbing  NEURO: alert & oriented x 3 with fluent speech, no focal motor/sensory deficits BREAST: (+) Lumpy breast tissue. No true palpable mass, nodules or adenopathy bilaterally. Breast exam benign.   LABORATORY DATA:  I have reviewed the data as listed CBC Latest Ref Rng & Units 12/02/2017 11/22/2017 08/17/2016  WBC 3.6 - 11.0 K/uL 6.0 16.2(H) 7.7   Hemoglobin 12.0 - 16.0 g/dL 13.6 13.3 13.7  Hematocrit 35.0 - 47.0 % 40.3 38.8 40.9  Platelets 150 - 440 K/uL 236 197 216.0     CMP Latest Ref Rng & Units 04/07/2018 12/02/2017 11/22/2017  Glucose 70 - 99 mg/dL 121(H) 115(H) 128(H)  BUN 6 - 20 mg/dL 21(H) 18 14  Creatinine 0.44 - 1.00 mg/dL 0.92 1.04(H) 0.99  Sodium 135 - 145 mmol/L 137 140 139  Potassium 3.5 - 5.1 mmol/L 4.2 4.3 3.9  Chloride 98 - 111 mmol/L 107 105 104  CO2 22 - 32 mmol/L 22 26 25   Calcium 8.9 - 10.3 mg/dL 9.0 9.3 9.2  Total Protein 6.5 - 8.1 g/dL 6.8 7.1 7.3  Total Bilirubin 0.3 - 1.2 mg/dL 0.6 0.6 0.6  Alkaline Phos 38 - 126 U/L 80 89 77  AST 15 - 41 U/L 18 20 15   ALT 0 - 44 U/L 20 17 15       RADIOGRAPHIC STUDIES: I have personally reviewed the radiological images as listed and agreed with the findings in the report. Mm 3d Screen Breast Bilateral  Result Date: 12/20/2018 CLINICAL DATA:  Screening. EXAM: DIGITAL SCREENING BILATERAL MAMMOGRAM WITH TOMO AND CAD COMPARISON:  Previous exam(s). ACR Breast Density Category b: There are scattered areas of fibroglandular density. FINDINGS: There are no findings suspicious for malignancy. Images were processed with CAD. IMPRESSION: No mammographic evidence of malignancy. A result letter of this screening mammogram will be mailed directly to the patient. RECOMMENDATION: Screening mammogram in one year. (Code:SM-B-01Y) BI-RADS CATEGORY  1: Negative. Electronically Signed   By: Lillia Mountain M.D.   On: 12/20/2018 15:25     ASSESSMENT & PLAN:  Nichole Jensen is a 58 y.o. female with    1. Pathogenic RAD50 mutation heterozygous. - She was initially referred to genetic counseling by Dr. Barry Dienes due to family history of cancer and breast tenderness. She has no personal history of cancer.  -Her 09/2017 genetic testing was positive for RAD50, which has been reported to increaser susceptibility to breast cancer. However the exact risk of breast cancer is unknown. Relevant  literature is very limited. The RAD50 gene mutation was studied in a case and control study, and the study showed the MRE11A-RAD50-Nibrin (MRN) genes are indeed intermediate-risk breast cancer susceptibility genes (odds ratio (OR)=2.88, P=0.0090) (Breast Cancer Res. 2014 Jun 3;16(3):R58).  -Per NCCN guideline, RAD50 pathogenic mutation increases risk of breast cancer, however there is insufficient evidence to recommend prophylactic surgery or screening MRI. -I used the [employer], and calculated her time risk of breast cancer is about 14%, slightly above average population (10%). No chemoprevention with antiestrogen agent is recommended.  -However due to her high breast density, multiple previous biopsy, I think is reasonable to consider screening breast MRI. She is interested. -She has been clinically doing very well in the past year. She has reduced her medications, retired from work and more active. Her breast pain and abdominal pain has resolved. Her 11/2018 Mammogram was normal. Her breast exam today was unremarkable.  -She notes another cousin was recently diagnosed with stage IV  colon cancer.  -She will continue close breast cancer screening. Breast MRI in 03/2019. Next mammogram in 11/2019 -She will continue breast exams, labs, image screenings and healthy lifestyle and watching her weight.  -F/u as needed in the future    2. Genetics -Positive heterozygous pathogenic gene mutation calledRAD50 [id number]).    3. Other Cancer screening  -Her last colonoscopy was 09/2012. She will repeat in 5-10 years. She had abdominal pain in 09/2017 but was found to have diverticulitis by CT scan. Pain has not recurrence. -She has had a complete hysterectomy in the past so she does not f/u with a Gyn. She will continue to f/u with her PCP   Plan: -She is clinically doing well.  -F/u as needed in the future, she will f/u with PCP for breast cancer screening.    No problem-specific  Assessment & Plan notes found for this encounter.   Orders Placed This Encounter  Procedures  . MR BREAST BILATERAL W WO CONTRAST INC CAD    Standing Status:   Future    Standing Expiration Date:   02/21/2020    Order Specific Question:   If indicated for the ordered procedure, I authorize the administration of contrast media per Radiology protocol    Answer:   Yes    Order Specific Question:   What is the patient's sedation requirement?    Answer:   No Sedation    Order Specific Question:   Does the patient have a pacemaker or implanted devices?    Answer:   No    Order Specific Question:   Radiology Contrast Protocol - do NOT remove file path    Answer:   [email or web address]    Order Specific Question:   Preferred imaging location?    Answer:   [facility] (table limit-550lbs)   All questions were answered. The patient knows to call the clinic with any problems, questions or concerns. No barriers to learning was detected. I spent 15 minutes counseling the patient face to face. The total time spent in the appointment was 20 minutes and more than 50% was on counseling and review of test results     Truitt Merle, MD 12/22/2018   I, Joslyn Devon, am acting as scribe for Truitt Merle, MD.   I have reviewed the above documentation for accuracy and completeness, and I agree with the above.

## 2018-12-22 ENCOUNTER — Other Ambulatory Visit: Payer: Self-pay

## 2018-12-22 ENCOUNTER — Inpatient Hospital Stay: Payer: BC Managed Care – PPO | Attending: Hematology | Admitting: Hematology

## 2018-12-22 VITALS — BP 119/67 | HR 72 | Temp 98.3°F | Resp 18 | Ht 63.0 in | Wt 200.7 lb

## 2018-12-22 DIAGNOSIS — Z1501 Genetic susceptibility to malignant neoplasm of breast: Secondary | ICD-10-CM | POA: Insufficient documentation

## 2018-12-22 DIAGNOSIS — Z808 Family history of malignant neoplasm of other organs or systems: Secondary | ICD-10-CM | POA: Diagnosis not present

## 2018-12-22 DIAGNOSIS — Z803 Family history of malignant neoplasm of breast: Secondary | ICD-10-CM | POA: Diagnosis not present

## 2018-12-23 ENCOUNTER — Encounter: Payer: Self-pay | Admitting: Hematology

## 2018-12-25 ENCOUNTER — Telehealth: Payer: Self-pay | Admitting: Hematology

## 2018-12-25 NOTE — Telephone Encounter (Signed)
Per 8/28 los. F/u as needed.

## 2018-12-26 ENCOUNTER — Encounter: Payer: Self-pay | Admitting: Internal Medicine

## 2018-12-26 ENCOUNTER — Other Ambulatory Visit: Payer: Self-pay

## 2018-12-26 ENCOUNTER — Ambulatory Visit (INDEPENDENT_AMBULATORY_CARE_PROVIDER_SITE_OTHER): Payer: BC Managed Care – PPO | Admitting: Internal Medicine

## 2018-12-26 VITALS — BP 118/72 | Ht 63.0 in | Wt 200.0 lb

## 2018-12-26 DIAGNOSIS — E1121 Type 2 diabetes mellitus with diabetic nephropathy: Secondary | ICD-10-CM | POA: Diagnosis not present

## 2018-12-26 DIAGNOSIS — Z9189 Other specified personal risk factors, not elsewhere classified: Secondary | ICD-10-CM | POA: Diagnosis not present

## 2018-12-26 DIAGNOSIS — I10 Essential (primary) hypertension: Secondary | ICD-10-CM | POA: Diagnosis not present

## 2018-12-26 DIAGNOSIS — E669 Obesity, unspecified: Secondary | ICD-10-CM

## 2018-12-26 NOTE — Progress Notes (Signed)
Virtual Visit via [email or web address]  This visit type was conducted due to national recommendations for restrictions regarding the COVID-19 pandemic (e.g. social distancing).  This format is felt to be most appropriate for this patient at this time.  All issues noted in this document were discussed and addressed.  No physical exam was performed (except for noted visual exam findings with Video Visits).   I connected with@ on 12/26/18 at  4:00 PM EDT by a video enabled telemedicine application  and verified that I am speaking with the correct person using two identifiers. Location patient: home Location provider: work or home office Persons participating in the virtual visit: patient, provider  I discussed the limitations, risks, security and privacy concerns of performing an evaluation and management service by telephone and the availability of in person appointments. I also discussed with the patient that there may be a patient responsible charge related to this service. The patient expressed understanding and agreed to proceed.  Reason for visit: follow up on hypertension type 2 DM and obesity    HPI:  58 yr old female with above conditions presents for follow up.  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  Feels the best she has in years,  Eating better , exercising regularly and losing weight.    sHe feels generally well, is exercising several times per week and checking blood sugars once daily at variable times.  BS have been under 130 fasting and < 150 post prandially.  Denies any recent hypoglyemic events.   Following a carbohydrate modified diet 6 days per week. Denies numbness, burning and tingling of extremities. Appetite is good.    Hypertension: patient checks blood pressure twice weekly at home.  Readings have been for the most part <130/80 at rest . Patient is following a reduced salt diet most days and is taking medications as prescribed in the evening,  Has been experiencing  nocturia but it is improving   Mammogram normal 8/26   ROS: Patient denies headache, fevers, malaise, unintentional weight loss, skin rash, eye pain, sinus congestion and sinus pain, sore throat, dysphagia,  hemoptysis , cough, dyspnea, wheezing, chest pain, palpitations, orthopnea, edema, abdominal pain, nausea, melena, diarrhea, constipation, flank pain, dysuria, hematuria, urinary  Frequency, nocturia, numbness, tingling, seizures,  Focal weakness, Loss of consciousness,  Tremor, insomnia, depression, anxiety, and suicidal ideation.      Past Medical History:  Diagnosis Date  . Allergy   . Diverticulitis large intestine w/o perforation or abscess w/o bleeding 10/20/2012  . Family history of brain cancer   . Family history of breast cancer in female   . Family history of colon cancer   . Hyperlipidemia   . Hypertension     Past Surgical History:  Procedure Laterality Date  . ABDOMINAL HYSTERECTOMY  2005   endometriosis and adenomyosis, Dr. Rayford Halsted  . BREAST BIOPSY Right 2001   CORE W/CLIP - NEG  . BREAST CYST ASPIRATION Right 2001   NEG  . BREAST SURGERY  2000   cyst 2000; right; benign  . TONSILLECTOMY  1968    Family History  Problem Relation Age of Onset  . Heart disease Father 81       massive MI  . Heart disease Paternal Uncle   . Heart disease Paternal Grandmother   . Heart disease Paternal Grandfather   . Diabetes Sister   . Hypertension Sister   . Diabetes Brother   . Hypertension Brother   . Cancer Brother 63  female breast cancer, spreading to lung, non-smoker  . COPD Maternal Uncle        d. 46  . Brain cancer Paternal Aunt   . Polycythemia Paternal Aunt   . Colon cancer Paternal Aunt   . Polycythemia Paternal Aunt   . Heart disease Paternal Aunt   . Polycythemia Paternal Aunt   . Stomach cancer Neg Hx   . Breast cancer Neg Hx     SOCIAL HX:  reports that she has never smoked. She has never used smokeless tobacco. She reports current alcohol use.  She reports that she does not use drugs.   Current Outpatient Medications:  .  Ascorbic Acid (VITAMIN C) 1000 MG tablet, Take 1,000 mg by mouth daily., Disp: , Rfl:  .  aspirin 81 MG tablet, Take 81 mg by mouth daily.  , Disp: , Rfl:  .  cholecalciferol (VITAMIN D3) 25 MCG (1000 UT) tablet, Take 1,000 Units by mouth daily., Disp: , Rfl:  .  Lactobacillus (PROBIOTIC ACIDOPHILUS PO), Take 1 capsule by mouth daily., Disp: , Rfl:  .  telmisartan-hydrochlorothiazide (MICARDIS HCT) 40-12.5 MG tablet, Take 1 tablet by mouth at bedtime., Disp: 90 tablet, Rfl: 1  EXAM:  VITALS per patient if applicable:  GENERAL: alert, oriented, appears well and in no acute distress  HEENT: atraumatic, conjunttiva clear, no obvious abnormalities on inspection of external nose and ears  NECK: normal movements of the head and neck  LUNGS: on inspection no signs of respiratory distress, breathing rate appears normal, no obvious gross SOB, gasping or wheezing  CV: no obvious cyanosis  MS: moves all visible extremities without noticeable abnormality  PSYCH/NEURO: pleasant and cooperative, no obvious depression or anxiety, speech and thought processing grossly intact  ASSESSMENT AND PLAN:  Essential hypertension Well controlled on current regimen. Offered to separate telmisartan and hctz but she has declined for now.  Renal function stable, no changes today.  Lab Results  Component Value Date   CREATININE 0.92 04/07/2018   Lab Results  Component Value Date   NA 137 04/07/2018   K 4.2 04/07/2018   CL 107 04/07/2018   CO2 22 04/07/2018     Type II diabetes mellitus with nephropathy ([place]) Historically diet controlled.   Reminded to have her annual eye exams,  Return for fasting labs .  Up to date on  pneumovax vaccine , needs flu vaccine.    Her proteinuria has been addressed with addition of ARB Lab Results  Component Value Date   MICROALBUR 44.0 (H) 10/07/2017   Lab Results  Component Value Date    HGBA1C 6.1 (H) 04/07/2018     At high risk for breast cancer Semi Annual screening with mammogram alternating with breast MRI every 6 months has been advised by oncologist  .    I discussed the assessment and treatment plan with the patient. The patient was provided an opportunity to ask questions and all were answered. The patient agreed with the plan and demonstrated an understanding of the instructions.   The patient was advised to call back or seek an in-person evaluation if the symptoms worsen or if the condition fails to improve as anticipated.  A total of 30 minutes was spent with patient more than half of which was spent in counseling patient on management of hypertension, diabetes and obesity management ,  Reviewing her oncologist;'s recommendations,   ordering past and future  labs and imaging studies done, and coordination of care.    Crecencio Mc,  MD

## 2018-12-27 ENCOUNTER — Encounter: Payer: Self-pay | Admitting: Internal Medicine

## 2018-12-27 DIAGNOSIS — Z9189 Other specified personal risk factors, not elsewhere classified: Secondary | ICD-10-CM | POA: Insufficient documentation

## 2018-12-27 NOTE — Assessment & Plan Note (Signed)
Historically diet controlled.   Reminded to have her annual eye exams,  Return for fasting labs .  Up to date on  pneumovax vaccine , needs flu vaccine.    Her proteinuria has been addressed with addition of ARB Lab Results  Component Value Date   MICROALBUR 44.0 (H) 10/07/2017   Lab Results  Component Value Date   HGBA1C 6.1 (H) 04/07/2018

## 2018-12-27 NOTE — Assessment & Plan Note (Addendum)
Semi Annual screening with mammogram alternating with breast MRI every 6 months has been advised by oncologist  .

## 2018-12-27 NOTE — Assessment & Plan Note (Addendum)
Well controlled on current regimen. Offered to separate telmisartan and hctz but she has declined for now.  Renal function stable, no changes today.  Lab Results  Component Value Date   CREATININE 0.92 04/07/2018   Lab Results  Component Value Date   NA 137 04/07/2018   K 4.2 04/07/2018   CL 107 04/07/2018   CO2 22 04/07/2018

## 2018-12-28 ENCOUNTER — Other Ambulatory Visit (INDEPENDENT_AMBULATORY_CARE_PROVIDER_SITE_OTHER): Payer: BC Managed Care – PPO

## 2018-12-28 ENCOUNTER — Other Ambulatory Visit: Payer: Self-pay

## 2018-12-28 DIAGNOSIS — Z23 Encounter for immunization: Secondary | ICD-10-CM

## 2018-12-28 DIAGNOSIS — E1121 Type 2 diabetes mellitus with diabetic nephropathy: Secondary | ICD-10-CM

## 2018-12-28 DIAGNOSIS — I10 Essential (primary) hypertension: Secondary | ICD-10-CM

## 2018-12-28 DIAGNOSIS — E669 Obesity, unspecified: Secondary | ICD-10-CM | POA: Diagnosis not present

## 2018-12-28 LAB — COMPREHENSIVE METABOLIC PANEL
ALT: 15 U/L (ref 0–35)
AST: 15 U/L (ref 0–37)
Albumin: 4.4 g/dL (ref 3.5–5.2)
Alkaline Phosphatase: 95 U/L (ref 39–117)
BUN: 24 mg/dL — ABNORMAL HIGH (ref 6–23)
CO2: 25 mEq/L (ref 19–32)
Calcium: 9.8 mg/dL (ref 8.4–10.5)
Chloride: 102 mEq/L (ref 96–112)
Creatinine, Ser: 0.79 mg/dL (ref 0.40–1.20)
GFR: 74.64 mL/min (ref >60.00)
Glucose, Bld: 101 mg/dL — ABNORMAL HIGH (ref 70–99)
Potassium: 4.4 mEq/L (ref 3.5–5.1)
Sodium: 137 mEq/L (ref 135–145)
Total Bilirubin: 0.4 mg/dL (ref 0.2–1.2)
Total Protein: 6.8 g/dL (ref 6.0–8.3)

## 2018-12-28 LAB — LIPID PANEL
Cholesterol: 199 mg/dL (ref 0–200)
HDL: 47.4 mg/dL (ref >39.00)
NonHDL: 151.91
Total CHOL/HDL Ratio: 4
Triglycerides: 217 mg/dL — ABNORMAL HIGH (ref 0.0–149.0)
VLDL: 43.4 mg/dL — ABNORMAL HIGH (ref 0.0–40.0)

## 2018-12-28 LAB — MICROALBUMIN / CREATININE URINE RATIO
Creatinine,U: 46.7 mg/dL
Microalb Creat Ratio: 2.3 mg/g (ref 0.0–30.0)
Microalb, Ur: 1.1 mg/dL (ref 0.0–1.9)

## 2018-12-28 LAB — HEMOGLOBIN A1C: Hgb A1c MFr Bld: 6.2 % (ref 4.6–6.5)

## 2018-12-28 LAB — TSH: TSH: 2.79 u[IU]/mL (ref 0.35–4.50)

## 2018-12-28 LAB — LDL CHOLESTEROL, DIRECT: Direct LDL: 124 mg/dL

## 2019-03-07 ENCOUNTER — Other Ambulatory Visit: Payer: Self-pay | Admitting: Internal Medicine

## 2019-05-01 ENCOUNTER — Other Ambulatory Visit: Payer: BC Managed Care – PPO

## 2019-05-21 LAB — HM DIABETES EYE EXAM

## 2019-06-27 ENCOUNTER — Other Ambulatory Visit: Payer: Self-pay | Admitting: Hematology

## 2019-07-02 ENCOUNTER — Other Ambulatory Visit: Payer: BC Managed Care – PPO

## 2019-10-31 ENCOUNTER — Telehealth: Payer: Self-pay | Admitting: Internal Medicine

## 2019-10-31 DIAGNOSIS — Z0000 Encounter for general adult medical examination without abnormal findings: Secondary | ICD-10-CM

## 2019-10-31 DIAGNOSIS — E1121 Type 2 diabetes mellitus with diabetic nephropathy: Secondary | ICD-10-CM

## 2019-10-31 DIAGNOSIS — I10 Essential (primary) hypertension: Secondary | ICD-10-CM

## 2019-10-31 NOTE — Telephone Encounter (Signed)
Pt would like lab work scheduled the week before her physical in August.

## 2019-11-01 NOTE — Telephone Encounter (Signed)
Pt would like to have labs done prior to physical in august. I have ordered TSH, CMP, A1c, CBC, lipid and micro. Is there anything else that needs to be ordered?

## 2019-12-03 ENCOUNTER — Other Ambulatory Visit: Payer: Self-pay | Admitting: Internal Medicine

## 2019-12-03 MED ORDER — TELMISARTAN-HCTZ 40-12.5 MG PO TABS: 1.0000 | ORAL_TABLET | Freq: Every day | ORAL | 0 refills | Status: DC

## 2019-12-05 ENCOUNTER — Other Ambulatory Visit: Payer: Self-pay | Admitting: Hematology

## 2019-12-05 ENCOUNTER — Ambulatory Visit
Admission: RE | Admit: 2019-12-05 | Discharge: 2019-12-05 | Disposition: A | Discharge status: Home / Self Care | Payer: BC Managed Care – PPO | Source: Ambulatory Visit | Attending: Hematology | Admitting: Hematology

## 2019-12-05 ENCOUNTER — Other Ambulatory Visit: Payer: Self-pay

## 2019-12-05 DIAGNOSIS — Z803 Family history of malignant neoplasm of breast: Secondary | ICD-10-CM

## 2019-12-05 MED ORDER — GADOBUTROL 1 MMOL/ML IV SOLN
10.0000 mL | Freq: Once | INTRAVENOUS | Status: AC | PRN
  Administered 2019-12-05: 10 mL via INTRAVENOUS

## 2019-12-18 ENCOUNTER — Other Ambulatory Visit: Payer: Self-pay

## 2019-12-18 ENCOUNTER — Other Ambulatory Visit (INDEPENDENT_AMBULATORY_CARE_PROVIDER_SITE_OTHER): Payer: BC Managed Care – PPO

## 2019-12-18 DIAGNOSIS — E1121 Type 2 diabetes mellitus with diabetic nephropathy: Secondary | ICD-10-CM | POA: Diagnosis not present

## 2019-12-18 DIAGNOSIS — I10 Essential (primary) hypertension: Secondary | ICD-10-CM

## 2019-12-18 DIAGNOSIS — Z0000 Encounter for general adult medical examination without abnormal findings: Secondary | ICD-10-CM | POA: Diagnosis not present

## 2019-12-18 LAB — CBC WITH DIFFERENTIAL/PLATELET
Basophils Absolute: 0 10*3/uL (ref 0.0–0.1)
Basophils Relative: 0.7 % (ref 0.0–3.0)
Eosinophils Absolute: 0.1 10*3/uL (ref 0.0–0.7)
Eosinophils Relative: 1 % (ref 0.0–5.0)
HCT: 39.1 % (ref 36.0–46.0)
Hemoglobin: 13 g/dL (ref 12.0–15.0)
Lymphocytes Relative: 33.7 % (ref 12.0–46.0)
Lymphs Abs: 1.9 10*3/uL (ref 0.7–4.0)
MCHC: 33.1 g/dL (ref 30.0–36.0)
MCV: 85.7 fl (ref 78.0–100.0)
Monocytes Absolute: 0.5 10*3/uL (ref 0.1–1.0)
Monocytes Relative: 8 % (ref 3.0–12.0)
Neutro Abs: 3.2 10*3/uL (ref 1.4–7.7)
Neutrophils Relative %: 56.6 % (ref 43.0–77.0)
Platelets: 181 10*3/uL (ref 150.0–400.0)
RBC: 4.57 Mil/uL (ref 3.87–5.11)
RDW: 13.2 % (ref 11.5–15.5)
WBC: 5.7 10*3/uL (ref 4.0–10.5)

## 2019-12-18 LAB — COMPREHENSIVE METABOLIC PANEL
ALT: 16 U/L (ref 0–35)
AST: 15 U/L (ref 0–37)
Albumin: 4.2 g/dL (ref 3.5–5.2)
Alkaline Phosphatase: 86 U/L (ref 39–117)
BUN: 19 mg/dL (ref 6–23)
CO2: 24 mEq/L (ref 19–32)
Calcium: 9.7 mg/dL (ref 8.4–10.5)
Chloride: 103 mEq/L (ref 96–112)
Creatinine, Ser: 0.92 mg/dL (ref 0.40–1.20)
GFR: 62.4 mL/min (ref >60.00)
Glucose, Bld: 112 mg/dL — ABNORMAL HIGH (ref 70–99)
Potassium: 4 mEq/L (ref 3.5–5.1)
Sodium: 137 mEq/L (ref 135–145)
Total Bilirubin: 0.4 mg/dL (ref 0.2–1.2)
Total Protein: 6.5 g/dL (ref 6.0–8.3)

## 2019-12-18 LAB — MICROALBUMIN / CREATININE URINE RATIO
Creatinine,U: 18.6 mg/dL
Microalb Creat Ratio: 3.8 mg/g (ref 0.0–30.0)
Microalb, Ur: <0.7 mg/dL (ref 0.0–1.9)

## 2019-12-18 LAB — LIPID PANEL
Cholesterol: 172 mg/dL (ref 0–200)
HDL: 47.7 mg/dL (ref >39.00)
LDL Cholesterol: 98 mg/dL (ref 0–99)
NonHDL: 124.24
Total CHOL/HDL Ratio: 4
Triglycerides: 130 mg/dL (ref 0.0–149.0)
VLDL: 26 mg/dL (ref 0.0–40.0)

## 2019-12-18 LAB — HEMOGLOBIN A1C: Hgb A1c MFr Bld: 6.3 % (ref 4.6–6.5)

## 2019-12-18 LAB — TSH: TSH: 3.4 u[IU]/mL (ref 0.35–4.50)

## 2019-12-20 ENCOUNTER — Ambulatory Visit (INDEPENDENT_AMBULATORY_CARE_PROVIDER_SITE_OTHER): Payer: BC Managed Care – PPO | Admitting: Internal Medicine

## 2019-12-20 ENCOUNTER — Encounter: Payer: Self-pay | Admitting: Internal Medicine

## 2019-12-20 ENCOUNTER — Other Ambulatory Visit: Payer: Self-pay

## 2019-12-20 VITALS — BP 118/88 | HR 78 | Temp 97.7°F | Resp 15 | Ht 63.0 in | Wt 201.0 lb

## 2019-12-20 DIAGNOSIS — E1121 Type 2 diabetes mellitus with diabetic nephropathy: Secondary | ICD-10-CM | POA: Diagnosis not present

## 2019-12-20 DIAGNOSIS — E1169 Type 2 diabetes mellitus with other specified complication: Secondary | ICD-10-CM

## 2019-12-20 DIAGNOSIS — Z9189 Other specified personal risk factors, not elsewhere classified: Secondary | ICD-10-CM

## 2019-12-20 DIAGNOSIS — E785 Hyperlipidemia, unspecified: Secondary | ICD-10-CM

## 2019-12-20 DIAGNOSIS — I10 Essential (primary) hypertension: Secondary | ICD-10-CM | POA: Diagnosis not present

## 2019-12-20 DIAGNOSIS — Z0000 Encounter for general adult medical examination without abnormal findings: Secondary | ICD-10-CM

## 2019-12-20 DIAGNOSIS — Z23 Encounter for immunization: Secondary | ICD-10-CM | POA: Diagnosis not present

## 2019-12-20 DIAGNOSIS — E669 Obesity, unspecified: Secondary | ICD-10-CM | POA: Diagnosis not present

## 2019-12-20 NOTE — Patient Instructions (Addendum)
This is  my  example of a  "Low GI"  Diet:  It will allow you to lose 4 to 8  lbs  per month if you follow it carefully.  Your goal with exercise is a minimum of 30 minutes of aerobic exercise 5 days per week (Walking does not count once it becomes easy!)   All of the foods can be found at grocery stores and in bulk at [employer].  The Atkins protein bars and shakes are available in more varieties at [employer], [employer] and [place].     7 AM Breakfast:  Choose from the following:  Low carbohydrate Protein  Shakes (I recommend the  Premier Protein chocolate shakes,  EAS AdvantEdge "Carb Control" shakes  Or the Atkins shakes all are under 3 net carbs)     a scrambled egg/bacon/cheese burrito made with Mission's "carb balance" whole wheat tortilla  (about 10 net carbs )  Regulatory affairs officer (basically a quiche without the pastry crust) that is eaten cold and very convenient way to get your eggs.  8 carbs)  If you make your own protein shakes, avoid bananas and pineapple,  And use low carb greek yogurt or original /unsweetened almond or soy milk    Avoid cereal and bananas, oatmeal and cream of wheat and grits. They are loaded with carbohydrates!   10 AM: high protein snack:  Protein bar by Atkins (the snack size, under 200 cal, usually < 6 net carbs).    A stick of cheese:  Around 1 carb,  100 cal     Dannon Light n Fit [place] Yogurt  (80 cal, 8 carbs)  Other so called "protein bars" and Greek yogurts tend to be loaded with carbohydrates.  Remember, in food advertising, the word "energy" is synonymous for " carbohydrate."  Lunch:   A Sandwich using the bread choices listed, Can use any  Eggs,  lunchmeat, grilled meat or canned tuna), avocado, regular mayo/mustard  and cheese.  A Salad using blue cheese, ranch,  Goddess or vinagrette,  Avoid taco shells, croutons or "confetti" and no "candied nuts" but regular nuts OK.   No pretzels, nabs  or chips.  Pickles and miniature sweet  peppers are a good low carb alternative that provide a "crunch"  The bread is the only source of carbohydrate in a sandwich and  can be decreased by trying some of the attached alternatives to traditional loaf bread   Avoid "Low fat dressings, as well as [place] dressings They are loaded with sugar!   3 PM/ Mid day  Snack:  Consider  1 ounce of  almonds, walnuts, pistachios, pecans, peanuts,  Macadamia nuts or a nut medley.  Avoid "granola and granola bars "  Mixed nuts are ok in moderation as long as there are no raisins,  cranberries or dried fruit.   KIND bars are OK if you get the low glycemic index variety   Try the prosciutto/mozzarella cheese sticks by [employer]  In deli /backery section   High protein      6 PM  Dinner:     Meat/fowl/fish with a green salad, and either broccoli, cauliflower, green beans, spinach, brussel sprouts or  Lima beans. DO NOT BREAD THE PROTEIN!!      There is a low carb pasta by Dreamfield's that is acceptable and tastes great: only 5 digestible carbs/serving.( All grocery stores but BJs carry it ) Several ready made meals are available low  carb:   Try Michel Angelo's chicken piccata or chicken or eggplant parm over low carb pasta.(Lowes and BJs)   Marjory Lies Sanchez's "Carnitas" (pulled pork, no sauce,  0 carbs) or his beef pot roast to make a dinner burrito (at [employer]J's)  Pesto over low carb pasta (bj's sells a good quality pesto in the center refrigerated section of the deli   Try satueeing  Cheral Marker with mushroooms as a good side   [employer] Giant makes a mashed cauliflower that tastes like mashed potatoes  Whole wheat pasta is still full of digestible carbs and  Not as low in glycemic index as Dreamfield's.   Brown rice is still rice,  So skip the rice and noodles if you eat [place] or [place] (or at least limit to 1/2 cup)  9 PM snack :   Breyer's "low carb" fudgsicle or  ice cream bar (Carb Smart line), or  Weight Watcher's ice cream bar , or  another "no sugar added" ice cream;  a serving of fresh berries/cherries with whipped cream   Cheese or DANNON'S LlGHT N FIT GREEK YOGURT  8 ounces of [place] unsweetened almond/cococunut milk    Treat yourself to a parfait made with whipped cream blueberiies, walnuts and vanilla greek yogurt  Avoid bananas, pineapple, grapes  and watermelon on a regular basis because they are high in sugar.  THINK OF THEM AS DESSERT  Remember that snack Substitutions should be less than 10 NET carbs per serving and meals < 20 carbs. Remember to subtract fiber grams to get the "net carbs."

## 2019-12-20 NOTE — Progress Notes (Addendum)
Patient ID: Nichole Jensen, female    DOB: [birth year]  Age: 59 y.o. MRN: [id number]  The patient is here for annual PREVENTIVE examination and management of other chronic and acute problems.  This visit occurred during the SARS-CoV-2 public health emergency.  Safety protocols were in place, including screening questions prior to the visit, additional usage of staff PPE, and extensive cleaning of exam room while observing appropriate contact time as indicated for disinfecting solutions.    Patient has received both doses of the available COVID 19 vaccine without complications.  Patient continues to mask when outside of the home except when walking in yard or at safe distances from others .  Patient denies any change in mood or development of unhealthy behaviors resuting from the pandemic's restriction of activities and socialization.      The risk factors are reflected in the social history.  The roster of all physicians providing medical care to patient - is listed in the Snapshot section of the chart.  Activities of daily living:  The patient is 100% independent in all ADLs: dressing, toileting, feeding as well as independent mobility  Home safety : The patient has smoke detectors in the home. They wear seatbelts.  There are no firearms at home. There is no violence in the home.   There is no risks for hepatitis, STDs or HIV. There is no   history of blood transfusion. They have no travel history to infectious disease endemic areas of the world.  The patient has seen their dentist in the last six month. They have seen their eye doctor in the last year. She denies  hearing difficulty with regard to whispered voices and some television programs.   She does not  have excessive sun exposure. Discussed the need for sun protection: hats, long sleeves and use of sunscreen if there is significant sun exposure.   Diet: the importance of a healthy diet is discussed. She has a healthy diet.  She  initially lost 12 lbs elliminating dairy and bread but regained it.  Has hired a Clinical research associate and resumed low glycemic index diet.   The benefits of regular aerobic exercise were discussed. She has been walking  daily since retiring last year.  .   Depression screen: there are no signs or vegative symptoms of depression- irritability, change in appetite, anhedonia, sadness/tearfullness.  The following portions of the patient's history were reviewed and updated as appropriate: allergies, current medications, past family history, past medical history,  past surgical history, past social history  and problem list.  Visual acuity was not assessed per patient preference since she has regular follow up with her ophthalmologist. Hearing and body mass index were assessed and reviewed.   During the course of the visit the patient was educated and counseled about appropriate screening and preventive services including : fall prevention , diabetes screening, nutrition counseling, colorectal cancer screening, and recommended immunizations.    CC: The primary encounter diagnosis was Visit for preventive health examination. Diagnoses of Need for immunization against influenza, At high risk for breast cancer, Essential hypertension, Type II diabetes mellitus with nephropathy ([place]), Obesity (BMI 30-39.9), and Hyperlipidemia associated with type 2 diabetes mellitus ([place]) were also pertinent to this visit.  1) Obesity:  Actively working to reduce BMI.   2) HTN:  Patient is taking her medications as prescribed and notes no adverse effects.  Home BP readings have been done about once per week and are  generally < 130/80 .  She is  avoiding added salt in her diet and walking regularly about 3 times per week for exercise     History Nichole Jensen has a past medical history of Allergy, Diverticulitis large intestine w/o perforation or abscess w/o bleeding (10/20/2012), Family history of brain cancer, Family history of breast  cancer in female, Family history of colon cancer, Hyperlipidemia, and Hypertension.   She has a past surgical history that includes Breast surgery (2000); Tonsillectomy (1968); Abdominal hysterectomy (2005); Breast biopsy (Right, 2001); and Breast cyst aspiration (Right, 2001).   Her family history includes Brain cancer in her paternal aunt; COPD in her maternal uncle; Cancer (age of onset: 62) in her brother; Colon cancer in her paternal aunt; Diabetes in her brother and sister; Heart disease in her paternal aunt, paternal grandfather, paternal grandmother, and paternal uncle; Heart disease (age of onset: 76) in her father; Hypertension in her brother and sister; Polycythemia in her paternal aunt, paternal aunt, and paternal aunt.She reports that she has never smoked. She has never used smokeless tobacco. She reports current alcohol use. She reports that she does not use drugs.  Outpatient Medications Prior to Visit  Medication Sig Dispense Refill  . aspirin 81 MG tablet Take 81 mg by mouth daily.      . Lactobacillus (PROBIOTIC ACIDOPHILUS PO) Take 1 capsule by mouth daily.    Marland Kitchen telmisartan-hydrochlorothiazide (MICARDIS HCT) 40-12.5 MG tablet Take 1 tablet by mouth daily. 90 tablet 0  . Ascorbic Acid (VITAMIN C) 1000 MG tablet Take 1,000 mg by mouth daily. (Patient not taking: Reported on 12/20/2019)    . cholecalciferol (VITAMIN D3) 25 MCG (1000 UT) tablet Take 1,000 Units by mouth daily. (Patient not taking: Reported on 12/20/2019)     No facility-administered medications prior to visit.    Review of Systems  Patient denies headache, fevers, malaise, unintentional weight loss, skin rash, eye pain, sinus congestion and sinus pain, sore throat, dysphagia,  hemoptysis , cough, dyspnea, wheezing, chest pain, palpitations, orthopnea, edema, abdominal pain, nausea, melena, diarrhea, constipation, flank pain, dysuria, hematuria, urinary  Frequency, nocturia, numbness, tingling, seizures,  Focal weakness,  Loss of consciousness,  Tremor, insomnia, depression, anxiety, and suicidal ideation.     Objective:  BP 118/88 (BP Location: Left Arm, Patient Position: Sitting, Cuff Size: Large)   Pulse 78   Temp 97.7 F (36.5 C) (Oral)   Resp 15   Ht 5\' 3"  (1.6 m)   Wt 201 lb (91.2 kg)   SpO2 98%   BMI 35.61 kg/m   Physical Exam  General appearance: alert, cooperative and appears stated age Ears: normal TM's and external ear canals both ears Throat: lips, mucosa, and tongue normal; teeth and gums normal Neck: no adenopathy, no carotid bruit, supple, symmetrical, trachea midline and thyroid not enlarged, symmetric, no tenderness/mass/nodules Back: symmetric, no curvature. ROM normal. No CVA tenderness. Lungs: clear to auscultation bilaterally Heart: regular rate and rhythm, S1, S2 normal, no murmur, click, rub or gallop Abdomen: soft, non-tender; bowel sounds normal; no masses,  no organomegaly Pulses: 2+ and symmetric Skin: Skin color, texture, turgor normal. No rashes or lesions Lymph nodes: Cervical, supraclavicular, and axillary nodes normal.  Assessment & Plan:   Problem List Items Addressed This Visit      Unprioritized   At high risk for breast cancer    She has continued screening with breast Mri alternating with 3d mammogram annually.       Essential hypertension    Well controlled on current regimen. Renal function stable, no changes  today.  Lab Results  Component Value Date   CREATININE 0.92 12/18/2019   Lab Results  Component Value Date   NA 137 12/18/2019   K 4.0 12/18/2019   CL 103 12/18/2019   CO2 24 12/18/2019         Hyperlipidemia associated with type 2 diabetes mellitus ([place])    Based on current lipid profile, the risk of clinically significant CAD is 12% over the next 10 years, using the Framingham risk calculator. The [employer] of Card[employer] recommends starting patients aged 37 or higher on moderate intensity statin therapy for LDL between 70-189  and 10 yr risk of CAD > 7.5% ; she has deferred statin therapy for now.   Lab Results  Component Value Date   CHOL 172 12/18/2019   HDL 47.70 12/18/2019   LDLCALC 98 12/18/2019   LDLDIRECT 124.0 12/28/2018   TRIG 130.0 12/18/2019   CHOLHDL 4 12/18/2019         Obesity (BMI 30-39.9)    I have encouraged her to reduce her  BMI and encouraged  Continued weight loss with goal of 10% of body weight over the next 6 months using a low glycemic index diet and regular exercise a minimum of 5 days per week.        Type II diabetes mellitus with nephropathy ([facility])    Remains diet controlled.   Reminded to have her annual eye exams,  Return for fasting labs .  Up to date on  pneumovax vaccine , needs flu vaccine.    Her proteinuria has been addressed with addition of ARB Lab Results  Component Value Date   MICROALBUR <0.7 12/18/2019   Lab Results  Component Value Date   HGBA1C 6.3 12/18/2019         Visit for preventive health examination - Primary    age appropriate education and counseling updated, referrals for preventative services and immunizations addressed, dietary and smoking counseling addressed, most recent labs reviewed.  I have personally reviewed and have noted:  1) the patient's medical and social history 2) The pt's use of alcohol, tobacco, and illicit drugs 3) The patient's current medications and supplements 4) Functional ability including ADL's, fall risk, home safety risk, hearing and visual impairment 5) Diet and physical activities 6) Evidence for depression or mood disorder 7) The patient's height, weight, and BMI have been recorded in the chart  I have made referrals, and provided counseling and education based on review of the above.       Other Visit Diagnoses    Need for immunization against influenza       Relevant Orders   Flu Vaccine QUAD 36+ mos IM (Completed)      I have discontinued Arville Lime Rita's cholecalciferol. I am also having her  maintain her aspirin, vitamin C, Lactobacillus (PROBIOTIC ACIDOPHILUS PO), and telmisartan-hydrochlorothiazide.  No orders of the defined types were placed in this encounter.   Medications Discontinued During This Encounter  Medication Reason  . cholecalciferol (VITAMIN D3) 25 MCG (1000 UT) tablet     Follow-up: Return in about 6 months (around 06/21/2020) for follow up diabetes.   Crecencio Mc, MD

## 2019-12-22 ENCOUNTER — Encounter: Payer: Self-pay | Admitting: Internal Medicine

## 2019-12-22 DIAGNOSIS — Z1211 Encounter for screening for malignant neoplasm of colon: Secondary | ICD-10-CM | POA: Insufficient documentation

## 2019-12-22 DIAGNOSIS — Z0000 Encounter for general adult medical examination without abnormal findings: Secondary | ICD-10-CM | POA: Insufficient documentation

## 2019-12-22 DIAGNOSIS — E785 Hyperlipidemia, unspecified: Secondary | ICD-10-CM | POA: Insufficient documentation

## 2019-12-22 DIAGNOSIS — E1169 Type 2 diabetes mellitus with other specified complication: Secondary | ICD-10-CM | POA: Insufficient documentation

## 2019-12-22 NOTE — Assessment & Plan Note (Signed)
Based on current lipid profile, the risk of clinically significant CAD is 12% over the next 10 years, using the Framingham risk calculator. The [employer] recommends starting patients aged 59 or higher on moderate intensity statin therapy for LDL between 70-189 and 10 yr risk of CAD > 7.5% ; she has deferred statin therapy for now.   Lab Results  Component Value Date   CHOL 172 12/18/2019   HDL 47.70 12/18/2019   LDLCALC 98 12/18/2019   LDLDIRECT 124.0 12/28/2018   TRIG 130.0 12/18/2019   CHOLHDL 4 12/18/2019

## 2019-12-22 NOTE — Assessment & Plan Note (Signed)
Well controlled on current regimen. Renal function stable, no changes today.  Lab Results  Component Value Date   CREATININE 0.92 12/18/2019   Lab Results  Component Value Date   NA 137 12/18/2019   K 4.0 12/18/2019   CL 103 12/18/2019   CO2 24 12/18/2019

## 2019-12-22 NOTE — Assessment & Plan Note (Signed)
Remains diet controlled.   Reminded to have her annual eye exams,  Return for fasting labs .  Up to date on  pneumovax vaccine , needs flu vaccine.    Her proteinuria has been addressed with addition of ARB Lab Results  Component Value Date   MICROALBUR <0.7 12/18/2019   Lab Results  Component Value Date   HGBA1C 6.3 12/18/2019

## 2019-12-22 NOTE — Assessment & Plan Note (Signed)
I have encouraged her to reduce her  BMI and encouraged  Continued weight loss with goal of 10% of body weight over the next 6 months using a low glycemic index diet and regular exercise a minimum of 5 days per week.

## 2019-12-22 NOTE — Assessment & Plan Note (Signed)
She has continued screening with breast Mri alternating with 3d mammogram annually.

## 2019-12-22 NOTE — Assessment & Plan Note (Signed)

## 2020-02-08 ENCOUNTER — Telehealth: Payer: Self-pay | Admitting: Genetic Counselor

## 2020-02-08 NOTE — Telephone Encounter (Signed)
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  LVM with contact information requesting a call back. °

## 2020-02-12 NOTE — Telephone Encounter (Signed)
Revealed that patient's RAD50 result has been amended.  Re-review of the available evidence indicates that the RAD50 variant is associated with autosomal recessive RAD50-related conditions, but is not definitively known to cause autosomal dominant RAD50-related conditions.    

## 2020-02-25 ENCOUNTER — Other Ambulatory Visit: Payer: Self-pay

## 2020-02-25 DIAGNOSIS — E785 Hyperlipidemia, unspecified: Secondary | ICD-10-CM

## 2020-02-25 DIAGNOSIS — E1121 Type 2 diabetes mellitus with diabetic nephropathy: Secondary | ICD-10-CM

## 2020-02-25 DIAGNOSIS — R5383 Other fatigue: Secondary | ICD-10-CM

## 2020-02-25 DIAGNOSIS — I10 Essential (primary) hypertension: Secondary | ICD-10-CM

## 2020-02-25 DIAGNOSIS — E1169 Type 2 diabetes mellitus with other specified complication: Secondary | ICD-10-CM

## 2020-02-25 NOTE — Telephone Encounter (Signed)
Patient called and scheduled appointment for 04/28/20 and she needs an order for labs

## 2020-02-26 MED ORDER — TELMISARTAN-HCTZ 40-12.5 MG PO TABS: 1.0000 | ORAL_TABLET | Freq: Every day | ORAL | 0 refills | Status: DC

## 2020-02-27 NOTE — Addendum Note (Signed)
Addended by: Adair Laundry on: 02/27/2020 05:00 PM   Modules accepted: Orders

## 2020-02-27 NOTE — Telephone Encounter (Signed)
Pt would like to have labs done before her appt on 04/28/2020. I have ordered a1c, cmp, lipid panel, cbc, tsh and microalbumin. Is there anything else that needs to be ordered?

## 2020-03-11 NOTE — Progress Notes (Signed)
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  The amended report date is January 23, 2020.

## 2020-04-21 ENCOUNTER — Other Ambulatory Visit (INDEPENDENT_AMBULATORY_CARE_PROVIDER_SITE_OTHER): Payer: BC Managed Care – PPO

## 2020-04-21 ENCOUNTER — Other Ambulatory Visit: Payer: Self-pay

## 2020-04-21 DIAGNOSIS — R5383 Other fatigue: Secondary | ICD-10-CM | POA: Diagnosis not present

## 2020-04-21 DIAGNOSIS — I10 Essential (primary) hypertension: Secondary | ICD-10-CM

## 2020-04-21 DIAGNOSIS — E1169 Type 2 diabetes mellitus with other specified complication: Secondary | ICD-10-CM

## 2020-04-21 DIAGNOSIS — E785 Hyperlipidemia, unspecified: Secondary | ICD-10-CM

## 2020-04-21 DIAGNOSIS — E1121 Type 2 diabetes mellitus with diabetic nephropathy: Secondary | ICD-10-CM

## 2020-04-21 LAB — COMPREHENSIVE METABOLIC PANEL
ALT: 13 U/L (ref 0–35)
AST: 14 U/L (ref 0–37)
Albumin: 4.4 g/dL (ref 3.5–5.2)
Alkaline Phosphatase: 98 U/L (ref 39–117)
BUN: 21 mg/dL (ref 6–23)
CO2: 27 mEq/L (ref 19–32)
Calcium: 9.6 mg/dL (ref 8.4–10.5)
Chloride: 99 mEq/L (ref 96–112)
Creatinine, Ser: 0.97 mg/dL (ref 0.40–1.20)
GFR: 63.86 mL/min (ref >60.00)
Glucose, Bld: 105 mg/dL — ABNORMAL HIGH (ref 70–99)
Potassium: 4.1 mEq/L (ref 3.5–5.1)
Sodium: 136 mEq/L (ref 135–145)
Total Bilirubin: 0.5 mg/dL (ref 0.2–1.2)
Total Protein: 6.8 g/dL (ref 6.0–8.3)

## 2020-04-21 LAB — CBC WITH DIFFERENTIAL/PLATELET
Basophils Absolute: 0 10*3/uL (ref 0.0–0.1)
Basophils Relative: 0.6 % (ref 0.0–3.0)
Eosinophils Absolute: 0.1 10*3/uL (ref 0.0–0.7)
Eosinophils Relative: 0.9 % (ref 0.0–5.0)
HCT: 43.5 % (ref 36.0–46.0)
Hemoglobin: 14.2 g/dL (ref 12.0–15.0)
Lymphocytes Relative: 29.5 % (ref 12.0–46.0)
Lymphs Abs: 2.1 10*3/uL (ref 0.7–4.0)
MCHC: 32.6 g/dL (ref 30.0–36.0)
MCV: 85.3 fl (ref 78.0–100.0)
Monocytes Absolute: 0.5 10*3/uL (ref 0.1–1.0)
Monocytes Relative: 7.2 % (ref 3.0–12.0)
Neutro Abs: 4.3 10*3/uL (ref 1.4–7.7)
Neutrophils Relative %: 61.8 % (ref 43.0–77.0)
Platelets: 194 10*3/uL (ref 150.0–400.0)
RBC: 5.11 Mil/uL (ref 3.87–5.11)
RDW: 13 % (ref 11.5–15.5)
WBC: 7 10*3/uL (ref 4.0–10.5)

## 2020-04-21 LAB — LIPID PANEL
Cholesterol: 210 mg/dL — ABNORMAL HIGH (ref 0–200)
HDL: 47.4 mg/dL (ref >39.00)
LDL Cholesterol: 134 mg/dL — ABNORMAL HIGH (ref 0–99)
NonHDL: 162.84
Total CHOL/HDL Ratio: 4
Triglycerides: 144 mg/dL (ref 0.0–149.0)
VLDL: 28.8 mg/dL (ref 0.0–40.0)

## 2020-04-21 LAB — TSH: TSH: 2.6 u[IU]/mL (ref 0.35–4.50)

## 2020-04-21 LAB — MICROALBUMIN / CREATININE URINE RATIO
Creatinine,U: 64.8 mg/dL
Microalb Creat Ratio: 2.3 mg/g (ref 0.0–30.0)
Microalb, Ur: 1.5 mg/dL (ref 0.0–1.9)

## 2020-04-21 LAB — HEMOGLOBIN A1C: Hgb A1c MFr Bld: 6.3 % (ref 4.6–6.5)

## 2020-04-22 MED ORDER — ATORVASTATIN CALCIUM 20 MG PO TABS: 20.0000 mg | ORAL_TABLET | Freq: Every day | ORAL | 2 refills | Status: DC

## 2020-04-22 NOTE — Addendum Note (Signed)
Addended by: Sherlene Shams on: 04/22/2020 05:20 PM   Modules accepted: Orders

## 2020-04-22 NOTE — Progress Notes (Signed)
Your diabetes remains under excellent control  but your LDL (the "bad" cholesterol) has risen by  40  pts.  I would like you to reconsider  a trial of statin therapy , as statins are now considered standard of care  for ALL patients with type 2 Diabetes Mellitus since they have been proven to stabilize atherosclerotic placque in arteries and  prevent strokes and heart attacks. I am sending a prescription to your pharmacy for atorvastatin, and if you are willing to initiate therapy,  we can start with once or twice weekly if you prefer. We will discuss this more at your upcoming visit   Regards,   Duncan Dull, MD

## 2020-04-22 NOTE — Assessment & Plan Note (Signed)
LDL has risen by 40 pts.  Recommending statin trial

## 2020-04-28 ENCOUNTER — Ambulatory Visit: Payer: BC Managed Care – PPO | Admitting: Internal Medicine

## 2020-04-28 ENCOUNTER — Encounter: Payer: Self-pay | Admitting: Internal Medicine

## 2020-04-28 ENCOUNTER — Other Ambulatory Visit: Payer: Self-pay

## 2020-04-28 VITALS — BP 112/80 | HR 84 | Temp 98.4°F | Resp 14 | Ht 63.0 in | Wt 194.6 lb

## 2020-04-28 DIAGNOSIS — E1169 Type 2 diabetes mellitus with other specified complication: Secondary | ICD-10-CM | POA: Diagnosis not present

## 2020-04-28 DIAGNOSIS — E669 Obesity, unspecified: Secondary | ICD-10-CM | POA: Diagnosis not present

## 2020-04-28 DIAGNOSIS — E1121 Type 2 diabetes mellitus with diabetic nephropathy: Secondary | ICD-10-CM | POA: Diagnosis not present

## 2020-04-28 DIAGNOSIS — I10 Essential (primary) hypertension: Secondary | ICD-10-CM

## 2020-04-28 DIAGNOSIS — E785 Hyperlipidemia, unspecified: Secondary | ICD-10-CM

## 2020-04-28 MED ORDER — TELMISARTAN-HCTZ 40-12.5 MG PO TABS
1.0000 | ORAL_TABLET | Freq: Every day | ORAL | 1 refills | Status: DC
Start: 2020-04-28 — End: 2020-12-23

## 2020-04-28 NOTE — Progress Notes (Signed)
Subjective:  Patient ID: Nichole Jensen, female    DOB: 07-Mar-1961  Age: 60 y.o. MRN: [id number]  CC: The primary encounter diagnosis was Essential hypertension. Diagnoses of Hyperlipidemia associated with type 2 diabetes mellitus (HCC), Obesity (BMI 30-39.9), and Type II diabetes mellitus with nephropathy (HCC) were also pertinent to this visit.  HPI TERRIS BODIN presents for FOLLOW UP  ON TYPE 2 DM, HyPERTENSION HYPERLIPIDEMIA AND OBESITY  This visit occurred during the SARS-CoV-2 public health emergency.  Safety protocols were in place, including screening questions prior to the visit, additional usage of staff PPE, and extensive cleaning of exam room while observing appropriate contact time as indicated for disinfecting solutions.   1) Right knee pain,  Subacute:  Has been  present since twisting knee [place]  4 weeks ago.  (Did not swell up or bruise).  Changed exercise routine (no squats) so the knee pain is improving.  Mostly  hurts going up and down stairs. Responds better to heat than ice. Hurts to kneel on it.  Has full ROM,  Full strength,  Does not give way .   Using willow balm topical liniment and tylenol.   2) Hyperlipidemia: LDL has risen by 35 pts  Due to high protein diet.  Advised to start high potency statin for primary prevention  Given concurrent  Dx of T2DM  3)  Obesity: has lost 7 lbs since august visit . Following a Low GI diet  High protein   Hyperlipidemia:  Her LDL up by 35 points  .  Willing to start statin .   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 aspirin 81 MG tablet Take 81 mg by mouth daily.    . Lactobacillus (PROBIOTIC ACIDOPHILUS PO) Take 1 capsule by mouth daily.    Marland Kitchen telmisartan-hydrochlorothiazide (MICARDIS HCT) 40-12.5 MG tablet Take 1 tablet by mouth daily. 90 tablet 0  . atorvastatin (LIPITOR) 20 MG tablet Take 1 tablet (20  mg total) by mouth daily. (Patient not taking: Reported on 04/28/2020) 30 tablet 2  . Ascorbic Acid (VITAMIN C) 1000 MG tablet Take 1,000 mg by mouth daily. (Patient not taking: No sig reported)     No facility-administered medications prior to visit.    Review of Systems;  Patient denies headache, fevers, malaise, unintentional weight loss, skin rash, eye pain, sinus congestion and sinus pain, sore throat, dysphagia,  hemoptysis , cough, dyspnea, wheezing, chest pain, palpitations, orthopnea, edema, abdominal pain, nausea, melena, diarrhea, constipation, flank pain, dysuria, hematuria, urinary  Frequency, nocturia, numbness, tingling, seizures,  Focal weakness, Loss of consciousness,  Tremor, insomnia, depression, anxiety, and suicidal ideation.      Objective:  BP 112/80 (BP Location: Left Arm, Patient Position: Sitting, Cuff Size: Large)   Pulse 84   Temp 98.4 F (36.9 C) (Oral)   Resp 14   Ht 5\' 3"  (1.6 m)   Wt 194 lb 9.6 oz (88.3 kg)   SpO2 98%   BMI 34.47 kg/m   BP Readings from Last 3 Encounters:  04/28/20 112/80  12/20/19 118/88  12/26/18 118/72    Wt Readings from Last 3 Encounters:  04/28/20 194 lb 9.6 oz (88.3 kg)  12/20/19 201 lb (91.2 kg)  12/26/18 200 lb (90.7 kg)    General appearance: alert, cooperative and appears stated age Ears: normal TM's and external ear  canals both ears Throat: lips, mucosa, and tongue normal; teeth and gums normal Neck: no adenopathy, no carotid bruit, supple, symmetrical, trachea midline and thyroid not enlarged, symmetric, no tenderness/mass/nodules Back: symmetric, no curvature. ROM normal. No CVA tenderness. Lungs: clear to auscultation bilaterally Heart: regular rate and rhythm, S1, S2 normal, no murmur, click, rub or gallop Abdomen: soft, non-tender; bowel sounds normal; no masses,  no organomegaly Pulses: 2+ and symmetric Skin: Skin color, texture, turgor normal. No rashes or lesions Lymph nodes: Cervical, supraclavicular,  and axillary nodes normal.  Lab Results  Component Value Date   HGBA1C 6.3 04/21/2020   HGBA1C 6.3 12/18/2019   HGBA1C 6.2 12/28/2018    Lab Results  Component Value Date   CREATININE 0.97 04/21/2020   CREATININE 0.92 12/18/2019   CREATININE 0.79 12/28/2018    Lab Results  Component Value Date   WBC 7.0 04/21/2020   HGB 14.2 04/21/2020   HCT 43.5 04/21/2020   PLT 194.0 04/21/2020   GLUCOSE 105 (H) 04/21/2020   CHOL 210 (H) 04/21/2020   TRIG 144.0 04/21/2020   HDL 47.40 04/21/2020   LDLDIRECT 124.0 12/28/2018   LDLCALC 134 (H) 04/21/2020   ALT 13 04/21/2020   AST 14 04/21/2020   NA 136 04/21/2020   K 4.1 04/21/2020   CL 99 04/21/2020   CREATININE 0.97 04/21/2020   BUN 21 04/21/2020   CO2 27 04/21/2020   TSH 2.60 04/21/2020   HGBA1C 6.3 04/21/2020   MICROALBUR 1.5 04/21/2020    MR BREAST BILATERAL W WO CONTRAST INC CAD  Result Date: 12/05/2019 CLINICAL DATA:  Breast cancer screening high risk. Genetic predisposition. LABS:  None. EXAM: BILATERAL BREAST MRI WITH AND WITHOUT CONTRAST TECHNIQUE: Multiplanar, multisequence MR images of both breasts were obtained prior to and following the intravenous administration of 10 ml of Gadavist Three-dimensional MR images were rendered by post-processing of the original MR data on an independent workstation. The three-dimensional MR images were interpreted, and findings are reported in the following complete MRI report for this study. Three dimensional images were evaluated at the independent DynaCad workstation COMPARISON:  None. FINDINGS: Breast composition: b. Scattered fibroglandular tissue. Background parenchymal enhancement: Mild Right breast: No mass or abnormal enhancement. Left breast: No mass or abnormal enhancement. Lymph nodes: No abnormal appearing lymph nodes. Ancillary findings:  None. IMPRESSION: No MRI evidence of malignancy in either breast. RECOMMENDATION: Continue annual screening mammography which is due in August  2021. BI-RADS CATEGORY  BI-RADS CATEGORY: 1: Negative. Electronically Signed   By: Audie Pinto M.D.   On: 12/05/2019 16:27    Assessment & Plan:   Problem List Items Addressed This Visit      Unprioritized   Essential hypertension - Primary   Relevant Orders   Comprehensive metabolic panel   Hyperlipidemia associated with type 2 diabetes mellitus ([place])     The [employer] of Cardiology recommends starting patients aged 67 or higher on moderate intensity statin therapy for LDL between 70-189 and 10 yr risk of CAD > 7.5% ; she has agreed to a  Trial of  statin therapy  Lab Results  Component Value Date   CHOL 210 (H) 04/21/2020   HDL 47.40 04/21/2020   LDLCALC 134 (H) 04/21/2020   LDLDIRECT 124.0 12/28/2018   TRIG 144.0 04/21/2020   CHOLHDL 4 04/21/2020         Obesity (BMI 30-39.9)    She has lost 7 lbs since last visit. I have encouraged her to continue to reduce her  BMI and encouraged  Continued weight loss with goal of 10% of body weight over the next 6 months using a low glycemic index diet and regular exercise a minimum of 5 days per week.        Type II diabetes mellitus with nephropathy (HCC)    Remains diet controlled.   Reminded to have her annual eye exams,  .  Up to date on  pneumovax vaccine ,flu vaccine.    Her proteinuria has been addressed with addition of ARB  Lab Results  Component Value Date   MICROALBUR 1.5 04/21/2020   Lab Results  Component Value Date   HGBA1C 6.3 04/21/2020            I provided  30 minutes  during this encounter reviewing patient's current problems and past surgeries, labs and imaging studies, providing counseling on the above mentioned problems In a face to face visit  , and coordination  of care .   I have discontinued Arville Lime C[place]head's vitamin C. I am also having her maintain her aspirin, Lactobacillus (PROBIOTIC ACIDOPHILUS PO), and atorvastatin.  No orders of the defined types were placed in this  encounter.   Medications Discontinued During This Encounter  Medication Reason  . Ascorbic Acid (VITAMIN C) 1000 MG tablet     Follow-up: Return in about 6 months (around 10/26/2020).   Crecencio Mc, MD

## 2020-04-28 NOTE — Patient Instructions (Addendum)
For your right knee pain :  You can add up to 2000 mg of acetominophen (tylenol) every day safely  In divided doses (500 mg every 6 hours  Or 1000  mg every 12 hours.)  If you add an NSAID Aleve 2 every 12  Or motrin/advil  4 tablets  Every 8 ,  Protect stomach from  GI irritation by taking prilosec 20 mg daily    Return in 3 weeks after starting the atorvastatin to have your liver enzymes checked

## 2020-04-29 NOTE — Assessment & Plan Note (Signed)
She has lost 7 lbs since last visit. I have encouraged her to continue to reduce her  BMI and encouraged  Continued weight loss with goal of 10% of body weight over the next 6 months using a low glycemic index diet and regular exercise a minimum of 5 days per week.

## 2020-04-29 NOTE — Assessment & Plan Note (Signed)
The [employer] of Cardiology recommends starting patients aged 60 or higher on moderate intensity statin therapy for LDL between 70-189 and 10 yr risk of CAD > 7.5% ; she has agreed to a  Trial of  statin therapy  Lab Results  Component Value Date   CHOL 210 (H) 04/21/2020   HDL 47.40 04/21/2020   LDLCALC 134 (H) 04/21/2020   LDLDIRECT 124.0 12/28/2018   TRIG 144.0 04/21/2020   CHOLHDL 4 04/21/2020

## 2020-04-29 NOTE — Assessment & Plan Note (Signed)
Remains diet controlled.   Reminded to have her annual eye exams,  .  Up to date on  pneumovax vaccine ,flu vaccine.    Her proteinuria has been addressed with addition of ARB  Lab Results  Component Value Date   MICROALBUR 1.5 04/21/2020   Lab Results  Component Value Date   HGBA1C 6.3 04/21/2020

## 2020-05-19 ENCOUNTER — Other Ambulatory Visit: Payer: Self-pay

## 2020-05-19 ENCOUNTER — Other Ambulatory Visit (INDEPENDENT_AMBULATORY_CARE_PROVIDER_SITE_OTHER): Payer: BC Managed Care – PPO

## 2020-05-19 DIAGNOSIS — I10 Essential (primary) hypertension: Secondary | ICD-10-CM

## 2020-05-19 LAB — COMPREHENSIVE METABOLIC PANEL
ALT: 15 U/L (ref 0–35)
AST: 13 U/L (ref 0–37)
Albumin: 4.5 g/dL (ref 3.5–5.2)
Alkaline Phosphatase: 96 U/L (ref 39–117)
BUN: 18 mg/dL (ref 6–23)
CO2: 25 mEq/L (ref 19–32)
Calcium: 9.6 mg/dL (ref 8.4–10.5)
Chloride: 103 mEq/L (ref 96–112)
Creatinine, Ser: 0.94 mg/dL (ref 0.40–1.20)
GFR: 66.28 mL/min (ref >60.00)
Glucose, Bld: 106 mg/dL — ABNORMAL HIGH (ref 70–99)
Potassium: 4 mEq/L (ref 3.5–5.1)
Sodium: 138 mEq/L (ref 135–145)
Total Bilirubin: 0.4 mg/dL (ref 0.2–1.2)
Total Protein: 6.5 g/dL (ref 6.0–8.3)

## 2020-05-20 MED ORDER — ATORVASTATIN CALCIUM 20 MG PO TABS: 20.0000 mg | ORAL_TABLET | Freq: Every day | ORAL | 1 refills | Status: DC

## 2020-05-20 NOTE — Progress Notes (Signed)
Your surveillance labs are normal .  You may continue  your current medications  and repeat fastings  lipids in 3 to 6 months

## 2020-06-04 LAB — HM DIABETES EYE EXAM

## 2020-08-20 ENCOUNTER — Telehealth: Payer: BC Managed Care – PPO | Admitting: Internal Medicine

## 2020-08-20 DIAGNOSIS — E1121 Type 2 diabetes mellitus with diabetic nephropathy: Secondary | ICD-10-CM

## 2020-08-20 DIAGNOSIS — J0121 Acute recurrent ethmoidal sinusitis: Secondary | ICD-10-CM | POA: Diagnosis not present

## 2020-08-20 MED ORDER — AMOXICILLIN-POT CLAVULANATE 875-125 MG PO TABS: 1.0000 | ORAL_TABLET | Freq: Two times a day (BID) | ORAL | 0 refills | Status: DC

## 2020-08-20 MED ORDER — PREDNISONE 10 MG PO TABS: ORAL_TABLET | ORAL | 0 refills | Status: DC

## 2020-08-20 NOTE — Progress Notes (Signed)
Virtual Visit converted to  Telephone  Note  I connected with@ on 08/20/20 at  3:00 PM EDT by a video enabled telemedicine application and verified that I am speaking with the correct person using two identifiers.  Interactive video telecommunications was initially established beteen this provider and patient without audio. We converted the visit to audio only  and verified that I am speaking with the correct person using two identifiers  Location patient: home Location provider:work or home office Persons participating in the virtual visit: patient, provider  I discussed the limitations of evaluation and management by telemedicine and the availability of in person appointments. The patient expressed understanding and agreed to proceed.   HPI:  60 yr old female with persistent sinus symptoms which began in February after cleaning out a dusty house .  Symptoms improved but did not resolve with use of antihistamine (claritin).  However she stopped the claritin several months ago. Over the past 2 weeks symptoms have worsened and she has developed cough and laryngitis. currently endorsing an improvement over the last 48 hours since restarting claritin,  Sleeping better with less cough,  Now nonproductive. Occasional post nasal drainage .  Staying indoors due to wind and pollen making her feel worse.  . No known COVID contacts. Some tightness in the chest which started 2 weeks ago with pollen onset   ROS: See pertinent positives and negatives per HPI.  Past Medical History:  Diagnosis Date  . Allergy   . Diverticulitis large intestine w/o perforation or abscess w/o bleeding 10/20/2012  . Family history of brain cancer   . Family history of breast cancer in female   . Family history of colon cancer   . Hyperlipidemia   . Hypertension     Past Surgical History:  Procedure Laterality Date  . ABDOMINAL HYSTERECTOMY  2005   endometriosis and adenomyosis, Dr. Rayford Halsted  . BREAST BIOPSY Right 2001    CORE W/CLIP - NEG  . BREAST CYST ASPIRATION Right 2001   NEG  . BREAST SURGERY  2000   cyst 2000; right; benign  . TONSILLECTOMY  1968    Family History  Problem Relation Age of Onset  . Heart disease Father 36       massive MI  . Heart disease Paternal Uncle   . Heart disease Paternal Grandmother   . Heart disease Paternal Grandfather   . Diabetes Sister   . Hypertension Sister   . Diabetes Brother   . Hypertension Brother   . Cancer Brother 29       female breast cancer, spreading to lung, non-smoker  . COPD Maternal Uncle        d. 67  . Brain cancer Paternal Aunt   . Polycythemia Paternal Aunt   . Colon cancer Paternal Aunt   . Polycythemia Paternal Aunt   . Heart disease Paternal Aunt   . Polycythemia Paternal Aunt   . Stomach cancer Neg Hx   . Breast cancer Neg Hx     SOCIAL HX:  reports that she has never smoked. She has never used smokeless tobacco. She reports current alcohol use. She reports that she does not use drugs.   Current Outpatient Medications:  .  amoxicillin-clavulanate (AUGMENTIN) 875-125 MG tablet, Take 1 tablet by mouth 2 (two) times daily., Disp: 14 tablet, Rfl: 0 .  atorvastatin (LIPITOR) 20 MG tablet, Take 1 tablet (20 mg total) by mouth daily., Disp: 90 tablet, Rfl: 1 .  Lactobacillus (PROBIOTIC ACIDOPHILUS PO), Take 1  capsule by mouth daily., Disp: , Rfl:  .  loratadine (CLARITIN) 10 MG tablet, Take 10 mg by mouth daily., Disp: , Rfl:  .  predniSONE (DELTASONE) 10 MG tablet, 6 tablets on Day 1 , then reduce by 1 tablet daily until gone, Disp: 21 tablet, Rfl: 0 .  telmisartan-hydrochlorothiazide (MICARDIS HCT) 40-12.5 MG tablet, Take 1 tablet by mouth daily., Disp: 90 tablet, Rfl: 1  EXAM:   General impression: alert, cooperative and articulate.  No signs of being in distress  Lungs: speech is fluent sentence length suggests that patient is not short of breath but is  punctuated by cough .   Psych: affect normal.  speech is articulate and non  pressured .  Denies suicidal thoughts   ASSESSMENT AND PLAN:  Type II diabetes mellitus with nephropathy ([place]) Advised to stop daily aspirin and continue ARB and statin   Acute recurrent sinusitis Given chronicity of symptoms, development of facial pain and history consistent with bacterial URI,  Will treat with empiric antibiotics, decongestants, and saline lavage.  Adding 2nd dose of antihistamine at night to manage morning symptoms of allergic rhinitis.    Updated Medication List Outpatient Encounter Medications as of 08/20/2020  Medication Sig  . amoxicillin-clavulanate (AUGMENTIN) 875-125 MG tablet Take 1 tablet by mouth 2 (two) times daily.  Marland Kitchen atorvastatin (LIPITOR) 20 MG tablet Take 1 tablet (20 mg total) by mouth daily.  . Lactobacillus (PROBIOTIC ACIDOPHILUS PO) Take 1 capsule by mouth daily.  Marland Kitchen loratadine (CLARITIN) 10 MG tablet Take 10 mg by mouth daily.  . predniSONE (DELTASONE) 10 MG tablet 6 tablets on Day 1 , then reduce by 1 tablet daily until gone  . telmisartan-hydrochlorothiazide (MICARDIS HCT) 40-12.5 MG tablet Take 1 tablet by mouth daily.  . [DISCONTINUED] aspirin 81 MG tablet Take 81 mg by mouth daily. (Patient not taking: Reported on 08/20/2020)   No facility-administered encounter medications on file as of 08/20/2020.       I discussed the assessment and treatment plan with the patient. The patient was provided an opportunity to ask questions and all were answered. The patient agreed with the plan and demonstrated an understanding of the instructions.   The patient was advised to call back or seek an in-person evaluation if the symptoms worsen or if the condition fails to improve as anticipated.     Crecencio Mc, MD

## 2020-08-20 NOTE — Assessment & Plan Note (Signed)
Given chronicity of symptoms, development of facial pain and history consistent with bacterial URI,  Will treat with empiric antibiotics, decongestants, and saline lavage.  Adding 2nd dose of antihistamine at night to manage morning symptoms of allergic rhinitis.

## 2020-08-20 NOTE — Assessment & Plan Note (Addendum)
Advised to stop daily aspirin and continue ARB and statin

## 2020-12-08 DIAGNOSIS — E1169 Type 2 diabetes mellitus with other specified complication: Secondary | ICD-10-CM

## 2020-12-08 DIAGNOSIS — I10 Essential (primary) hypertension: Secondary | ICD-10-CM

## 2020-12-08 DIAGNOSIS — E785 Hyperlipidemia, unspecified: Secondary | ICD-10-CM

## 2020-12-08 DIAGNOSIS — E1121 Type 2 diabetes mellitus with diabetic nephropathy: Secondary | ICD-10-CM

## 2020-12-08 DIAGNOSIS — Z0000 Encounter for general adult medical examination without abnormal findings: Secondary | ICD-10-CM

## 2020-12-10 ENCOUNTER — Emergency Department
Admission: EM | Admit: 2020-12-10 | Discharge: 2020-12-10 | Disposition: A | Discharge status: Home / Self Care | Payer: BC Managed Care – PPO | Attending: Emergency Medicine | Admitting: Emergency Medicine

## 2020-12-10 ENCOUNTER — Other Ambulatory Visit: Payer: Self-pay

## 2020-12-10 ENCOUNTER — Emergency Department: Payer: BC Managed Care – PPO

## 2020-12-10 DIAGNOSIS — I10 Essential (primary) hypertension: Secondary | ICD-10-CM | POA: Insufficient documentation

## 2020-12-10 DIAGNOSIS — E1140 Type 2 diabetes mellitus with diabetic neuropathy, unspecified: Secondary | ICD-10-CM | POA: Diagnosis not present

## 2020-12-10 DIAGNOSIS — S61551A Open bite of right wrist, initial encounter: Secondary | ICD-10-CM | POA: Insufficient documentation

## 2020-12-10 DIAGNOSIS — Z23 Encounter for immunization: Secondary | ICD-10-CM | POA: Diagnosis not present

## 2020-12-10 DIAGNOSIS — W540XXA Bitten by dog, initial encounter: Secondary | ICD-10-CM | POA: Diagnosis not present

## 2020-12-10 DIAGNOSIS — Z79899 Other long term (current) drug therapy: Secondary | ICD-10-CM | POA: Insufficient documentation

## 2020-12-10 DIAGNOSIS — E785 Hyperlipidemia, unspecified: Secondary | ICD-10-CM | POA: Diagnosis not present

## 2020-12-10 DIAGNOSIS — S6991XA Unspecified injury of right wrist, hand and finger(s), initial encounter: Secondary | ICD-10-CM | POA: Diagnosis present

## 2020-12-10 DIAGNOSIS — E1169 Type 2 diabetes mellitus with other specified complication: Secondary | ICD-10-CM | POA: Insufficient documentation

## 2020-12-10 MED ORDER — HYDROCODONE-ACETAMINOPHEN 5-325 MG PO TABS: 1.0000 | ORAL_TABLET | Freq: Four times a day (QID) | ORAL | 0 refills | Status: AC | PRN

## 2020-12-10 MED ORDER — AMOXICILLIN-POT CLAVULANATE 875-125 MG PO TABS: 1.0000 | ORAL_TABLET | Freq: Two times a day (BID) | ORAL | 0 refills | Status: AC

## 2020-12-10 MED ORDER — IBUPROFEN 600 MG PO TABS
600.0000 mg | ORAL_TABLET | Freq: Once | ORAL | Status: AC
  Administered 2020-12-10: 600 mg via ORAL
  Filled 2020-12-10: qty 1

## 2020-12-10 MED ORDER — TETANUS-DIPHTH-ACELL PERTUSSIS 5-2.5-18.5 LF-MCG/0.5 IM SUSY
0.5000 mL | PREFILLED_SYRINGE | Freq: Once | INTRAMUSCULAR | Status: AC
  Administered 2020-12-10: 0.5 mL via INTRAMUSCULAR
  Filled 2020-12-10: qty 0.5

## 2020-12-10 NOTE — ED Triage Notes (Signed)
Pt states she was bit by a german shepherd that is known today on the right lateral chest and right FA, bleeding is controlled. States it was reported and animal control was present, states the animal was up to date on shots. Pt is unsure of her last tdap.

## 2020-12-10 NOTE — Discharge Instructions (Addendum)
Steri-Strips should fall off on their own. Take Augmentin twice daily for the next 10 days. Please continue taking probiotic daily especially while on Augmentin. You can take Norco for pain. Please keep in mind that Norco can be constipating and if needed, please start daily stool softener, MiraLAX. If you notice any redness surrounding wound site, please return for reevaluation.

## 2020-12-10 NOTE — ED Provider Notes (Signed)
[facility]-EMERGENCY DEPARTMENT  ____________________________________________  Time seen: Approximately 3:28 PM  I have reviewed the triage vital signs and the nursing notes.   HISTORY  Chief Complaint Animal Bite   Historian Patient     HPI Nichole Jensen is a 60 y.o. female presents to the emergency department with a dog bite wound to the right forearm and the right breast.  Patient states that she was bitten by a [place] which is a Forensic scientist.  She states that she has seen dog multiple times and is available to be quarantined for signs and symptoms of rabies.  Patient is right-hand dominant.  No numbness or tingling in the right hand.  She cannot recall her last tetanus shot.  Dog bite occurred approximately 1 hour before presenting to the emergency department.   Past Medical History:  Diagnosis Date   Allergy    Diverticulitis large intestine w/o perforation or abscess w/o bleeding 10/20/2012   Family history of brain cancer    Family history of breast cancer in female    Family history of colon cancer    Hyperlipidemia    Hypertension      Immunizations up to date:  Yes.     Past Medical History:  Diagnosis Date   Allergy    Diverticulitis large intestine w/o perforation or abscess w/o bleeding 10/20/2012   Family history of brain cancer    Family history of breast cancer in female    Family history of colon cancer    Hyperlipidemia    Hypertension     Patient Active Problem List   Diagnosis Date Noted   Visit for preventive health examination 12/22/2019   Hyperlipidemia associated with type 2 diabetes mellitus ([place]) 12/22/2019   At high risk for breast cancer 12/27/2018   Genetic testing 11/01/2017   Family history of breast cancer in female    Family history of colon cancer    Family history of brain cancer    Acute recurrent sinusitis 11/03/2016   Type II diabetes mellitus with nephropathy ([place]) 05/22/2016   Family history of lung cancer  10/18/2014   Obesity (BMI 30-39.9) 01/01/2014   Essential hypertension 04/22/2011   Seasonal allergies 04/22/2011    Past Surgical History:  Procedure Laterality Date   ABDOMINAL HYSTERECTOMY  2005   endometriosis and adenomyosis, Dr. Rayford Halsted   BREAST BIOPSY Right 2001   CORE W/CLIP - NEG   BREAST CYST ASPIRATION Right 2001   NEG   BREAST SURGERY  2000   cyst 2000; right; benign   TONSILLECTOMY  1968    Prior to Admission medications   Medication Sig Start Date End Date Taking? Authorizing Provider  amoxicillin-clavulanate (AUGMENTIN) 875-125 MG tablet Take 1 tablet by mouth 2 (two) times daily for 10 days. 12/10/20 12/20/20 Yes Lannie Fields, PA-C  HYDROcodone-acetaminophen (NORCO/VICODIN) 5-325 MG tablet Take 1 tablet by mouth every 6 (six) hours as needed for up to 2 days for moderate pain. 12/10/20 12/12/20 Yes Vallarie Mare M, PA-C  atorvastatin (LIPITOR) 20 MG tablet Take 1 tablet (20 mg total) by mouth daily. 05/20/20   Crecencio Mc, MD  Lactobacillus (PROBIOTIC ACIDOPHILUS PO) Take 1 capsule by mouth daily.    [provider]  loratadine (CLARITIN) 10 MG tablet Take 10 mg by mouth daily.    [provider]  predniSONE (DELTASONE) 10 MG tablet 6 tablets on Day 1 , then reduce by 1 tablet daily until gone 08/20/20   Crecencio Mc, MD  telmisartan-hydrochlorothiazide (MICARDIS HCT) 40-12.5 MG tablet Take 1 tablet by mouth daily. 04/28/20   Crecencio Mc, MD    Allergies Macrodantin  Family History  Problem Relation Age of Onset   Heart disease Father 53       massive MI   Heart disease Paternal Uncle    Heart disease Paternal Grandmother    Heart disease Paternal Grandfather    Diabetes Sister    Hypertension Sister    Diabetes Brother    Hypertension Brother    Cancer Brother 36       female breast cancer, spreading to lung, non-smoker   COPD Maternal Uncle        d. 40   Brain cancer Paternal Aunt    Polycythemia Paternal Aunt    Colon cancer  Paternal Aunt    Polycythemia Paternal Aunt    Heart disease Paternal Aunt    Polycythemia Paternal Aunt    Stomach cancer Neg Hx    Breast cancer Neg Hx     Social History Social History   Tobacco Use   Smoking status: Never   Smokeless tobacco: Never  Substance Use Topics   Alcohol use: Yes    Comment: rare   Drug use: No     Review of Systems  Constitutional: No fever/chills Eyes:  No discharge ENT: No upper respiratory complaints. Respiratory: no cough. No SOB/ use of accessory muscles to breath Gastrointestinal:   No nausea, no vomiting.  No diarrhea.  No constipation. Musculoskeletal: Patient has right forearm pain.  Skin: Patient has dog bite wound.   ____________________________________________   PHYSICAL EXAM:  VITAL SIGNS: ED Triage Vitals  Enc Vitals Group     BP 12/10/20 1259 132/79     Pulse Rate 12/10/20 1259 78     Resp 12/10/20 1259 16     Temp 12/10/20 1302 98 F (36.7 C)     Temp Source 12/10/20 1259 Oral     SpO2 12/10/20 1259 98 %     Weight 12/10/20 1300 190 lb (86.2 kg)     Height 12/10/20 1300 '5\' 3"'$  (1.6 m)     Head Circumference --      Peak Flow --      Pain Score 12/10/20 1300 4     Pain Loc --      Pain Edu? --      Excl. in [place]? --      Constitutional: Alert and oriented. Well appearing and in no acute distress. Eyes: Conjunctivae are normal. PERRL. EOMI. Head: Atraumatic. ENT: Cardiovascular: Normal rate, regular rhythm. Normal S1 and S2.  Good peripheral circulation. Respiratory: Normal respiratory effort without tachypnea or retractions. Lungs CTAB. Good air entry to the bases with no decreased or absent breath sounds Gastrointestinal: Bowel sounds x 4 quadrants. Soft and nontender to palpation. No guarding or rigidity. No distention. Musculoskeletal: Patient performs full range of motion at the right wrist.  Patient has swelling along the right forearm.  Patient can move all 5 right fingers.  Palpable radial and ulnar  pulses bilaterally and symmetrically.  Capillary refill less than 2 seconds on the right. Neurologic:  Normal for age. No gross focal neurologic deficits are appreciated.  Skin:  Patient has 3 small puncture wounds along the dorsal aspect of the right wrist.  Psychiatric: Mood and affect are normal for age. Speech and behavior are normal.   ____________________________________________   LABS (all labs ordered are listed, but only abnormal results are displayed)  Labs  Reviewed - No data to display ____________________________________________  EKG   ____________________________________________  RADIOLOGY Unk Pinto, personally viewed and evaluated these images (plain radiographs) as part of my medical decision making, as well as reviewing the written report by the radiologist.    DG Forearm Right  Result Date: 12/10/2020 CLINICAL DATA:  Dog bite. EXAM: RIGHT FOREARM - 2 VIEW COMPARISON:  None. FINDINGS: There is no evidence of fracture or other focal bone lesions. Dorsal soft tissue swelling is noted in distal forearm. No radiopaque foreign body is noted. IMPRESSION: Distal soft tissue swelling is noted. No radiopaque foreign body is noted. No fracture or dislocation is noted. Electronically Signed   By: Marijo Conception M.D.   On: 12/10/2020 16:02    ____________________________________________    PROCEDURES  Procedure(s) performed:     Marland KitchenMarland KitchenLaceration Repair  Date/Time: 12/10/2020 4:20 PM Performed by: Lannie Fields, PA-C Authorized by: Lannie Fields, PA-C   Consent:    Consent obtained:  Verbal   Risks discussed:  Infection and pain Universal protocol:    Procedure explained and questions answered to patient or proxy's satisfaction: yes     Patient identity confirmed:  Verbally with patient Anesthesia:    Anesthesia method:  None Laceration details:    Location:  Shoulder/arm   Shoulder/arm location:  R lower arm   Length (cm):  0.5   Depth (mm):   5 Pre-procedure details:    Preparation:  Patient was prepped and draped in usual sterile fashion Exploration:    Limited defect created (wound extended): yes     Wound exploration: wound explored through full range of motion   Treatment:    Area cleansed with:  Povidone-iodine   Amount of cleaning:  Standard   Irrigation solution:  Sterile saline   Irrigation volume:  500   Irrigation method:  Pressure wash   Debridement:  None Skin repair:    Repair method:  Steri-Strips Approximation:    Approximation:  Close Repair type:    Repair type:  Simple Post-procedure details:    Dressing:  Bulky dressing     Medications  Tdap (BOOSTRIX) injection 0.5 mL (0.5 mLs Intramuscular Given 12/10/20 1539)  ibuprofen (ADVIL) tablet 600 mg (600 mg Oral Given 12/10/20 1539)     ____________________________________________   INITIAL IMPRESSION / ASSESSMENT AND PLAN / ED COURSE  Pertinent labs & imaging results that were available during my care of the patient were reviewed by me and considered in my medical decision making (see chart for details).       Assessment and plan:  Dog bite wound 60 year old female presents to the emergency department with small puncture wounds along the dorsal aspect of the right forearm and a deep abrasion along her right breast.  Vital signs are reassuring at triage.  On physical exam, patient was alert, active and nontoxic-appearing.  Her wounds were copiously irrigated in the emergency department and x-ray was obtained of her right forearm which showed no retained teeth or foreign bodies.  Steri-Strips were applied on puncture wounds along the right dorsal forearm.  Patient was started on Augmentin twice daily for the next 10 days.  I explained to patient that there is a high risk for infection with dog bite wounds without compliance to antibiotic and she voiced understanding.  Her tetanus status was updated in the emergency department.  Dog is available to  be quarantined for signs and symptoms of rabies and patient does not wish to initiate rabies  vaccine series at this time.  She has reported incident to police and animal control.  Recommended return to the emergency department for reevaluation if patient notices redness or streaking surrounding the wound site.   ____________________________________________  FINAL CLINICAL IMPRESSION(S) / ED DIAGNOSES  Final diagnoses:  Dog bite, initial encounter      NEW MEDICATIONS STARTED DURING THIS VISIT:  ED Discharge Orders          Ordered    amoxicillin-clavulanate (AUGMENTIN) 875-125 MG tablet  2 times daily        12/10/20 1605    HYDROcodone-acetaminophen (NORCO/VICODIN) 5-325 MG tablet  Every 6 hours PRN        12/10/20 1605                This chart was dictated using voice recognition software/Dragon. Despite best efforts to proofread, errors can occur which can change the meaning. Any change was purely unintentional.     Lannie Fields, PA-C 12/10/20 1624    Naaman Plummer, MD 12/10/20 [phone]

## 2020-12-16 ENCOUNTER — Other Ambulatory Visit (INDEPENDENT_AMBULATORY_CARE_PROVIDER_SITE_OTHER): Payer: BC Managed Care – PPO

## 2020-12-16 ENCOUNTER — Other Ambulatory Visit: Payer: Self-pay

## 2020-12-16 DIAGNOSIS — Z0000 Encounter for general adult medical examination without abnormal findings: Secondary | ICD-10-CM

## 2020-12-16 DIAGNOSIS — E1169 Type 2 diabetes mellitus with other specified complication: Secondary | ICD-10-CM

## 2020-12-16 DIAGNOSIS — E1121 Type 2 diabetes mellitus with diabetic nephropathy: Secondary | ICD-10-CM | POA: Diagnosis not present

## 2020-12-16 DIAGNOSIS — E785 Hyperlipidemia, unspecified: Secondary | ICD-10-CM

## 2020-12-16 DIAGNOSIS — I10 Essential (primary) hypertension: Secondary | ICD-10-CM | POA: Diagnosis not present

## 2020-12-16 LAB — COMPREHENSIVE METABOLIC PANEL
ALT: 18 U/L (ref 0–35)
AST: 18 U/L (ref 0–37)
Albumin: 4.3 g/dL (ref 3.5–5.2)
Alkaline Phosphatase: 108 U/L (ref 39–117)
BUN: 17 mg/dL (ref 6–23)
CO2: 24 mEq/L (ref 19–32)
Calcium: 9.7 mg/dL (ref 8.4–10.5)
Chloride: 103 mEq/L (ref 96–112)
Creatinine, Ser: 0.9 mg/dL (ref 0.40–1.20)
GFR: 69.55 mL/min (ref >60.00)
Glucose, Bld: 100 mg/dL — ABNORMAL HIGH (ref 70–99)
Potassium: 4.3 mEq/L (ref 3.5–5.1)
Sodium: 137 mEq/L (ref 135–145)
Total Bilirubin: 0.5 mg/dL (ref 0.2–1.2)
Total Protein: 7 g/dL (ref 6.0–8.3)

## 2020-12-16 LAB — CBC WITH DIFFERENTIAL/PLATELET
Basophils Absolute: 0.1 10*3/uL (ref 0.0–0.1)
Basophils Relative: 0.8 % (ref 0.0–3.0)
Eosinophils Absolute: 0.1 10*3/uL (ref 0.0–0.7)
Eosinophils Relative: 1.7 % (ref 0.0–5.0)
HCT: 39.5 % (ref 36.0–46.0)
Hemoglobin: 13.2 g/dL (ref 12.0–15.0)
Lymphocytes Relative: 29.2 % (ref 12.0–46.0)
Lymphs Abs: 2.1 10*3/uL (ref 0.7–4.0)
MCHC: 33.3 g/dL (ref 30.0–36.0)
MCV: 85.2 fl (ref 78.0–100.0)
Monocytes Absolute: 0.5 10*3/uL (ref 0.1–1.0)
Monocytes Relative: 7.1 % (ref 3.0–12.0)
Neutro Abs: 4.3 10*3/uL (ref 1.4–7.7)
Neutrophils Relative %: 61.2 % (ref 43.0–77.0)
Platelets: 186 10*3/uL (ref 150.0–400.0)
RBC: 4.64 Mil/uL (ref 3.87–5.11)
RDW: 13.7 % (ref 11.5–15.5)
WBC: 7.1 10*3/uL (ref 4.0–10.5)

## 2020-12-16 LAB — LIPID PANEL
Cholesterol: 117 mg/dL (ref 0–200)
HDL: 44.5 mg/dL (ref >39.00)
LDL Cholesterol: 43 mg/dL (ref 0–99)
NonHDL: 72.19
Total CHOL/HDL Ratio: 3
Triglycerides: 147 mg/dL (ref 0.0–149.0)
VLDL: 29.4 mg/dL (ref 0.0–40.0)

## 2020-12-16 LAB — HEMOGLOBIN A1C: Hgb A1c MFr Bld: 6.5 % (ref 4.6–6.5)

## 2020-12-16 LAB — MICROALBUMIN / CREATININE URINE RATIO
Creatinine,U: 17.2 mg/dL
Microalb Creat Ratio: 4.1 mg/g (ref 0.0–30.0)
Microalb, Ur: <0.7 mg/dL (ref 0.0–1.9)

## 2020-12-16 LAB — TSH: TSH: 4.55 u[IU]/mL (ref 0.35–5.50)

## 2020-12-22 ENCOUNTER — Other Ambulatory Visit: Payer: Self-pay

## 2020-12-22 ENCOUNTER — Ambulatory Visit (INDEPENDENT_AMBULATORY_CARE_PROVIDER_SITE_OTHER): Payer: BC Managed Care – PPO | Admitting: Internal Medicine

## 2020-12-22 ENCOUNTER — Encounter: Payer: Self-pay | Admitting: Internal Medicine

## 2020-12-22 VITALS — BP 118/72 | HR 71 | Temp 95.8°F | Ht 63.0 in | Wt 194.8 lb

## 2020-12-22 DIAGNOSIS — Z1231 Encounter for screening mammogram for malignant neoplasm of breast: Secondary | ICD-10-CM | POA: Diagnosis not present

## 2020-12-22 DIAGNOSIS — Z23 Encounter for immunization: Secondary | ICD-10-CM | POA: Diagnosis not present

## 2020-12-22 DIAGNOSIS — E669 Obesity, unspecified: Secondary | ICD-10-CM

## 2020-12-22 DIAGNOSIS — E1121 Type 2 diabetes mellitus with diabetic nephropathy: Secondary | ICD-10-CM | POA: Diagnosis not present

## 2020-12-22 DIAGNOSIS — S51851D Open bite of right forearm, subsequent encounter: Secondary | ICD-10-CM

## 2020-12-22 DIAGNOSIS — I10 Essential (primary) hypertension: Secondary | ICD-10-CM | POA: Diagnosis not present

## 2020-12-22 DIAGNOSIS — Z0000 Encounter for general adult medical examination without abnormal findings: Secondary | ICD-10-CM

## 2020-12-22 DIAGNOSIS — S51851S Open bite of right forearm, sequela: Secondary | ICD-10-CM | POA: Insufficient documentation

## 2020-12-22 DIAGNOSIS — Z9889 Other specified postprocedural states: Secondary | ICD-10-CM | POA: Insufficient documentation

## 2020-12-22 DIAGNOSIS — Z0001 Encounter for general adult medical examination with abnormal findings: Secondary | ICD-10-CM

## 2020-12-22 NOTE — Assessment & Plan Note (Signed)

## 2020-12-22 NOTE — Patient Instructions (Signed)
Your annual mammogram has been ordered.  You are encouraged (required) to call to make your appointment at [facility]   Referral to [facility] is in progress  I recommend resuming ice therapy for 15 minute intervals 3 or 4 times daily   Hold off on using weighted forearm exercises until orhto sees you

## 2020-12-22 NOTE — Assessment & Plan Note (Signed)
Advised to stop daily aspirin and continue ARB and statin she prefers to postponed anti hyperglycemics for 3 months in favor of renewed attention to low GI diet.

## 2020-12-22 NOTE — Assessment & Plan Note (Signed)
Well controlled on current regimen. Renal function stable, no changes today. 

## 2020-12-22 NOTE — Assessment & Plan Note (Signed)
Referring to [facility] to evaluate if hematoma can be drained

## 2020-12-22 NOTE — Progress Notes (Signed)
Patient ID: Nichole Jensen, female    DOB: 1960-05-31  Age: 60 y.o. MRN: [id number]  The patient is here for annual  preventive  examination and management of other chronic and acute problems.   This visit occurred during the SARS-CoV-2 public health emergency.  Safety protocols were in place, including screening questions prior to the visit, additional usage of staff PPE, and extensive cleaning of exam room while observing appropriate contact time as indicated for disinfecting solutions.   Victim of dog attack with bites on right wrist and right breast by [place].  Treated in ER.  Tetanus vaccine given. Had a large  but decreaseing in size hematoma .   Plain films were done to rule out fracture. Augmentin given.     The risk factors are reflected in the social history.  The roster of all physicians providing medical care to patient - is listed in the Snapshot section of the chart.  Activities of daily living:  The patient is 100% independent in all ADLs: dressing, toileting, feeding as well as independent mobility  Home safety : The patient has smoke detectors in the home. They wear seatbelts.  There are no firearms at home. There is no violence in the home.   There is no risks for hepatitis, STDs or HIV. There is no   history of blood transfusion. They have no travel history to infectious disease endemic areas of the world.  The patient has seen their dentist in the last six month. They have seen their eye doctor in the last year. They admit to slight hearing difficulty with regard to whispered voices and some television programs.  They have deferred audiologic testing in the last year.  They do not  have excessive sun exposure. Discussed the need for sun protection: hats, long sleeves and use of sunscreen if there is significant sun exposure.   Diet: the importance of a healthy diet is discussed. They do have a healthy diet.  The benefits of regular aerobic exercise were discussed.  She walks 4 times per week ,  20 minutes.   Depression screen: there are no signs or vegative symptoms of depression- irritability, change in appetite, anhedonia, sadness/tearfullness.  Cognitive assessment: the patient manages all their financial and personal affairs and is actively engaged. They could relate day,date,year and events; recalled 2/3 objects at 3 minutes; performed clock-face test normally.  The following portions of the patient's history were reviewed and updated as appropriate: allergies, current medications, past family history, past medical history,  past surgical history, past social history  and problem list.  Visual acuity was not assessed per patient preference since she has regular follow up with her ophthalmologist. Hearing and body mass index were assessed and reviewed.   During the course of the visit the patient was educated and counseled about appropriate screening and preventive services including : fall prevention , diabetes screening, nutrition counseling, colorectal cancer screening, and recommended immunizations.    CC: There were no encounter diagnoses.  Recently suffered a vicious attack from a [place] . She sustained bites to the right lateral breast and right forearm.  ER evaluation included x rays of forearm,  and tetanus booster.   Type 2 DM:  reviewed labs.  Weight gain due to dietary indiscretions and lack of exercise.  History Nichole Jensen has a past medical history of Allergy, Diverticulitis large intestine w/o perforation or abscess w/o bleeding (10/20/2012), Family history of brain cancer, Family history of breast cancer in female,  Family history of colon cancer, Hyperlipidemia, and Hypertension.   She has a past surgical history that includes Breast surgery (2000); Tonsillectomy (1968); Abdominal hysterectomy (2005); Breast biopsy (Right, 2001); and Breast cyst aspiration (Right, 2001).   Her family history includes Brain cancer in her  paternal aunt; COPD in her maternal uncle; Cancer (age of onset: 4) in her brother; Colon cancer in her paternal aunt; Diabetes in her brother and sister; Heart disease in her paternal aunt, paternal grandfather, paternal grandmother, and paternal uncle; Heart disease (age of onset: 59) in her father; Hypertension in her brother and sister; Polycythemia in her paternal aunt, paternal aunt, and paternal aunt.She reports that she has never smoked. She has never used smokeless tobacco. She reports current alcohol use. She reports that she does not use drugs.  Outpatient Medications Prior to Visit  Medication Sig Dispense Refill   atorvastatin (LIPITOR) 20 MG tablet Take 1 tablet (20 mg total) by mouth daily. 90 tablet 1   Lactobacillus (PROBIOTIC ACIDOPHILUS PO) Take 1 capsule by mouth daily.     loratadine (CLARITIN) 10 MG tablet Take 10 mg by mouth daily.     telmisartan-hydrochlorothiazide (MICARDIS HCT) 40-12.5 MG tablet Take 1 tablet by mouth daily. 90 tablet 1   predniSONE (DELTASONE) 10 MG tablet 6 tablets on Day 1 , then reduce by 1 tablet daily until gone 21 tablet 0   No facility-administered medications prior to visit.    Review of Systems  Patient denies headache, fevers, malaise, unintentional weight loss, skin rash, eye pain, sinus congestion and sinus pain, sore throat, dysphagia,  hemoptysis , cough, dyspnea, wheezing, chest pain, palpitations, orthopnea, edema, abdominal pain, nausea, melena, diarrhea, constipation, flank pain, dysuria, hematuria, urinary  Frequency, nocturia, numbness, tingling, seizures,  Focal weakness, Loss of consciousness,  Tremor, insomnia, depression, anxiety, and suicidal ideation.     Objective:  BP 118/72 (BP Location: Left Arm, Patient Position: Sitting, Cuff Size: Normal)   Pulse 71   Temp (!) 95.8 F (35.4 C) (Temporal)   Ht '5\' 3"'$  (1.6 m)   Wt 194 lb 12.8 oz (88.4 kg)   SpO2 97%   BMI 34.51 kg/m   Physical Exam  General appearance: alert,  cooperative and appears stated age Head: Normocephalic, without obvious abnormality, atraumatic Eyes: conjunctivae/corneas clear. PERRL, EOM's intact. Fundi benign. Ears: normal TM's and external ear canals both ears Nose: Nares normal. Septum midline. Mucosa normal. No drainage or sinus tenderness. Throat: lips, mucosa, and tongue normal; teeth and gums normal Neck: no adenopathy, no carotid bruit, no JVD, supple, symmetrical, trachea midline and thyroid not enlarged, symmetric, no tenderness/mass/nodules Lungs: clear to auscultation bilaterally Breasts: resolving bite with peri wound bruising,  right lateral breast.  Otherwise normal appearance, no masses or tenderness Heart: regular rate and rhythm, S1, S2 normal, no murmur, click, rub or gallop Abdomen: soft, non-tender; bowel sounds normal; no masses,  no organomegaly Extremities: right forearm with steristrips applied to multiple vite marks overlying a golfball sized hematoma,  atraumatic, no cyanosis or edema Pulses: 2+ and symmetric Skin: Skin color, texture, turgor normal. No rashes or lesions Neurologic: Alert and oriented X 3, normal strength and tone. Normal symmetric reflexes. Normal coordination and gait.     Assessment & Plan:   Problem List Items Addressed This Visit   None   I have discontinued Arville Lime. Paolo's predniSONE. I am also having her maintain her Lactobacillus (PROBIOTIC ACIDOPHILUS PO), telmisartan-hydrochlorothiazide, atorvastatin, and loratadine.  No orders of the defined types were placed  in this encounter.   Medications Discontinued During This Encounter  Medication Reason   predniSONE (DELTASONE) 10 MG tablet     Follow-up: No follow-ups on file.   Crecencio Mc, MD

## 2020-12-22 NOTE — Assessment & Plan Note (Signed)
She has gained weight  since last visit. I have encouraged her to continue to reduce her  BMI and encouraged  Continued weight loss with goal of 10% of body weight over the next 6 months using a low glycemic index diet and regular exercise a minimum of 5 days per week.

## 2020-12-23 MED ORDER — ATORVASTATIN CALCIUM 20 MG PO TABS: 20.0000 mg | ORAL_TABLET | Freq: Every day | ORAL | 1 refills | Status: DC

## 2020-12-23 MED ORDER — TELMISARTAN-HCTZ 40-12.5 MG PO TABS: 1.0000 | ORAL_TABLET | Freq: Every day | ORAL | 1 refills | Status: DC

## 2021-02-17 ENCOUNTER — Other Ambulatory Visit: Payer: Self-pay

## 2021-02-17 ENCOUNTER — Ambulatory Visit
Admission: RE | Admit: 2021-02-17 | Discharge: 2021-02-17 | Disposition: A | Discharge status: Home / Self Care | Payer: BC Managed Care – PPO | Source: Ambulatory Visit | Attending: Internal Medicine | Admitting: Internal Medicine

## 2021-02-17 DIAGNOSIS — Z1231 Encounter for screening mammogram for malignant neoplasm of breast: Secondary | ICD-10-CM | POA: Diagnosis not present

## 2021-03-10 DIAGNOSIS — E785 Hyperlipidemia, unspecified: Secondary | ICD-10-CM

## 2021-03-10 DIAGNOSIS — E1121 Type 2 diabetes mellitus with diabetic nephropathy: Secondary | ICD-10-CM

## 2021-03-10 DIAGNOSIS — E1169 Type 2 diabetes mellitus with other specified complication: Secondary | ICD-10-CM

## 2021-03-24 ENCOUNTER — Ambulatory Visit: Payer: BC Managed Care – PPO | Admitting: Internal Medicine

## 2021-04-28 ENCOUNTER — Other Ambulatory Visit: Payer: Self-pay

## 2021-04-28 ENCOUNTER — Encounter: Payer: Self-pay | Admitting: Internal Medicine

## 2021-04-28 ENCOUNTER — Other Ambulatory Visit (INDEPENDENT_AMBULATORY_CARE_PROVIDER_SITE_OTHER): Payer: BC Managed Care – PPO

## 2021-04-28 DIAGNOSIS — E1121 Type 2 diabetes mellitus with diabetic nephropathy: Secondary | ICD-10-CM | POA: Diagnosis not present

## 2021-04-28 DIAGNOSIS — E1169 Type 2 diabetes mellitus with other specified complication: Secondary | ICD-10-CM

## 2021-04-28 DIAGNOSIS — E785 Hyperlipidemia, unspecified: Secondary | ICD-10-CM

## 2021-04-28 LAB — LIPID PANEL
Cholesterol: 154 mg/dL (ref 0–200)
HDL: 35.2 mg/dL — ABNORMAL LOW (ref >39.00)
LDL Cholesterol: 85 mg/dL (ref 0–99)
NonHDL: 118.41
Total CHOL/HDL Ratio: 4
Triglycerides: 167 mg/dL — ABNORMAL HIGH (ref 0.0–149.0)
VLDL: 33.4 mg/dL (ref 0.0–40.0)

## 2021-04-28 LAB — COMPREHENSIVE METABOLIC PANEL
ALT: 18 U/L (ref 0–35)
AST: 18 U/L (ref 0–37)
Albumin: 4.3 g/dL (ref 3.5–5.2)
Alkaline Phosphatase: 108 U/L (ref 39–117)
BUN: 15 mg/dL (ref 6–23)
CO2: 28 mEq/L (ref 19–32)
Calcium: 9.3 mg/dL (ref 8.4–10.5)
Chloride: 101 mEq/L (ref 96–112)
Creatinine, Ser: 0.86 mg/dL (ref 0.40–1.20)
GFR: 73.26 mL/min (ref >60.00)
Glucose, Bld: 103 mg/dL — ABNORMAL HIGH (ref 70–99)
Potassium: 3.9 mEq/L (ref 3.5–5.1)
Sodium: 137 mEq/L (ref 135–145)
Total Bilirubin: 0.4 mg/dL (ref 0.2–1.2)
Total Protein: 6.7 g/dL (ref 6.0–8.3)

## 2021-04-28 LAB — HEMOGLOBIN A1C: Hgb A1c MFr Bld: 6.6 % — ABNORMAL HIGH (ref 4.6–6.5)

## 2021-04-30 ENCOUNTER — Encounter: Payer: Self-pay | Admitting: Internal Medicine

## 2021-04-30 ENCOUNTER — Ambulatory Visit: Payer: BC Managed Care – PPO | Admitting: Internal Medicine

## 2021-04-30 ENCOUNTER — Other Ambulatory Visit: Payer: Self-pay

## 2021-04-30 VITALS — BP 102/62 | HR 71 | Temp 97.7°F | Ht 63.0 in | Wt 190.2 lb

## 2021-04-30 DIAGNOSIS — Z9889 Other specified postprocedural states: Secondary | ICD-10-CM | POA: Diagnosis not present

## 2021-04-30 DIAGNOSIS — E1121 Type 2 diabetes mellitus with diabetic nephropathy: Secondary | ICD-10-CM

## 2021-04-30 DIAGNOSIS — I10 Essential (primary) hypertension: Secondary | ICD-10-CM

## 2021-04-30 NOTE — Progress Notes (Signed)
Subjective:  Patient ID: Nichole Jensen, female    DOB: Dec 23, 1960  Age: 61 y.o. MRN: [id number]  CC: The primary encounter diagnosis was Type II diabetes mellitus with nephropathy ([place]). Diagnoses of Essential hypertension and S/P evacuation of hematoma were also pertinent to this visit.  HPI Nichole Jensen presents for follow up on type 2 DM, hyperlipidemia, hypertension and obesity Chief Complaint  Patient presents with   Follow-up   This visit occurred during the SARS-CoV-2 public health emergency.  Safety protocols were in place, including screening questions prior to the visit, additional usage of staff PPE, and extensive cleaning of exam room while observing appropriate contact time as indicated for disinfecting solutions.   Since her last visit she has had surgery on right forearm in late October from the dog bite to evacuate an organized hematoma.  She has healed completely but continues to have some numbness over the site of the bite.   Type 2 DM with  obesity:   she is exercising 2-3 times per week.  Not taking medications.   weight gain discussed.  She is planning to start the Diabetes Weight loss "week by week"  book with a friend for accountability, and weighing daily.  Meal prepping   HTN:  Patient is taking her medications as prescribed and notes no adverse effects.  Home BP readings have been done about once per week and are  generally < 130/80 .  She is avoiding added salt in her diet and walking regularly about 3 times per week for exercise      Outpatient Medications Prior to Visit  Medication Sig Dispense Refill   atorvastatin (LIPITOR) 20 MG tablet Take 1 tablet (20 mg total) by mouth daily. 90 tablet 1   Lactobacillus (PROBIOTIC ACIDOPHILUS PO) Take 1 capsule by mouth daily.     loratadine (CLARITIN) 10 MG tablet Take 10 mg by mouth daily.     Multiple Vitamins-Minerals (MULTIVITAMIN WITH MINERALS) tablet Take 1 tablet by mouth daily.      telmisartan-hydrochlorothiazide (MICARDIS HCT) 40-12.5 MG tablet Take 1 tablet by mouth daily. 90 tablet 1   No facility-administered medications prior to visit.    Review of Systems;  Patient denies headache, fevers, malaise, unintentional weight loss, skin rash, eye pain, sinus congestion and sinus pain, sore throat, dysphagia,  hemoptysis , cough, dyspnea, wheezing, chest pain, palpitations, orthopnea, edema, abdominal pain, nausea, melena, diarrhea, constipation, flank pain, dysuria, hematuria, urinary  Frequency, nocturia, numbness, tingling, seizures,  Focal weakness, Loss of consciousness,  Tremor, insomnia, depression, anxiety, and suicidal ideation.      Objective:  BP 102/62 (BP Location: Left Arm, Patient Position: Sitting, Cuff Size: Large)    Pulse 71    Temp 97.7 F (36.5 C) (Oral)    Ht 5\' 3"  (1.6 m)    Wt 190 lb 3.2 oz (86.3 kg)    SpO2 98%    BMI 33.69 kg/m   BP Readings from Last 3 Encounters:  04/30/21 102/62  12/22/20 118/72  12/10/20 132/79    Wt Readings from Last 3 Encounters:  04/30/21 190 lb 3.2 oz (86.3 kg)  12/22/20 194 lb 12.8 oz (88.4 kg)  12/10/20 190 lb (86.2 kg)    General appearance: alert, cooperative and appears stated age Ears: normal TM's and external ear canals both ears Throat: lips, mucosa, and tongue normal; teeth and gums normal Neck: no adenopathy, no carotid bruit, supple, symmetrical, trachea midline and thyroid not enlarged, symmetric, no tenderness/mass/nodules Back: symmetric,  no curvature. ROM normal. No CVA tenderness. Lungs: clear to auscultation bilaterally Heart: regular rate and rhythm, S1, S2 normal, no murmur, click, rub or gallop Abdomen: soft, non-tender; bowel sounds normal; no masses,  no organomegaly Pulses: 2+ and symmetric Skin: Skin color, texture, turgor normal. No rashes or lesions Lymph nodes: Cervical, supraclavicular, and axillary nodes normal.  Lab Results  Component Value Date   HGBA1C 6.6 (H) 04/28/2021    HGBA1C 6.5 12/16/2020   HGBA1C 6.3 04/21/2020    Lab Results  Component Value Date   CREATININE 0.86 04/28/2021   CREATININE 0.90 12/16/2020   CREATININE 0.94 05/19/2020    Lab Results  Component Value Date   WBC 7.1 12/16/2020   HGB 13.2 12/16/2020   HCT 39.5 12/16/2020   PLT 186.0 12/16/2020   GLUCOSE 103 (H) 04/28/2021   CHOL 154 04/28/2021   TRIG 167.0 (H) 04/28/2021   HDL 35.20 (L) 04/28/2021   LDLDIRECT 124.0 12/28/2018   LDLCALC 85 04/28/2021   ALT 18 04/28/2021   AST 18 04/28/2021   NA 137 04/28/2021   K 3.9 04/28/2021   CL 101 04/28/2021   CREATININE 0.86 04/28/2021   BUN 15 04/28/2021   CO2 28 04/28/2021   TSH 4.55 12/16/2020   HGBA1C 6.6 (H) 04/28/2021   MICROALBUR <0.7 12/16/2020    MM 3D SCREEN BREAST BILATERAL  Result Date: 02/19/2021 CLINICAL DATA:  Screening. EXAM: DIGITAL SCREENING BILATERAL MAMMOGRAM WITH TOMOSYNTHESIS AND CAD TECHNIQUE: Bilateral screening digital craniocaudal and mediolateral oblique mammograms were obtained. Bilateral screening digital breast tomosynthesis was performed. The images were evaluated with computer-aided detection. COMPARISON:  Previous exam(s). ACR Breast Density Category b: There are scattered areas of fibroglandular density. FINDINGS: There are no findings suspicious for malignancy. IMPRESSION: No mammographic evidence of malignancy. A result letter of this screening mammogram will be mailed directly to the patient. RECOMMENDATION: Screening mammogram in one year. (Code:SM-B-01Y) BI-RADS CATEGORY  1: Negative. Electronically Signed   By: Evangeline Dakin M.D.   On: 02/19/2021 14:42   Assessment & Plan:   Problem List Items Addressed This Visit     Essential hypertension    Well controlled on current regimen. Renal function stable, no changes today.      Type II diabetes mellitus with nephropathy ([place]) - Primary    She remains opposed to starting medications for management of obesity and type 2 DM for the time  being and will return in 6 months for repeat assessment.  Advised to   continue ARB and statin   Lab Results  Component Value Date   HGBA1C 6.6 (H) 04/28/2021   Lab Results  Component Value Date   MICROALBUR <0.7 12/16/2020   MICROALBUR 1.5 04/21/2020     Lab Results  Component Value Date   CHOL 154 04/28/2021   HDL 35.20 (L) 04/28/2021   LDLCALC 85 04/28/2021   LDLDIRECT 124.0 12/28/2018   TRIG 167.0 (H) 04/28/2021   CHOLHDL 4 04/28/2021        Relevant Orders   Hemoglobin A1c   Lipid Panel w/reflex Direct LDL   Comprehensive metabolic panel   S/P evacuation of hematoma    Done in August by Dr Sabra Heck for management of sequelae of dog bite.       I am having Vivia Ewing maintain her Lactobacillus (PROBIOTIC ACIDOPHILUS PO), loratadine, atorvastatin, telmisartan-hydrochlorothiazide, and multivitamin with minerals.  No orders of the defined types were placed in this encounter.    I provided  30 minutes of  face-to-face time during this encounter reviewing patient's current problems and  recent surgery,   current and previous labs , s, providing counseling on the diet and exercise, and coordination  of care .   Follow-up: Return in about 6 months (around 10/28/2021) for follow up diabetes.   Crecencio Mc, MD

## 2021-05-02 NOTE — Assessment & Plan Note (Signed)
She remains opposed to starting medications for management of obesity and type 2 DM for the time being and will return in 6 months for repeat assessment.  Advised to   continue ARB and statin   Lab Results  Component Value Date   HGBA1C 6.6 (H) 04/28/2021   Lab Results  Component Value Date   MICROALBUR <0.7 12/16/2020   MICROALBUR 1.5 04/21/2020     Lab Results  Component Value Date   CHOL 154 04/28/2021   HDL 35.20 (L) 04/28/2021   LDLCALC 85 04/28/2021   LDLDIRECT 124.0 12/28/2018   TRIG 167.0 (H) 04/28/2021   CHOLHDL 4 04/28/2021

## 2021-05-02 NOTE — Assessment & Plan Note (Signed)
Well controlled on current regimen. Renal function stable, no changes today. 

## 2021-05-02 NOTE — Assessment & Plan Note (Signed)
Done in August by Dr Sabra Heck for management of sequelae of dog bite.

## 2021-06-15 LAB — HM DIABETES EYE EXAM

## 2021-08-20 ENCOUNTER — Encounter: Payer: Self-pay | Admitting: Internal Medicine

## 2021-08-24 ENCOUNTER — Encounter: Payer: Self-pay | Admitting: Internal Medicine

## 2021-08-24 ENCOUNTER — Other Ambulatory Visit: Payer: Self-pay | Admitting: Internal Medicine

## 2021-08-24 MED ORDER — TELMISARTAN-HCTZ 40-12.5 MG PO TABS: 1.0000 | ORAL_TABLET | Freq: Every day | ORAL | 1 refills | Status: DC

## 2021-10-30 ENCOUNTER — Ambulatory Visit: Payer: BC Managed Care – PPO | Admitting: Internal Medicine

## 2021-11-02 ENCOUNTER — Encounter: Payer: Self-pay | Admitting: Internal Medicine

## 2021-11-02 ENCOUNTER — Ambulatory Visit: Payer: BC Managed Care – PPO | Admitting: Internal Medicine

## 2021-11-02 VITALS — BP 110/60 | HR 67 | Temp 97.6°F | Ht 63.0 in | Wt 192.4 lb

## 2021-11-02 DIAGNOSIS — Z1231 Encounter for screening mammogram for malignant neoplasm of breast: Secondary | ICD-10-CM

## 2021-11-02 DIAGNOSIS — E1169 Type 2 diabetes mellitus with other specified complication: Secondary | ICD-10-CM | POA: Diagnosis not present

## 2021-11-02 DIAGNOSIS — E1121 Type 2 diabetes mellitus with diabetic nephropathy: Secondary | ICD-10-CM

## 2021-11-02 DIAGNOSIS — I10 Essential (primary) hypertension: Secondary | ICD-10-CM | POA: Diagnosis not present

## 2021-11-02 DIAGNOSIS — E669 Obesity, unspecified: Secondary | ICD-10-CM

## 2021-11-02 DIAGNOSIS — E785 Hyperlipidemia, unspecified: Secondary | ICD-10-CM

## 2021-11-02 LAB — POCT GLYCOSYLATED HEMOGLOBIN (HGB A1C): Hemoglobin A1C: 5.8 % — AB (ref 4.0–5.6)

## 2021-11-02 LAB — COMPREHENSIVE METABOLIC PANEL
ALT: 15 U/L (ref 0–35)
AST: 16 U/L (ref 0–37)
Albumin: 4.7 g/dL (ref 3.5–5.2)
Alkaline Phosphatase: 91 U/L (ref 39–117)
BUN: 14 mg/dL (ref 6–23)
CO2: 26 mEq/L (ref 19–32)
Calcium: 9.7 mg/dL (ref 8.4–10.5)
Chloride: 103 mEq/L (ref 96–112)
Creatinine, Ser: 0.87 mg/dL (ref 0.40–1.20)
GFR: 71.99 mL/min (ref >60.00)
Glucose, Bld: 107 mg/dL — ABNORMAL HIGH (ref 70–99)
Potassium: 4.4 mEq/L (ref 3.5–5.1)
Sodium: 138 mEq/L (ref 135–145)
Total Bilirubin: 0.5 mg/dL (ref 0.2–1.2)
Total Protein: 6.9 g/dL (ref 6.0–8.3)

## 2021-11-02 LAB — MICROALBUMIN / CREATININE URINE RATIO
Creatinine,U: 127.5 mg/dL
Microalb Creat Ratio: 2.5 mg/g (ref 0.0–30.0)
Microalb, Ur: 3.2 mg/dL — ABNORMAL HIGH (ref 0.0–1.9)

## 2021-11-02 NOTE — Assessment & Plan Note (Addendum)
Well controlled on diet alone . Renal function stable, no changes today. She IS tolerating statin therapy aND arbRB  Lab Results  Component Value Date   HGBA1C 5.8 (A) 11/02/2021   Lab Results  Component Value Date   MICROALBUR 3.2 (H) 11/02/2021   MICROALBUR <0.7 12/16/2020

## 2021-11-02 NOTE — Patient Instructions (Signed)
   [employer] "low carb power bowl"  entrees and  "Steamer" entrees are are great low carb entrees that microwave in 5 minutes       See you in 3 months

## 2021-11-02 NOTE — Assessment & Plan Note (Signed)
She has gained weight  since last visit.  She has been eating out every meal due to lack of kitchen .  Patient has a family history of thyroid cancer ,  Which may be a contraindication to using Ozempic  .  She will continue to work on diet

## 2021-11-02 NOTE — Assessment & Plan Note (Signed)
Well controlled on current regimen of telmisartan hct . Renal function stable, no changes today.  Lab Results  Component Value Date   CREATININE 0.87 11/02/2021   Lab Results  Component Value Date   NA 138 11/02/2021   K 4.4 11/02/2021   CL 103 11/02/2021   CO2 26 11/02/2021

## 2021-11-02 NOTE — Progress Notes (Signed)
Subjective:  Patient ID: Nichole Jensen, female    DOB: 12-29-1960  Age: 61 y.o. MRN: [id number]  CC: The primary encounter diagnosis was Essential hypertension. Diagnoses of Type II diabetes mellitus with nephropathy ([place]), Hyperlipidemia associated with type 2 diabetes mellitus ([place]), Encounter for screening mammogram for malignant neoplasm of breast, and Obesity (BMI 30-39.9) were also pertinent to this visit.   HPI Nichole Jensen presents for  Chief Complaint  Patient presents with   Follow-up    6 month follow up on diabetes and hypertension    1) Type 2 Diabetes Mellitus:  patient has been struggling to follow a sensible diet but has been without a working kitchen (including sink) for over a month, so she has been eating out for the last 4 weeks.  She lost weight after last visit following a diabetic diet and was walking regularly with a friend,  but regained weight .  Diet and exercise reviewed in detail.   2) Hypertension: patient checks blood pressure twice weekly at home.  Readings have been for the most part  < 130/80 at rest . Patient is following a reduce salt diet most days and is taking medications as prescribed   Outpatient Medications Prior to Visit  Medication Sig Dispense Refill   atorvastatin (LIPITOR) 20 MG tablet Take 1 tablet (20 mg total) by mouth daily. 90 tablet 1   Lactobacillus (PROBIOTIC ACIDOPHILUS PO) Take 1 capsule by mouth daily.     loratadine (CLARITIN) 10 MG tablet Take 10 mg by mouth daily.     Multiple Vitamins-Minerals (MULTIVITAMIN WITH MINERALS) tablet Take 1 tablet by mouth daily.     telmisartan-hydrochlorothiazide (MICARDIS HCT) 40-12.5 MG tablet Take 1 tablet by mouth daily. 90 tablet 1   No facility-administered medications prior to visit.    Review of Systems;  Patient denies headache, fevers, malaise, unintentional weight loss, skin rash, eye pain, sinus congestion and sinus pain, sore throat, dysphagia,  hemoptysis , cough,  dyspnea, wheezing, chest pain, palpitations, orthopnea, edema, abdominal pain, nausea, melena, diarrhea, constipation, flank pain, dysuria, hematuria, urinary  Frequency, nocturia, numbness, tingling, seizures,  Focal weakness, Loss of consciousness,  Tremor, insomnia, depression, anxiety, and suicidal ideation.      Objective:  BP 110/60 (BP Location: Left Arm, Patient Position: Sitting, Cuff Size: Normal)   Pulse 67   Temp 97.6 F (36.4 C) (Oral)   Ht '5\' 3"'$  (1.6 m)   Wt 192 lb 6.4 oz (87.3 kg)   SpO2 97%   BMI 34.08 kg/m   BP Readings from Last 3 Encounters:  11/02/21 110/60  04/30/21 102/62  12/22/20 118/72    Wt Readings from Last 3 Encounters:  11/02/21 192 lb 6.4 oz (87.3 kg)  04/30/21 190 lb 3.2 oz (86.3 kg)  12/22/20 194 lb 12.8 oz (88.4 kg)    General appearance: alert, cooperative and appears stated age Ears: normal TM's and external ear canals both ears Throat: lips, mucosa, and tongue normal; teeth and gums normal Neck: no adenopathy, no carotid bruit, supple, symmetrical, trachea midline and thyroid not enlarged, symmetric, no tenderness/mass/nodules Back: symmetric, no curvature. ROM normal. No CVA tenderness. Lungs: clear to auscultation bilaterally Heart: regular rate and rhythm, S1, S2 normal, no murmur, click, rub or gallop Abdomen: soft, non-tender; bowel sounds normal; no masses,  no organomegaly Pulses: 2+ and symmetric Skin: Skin color, texture, turgor normal. No rashes or lesions Lymph nodes: Cervical, supraclavicular, and axillary nodes normal.  Lab Results  Component Value Date  HGBA1C 5.8 (A) 11/02/2021   HGBA1C 6.6 (H) 04/28/2021   HGBA1C 6.5 12/16/2020    Lab Results  Component Value Date   CREATININE 0.87 11/02/2021   CREATININE 0.86 04/28/2021   CREATININE 0.90 12/16/2020    Lab Results  Component Value Date   WBC 7.1 12/16/2020   HGB 13.2 12/16/2020   HCT 39.5 12/16/2020   PLT 186.0 12/16/2020   GLUCOSE 107 (H) 11/02/2021    CHOL 154 04/28/2021   TRIG 167.0 (H) 04/28/2021   HDL 35.20 (L) 04/28/2021   LDLDIRECT 124.0 12/28/2018   LDLCALC 85 04/28/2021   ALT 15 11/02/2021   AST 16 11/02/2021   NA 138 11/02/2021   K 4.4 11/02/2021   CL 103 11/02/2021   CREATININE 0.87 11/02/2021   BUN 14 11/02/2021   CO2 26 11/02/2021   TSH 4.55 12/16/2020   HGBA1C 5.8 (A) 11/02/2021   MICROALBUR 3.2 (H) 11/02/2021    MM 3D SCREEN BREAST BILATERAL  Result Date: 02/19/2021 CLINICAL DATA:  Screening. EXAM: DIGITAL SCREENING BILATERAL MAMMOGRAM WITH TOMOSYNTHESIS AND CAD TECHNIQUE: Bilateral screening digital craniocaudal and mediolateral oblique mammograms were obtained. Bilateral screening digital breast tomosynthesis was performed. The images were evaluated with computer-aided detection. COMPARISON:  Previous exam(s). ACR Breast Density Category b: There are scattered areas of fibroglandular density. FINDINGS: There are no findings suspicious for malignancy. IMPRESSION: No mammographic evidence of malignancy. A result letter of this screening mammogram will be mailed directly to the patient. RECOMMENDATION: Screening mammogram in one year. (Code:SM-B-01Y) BI-RADS CATEGORY  1: Negative. Electronically Signed   By: Evangeline Dakin M.D.   On: 02/19/2021 14:42   Assessment & Plan:   Problem List Items Addressed This Visit     Type II diabetes mellitus with nephropathy ([place])    Well controlled on diet alone . Renal function stable, no changes today. She IS tolerating statin therapy aND arbRB  Lab Results  Component Value Date   HGBA1C 5.8 (A) 11/02/2021   Lab Results  Component Value Date   MICROALBUR 3.2 (H) 11/02/2021   MICROALBUR <0.7 12/16/2020           Relevant Orders   Urine Microalbumin w/creat. ratio (Completed)   POCT HgB A1C (Completed)   Hemoglobin A1c   Comprehensive metabolic panel   Lipid panel   Hyperlipidemia associated with type 2 diabetes mellitus ([facility])   Obesity (BMI 30-39.9)    She has  gained weight  since last visit.  She has been eating out every meal due to lack of kitchen .  Patient has a family history of thyroid cancer ,  Which may be a contraindication to using Ozempic  .  She will continue to work on diet        Essential hypertension - Primary    Well controlled on current regimen of telmisartan hct . Renal function stable, no changes today.  Lab Results  Component Value Date   CREATININE 0.87 11/02/2021   Lab Results  Component Value Date   NA 138 11/02/2021   K 4.4 11/02/2021   CL 103 11/02/2021   CO2 26 11/02/2021         Relevant Orders   Urine Microalbumin w/creat. ratio (Completed)   Comprehensive metabolic panel (Completed)   Other Visit Diagnoses     Encounter for screening mammogram for malignant neoplasm of breast       Relevant Orders   MM 3D SCREEN BREAST BILATERAL       I spent a total  of  32 minutes with this patient in a face to face visit on the date of this encounter reviewing the last office visit with me in December ,  counselling patient in her dietary choices and eating habits, home blood pressure readings ,  most recent imaging study ,   and post visit ordering of testing and therapeutics.    Follow-up: Return in about 3 months (around 02/02/2022) for follow up diabetes.   Crecencio Mc, MD

## 2021-11-04 ENCOUNTER — Telehealth: Payer: Self-pay

## 2021-11-04 NOTE — Telephone Encounter (Signed)
Left message letting pt know that forms are complete and ready to be picked up.

## 2021-11-20 ENCOUNTER — Encounter: Payer: Self-pay | Admitting: Internal Medicine

## 2021-11-23 ENCOUNTER — Other Ambulatory Visit: Payer: Self-pay | Admitting: Internal Medicine

## 2021-11-23 MED ORDER — TELMISARTAN-HCTZ 40-12.5 MG PO TABS: 1.0000 | ORAL_TABLET | Freq: Every day | ORAL | 1 refills | Status: DC

## 2022-02-02 ENCOUNTER — Other Ambulatory Visit (INDEPENDENT_AMBULATORY_CARE_PROVIDER_SITE_OTHER): Payer: BC Managed Care – PPO

## 2022-02-02 ENCOUNTER — Ambulatory Visit: Payer: BC Managed Care – PPO | Admitting: Internal Medicine

## 2022-02-02 DIAGNOSIS — E1121 Type 2 diabetes mellitus with diabetic nephropathy: Secondary | ICD-10-CM | POA: Diagnosis not present

## 2022-02-02 LAB — COMPREHENSIVE METABOLIC PANEL
ALT: 14 U/L (ref 0–35)
AST: 15 U/L (ref 0–37)
Albumin: 4.3 g/dL (ref 3.5–5.2)
Alkaline Phosphatase: 94 U/L (ref 39–117)
BUN: 24 mg/dL — ABNORMAL HIGH (ref 6–23)
CO2: 26 mEq/L (ref 19–32)
Calcium: 9.6 mg/dL (ref 8.4–10.5)
Chloride: 102 mEq/L (ref 96–112)
Creatinine, Ser: 0.91 mg/dL (ref 0.40–1.20)
GFR: 68.09 mL/min (ref >60.00)
Glucose, Bld: 103 mg/dL — ABNORMAL HIGH (ref 70–99)
Potassium: 4 mEq/L (ref 3.5–5.1)
Sodium: 137 mEq/L (ref 135–145)
Total Bilirubin: 0.5 mg/dL (ref 0.2–1.2)
Total Protein: 6.8 g/dL (ref 6.0–8.3)

## 2022-02-02 LAB — LIPID PANEL
Cholesterol: 183 mg/dL (ref 0–200)
HDL: 48.9 mg/dL (ref >39.00)
LDL Cholesterol: 108 mg/dL — ABNORMAL HIGH (ref 0–99)
NonHDL: 134.43
Total CHOL/HDL Ratio: 4
Triglycerides: 132 mg/dL (ref 0.0–149.0)
VLDL: 26.4 mg/dL (ref 0.0–40.0)

## 2022-02-02 LAB — HEMOGLOBIN A1C: Hgb A1c MFr Bld: 6.3 % (ref 4.6–6.5)

## 2022-02-10 ENCOUNTER — Encounter: Payer: Self-pay | Admitting: Internal Medicine

## 2022-02-10 ENCOUNTER — Ambulatory Visit: Payer: BC Managed Care – PPO | Admitting: Internal Medicine

## 2022-02-10 DIAGNOSIS — I10 Essential (primary) hypertension: Secondary | ICD-10-CM | POA: Diagnosis not present

## 2022-02-10 DIAGNOSIS — E1121 Type 2 diabetes mellitus with diabetic nephropathy: Secondary | ICD-10-CM

## 2022-02-10 NOTE — Progress Notes (Unsigned)
Subjective:  Patient ID: Nichole Jensen, female    DOB: 09-02-1960  Age: 61 y.o. MRN: [id number]  CC: Diagnoses of Type II diabetes mellitus with nephropathy ([place]) and Essential hypertension were pertinent to this visit.   HPI [name] presents for  Chief Complaint  Patient presents with   Follow-up    Diabetes, denies any concerns or pain    1) Type 2 DM,  obesity and hypertension:  She  feels generally well,  is  exercising regularly  (3 times with a trainer,  walking on the other days for 3 miles) and  losing weight gradually .  Tends to skip lunch but eating a low carb breakfast.  Retains fluid if she eats bread.  Gives herself one cheat day a week for bread.  Has cut back on cheese. Checking  blood sugars less than once daily at variable times, usually only if she feels she may be having a hypoglycemic event. .  BS have been under 130 fasting and < 150 post prandially.  Denies any recent hypoglyemic events.  Taking   medications as directed. Following a carbohydrate modified diet 6 days per week. Denies numbness, burning and tingling of extremities. Appetite is good.  taking ARB and statin.      Outpatient Medications Prior to Visit  Medication Sig Dispense Refill   atorvastatin (LIPITOR) 20 MG tablet Take 1 tablet (20 mg total) by mouth daily. 90 tablet 1   Lactobacillus (PROBIOTIC ACIDOPHILUS PO) Take 1 capsule by mouth daily.     loratadine (CLARITIN) 10 MG tablet Take 10 mg by mouth daily.     Multiple Vitamins-Minerals (MULTIVITAMIN WITH MINERALS) tablet Take 1 tablet by mouth daily.     telmisartan-hydrochlorothiazide (MICARDIS HCT) 40-12.5 MG tablet Take 1 tablet by mouth daily. 90 tablet 1   No facility-administered medications prior to visit.    Review of Systems;  Patient denies headache, fevers, malaise, unintentional weight loss, skin rash, eye pain, sinus congestion and sinus pain, sore throat, dysphagia,  hemoptysis , cough, dyspnea, wheezing, chest  pain, palpitations, orthopnea, edema, abdominal pain, nausea, melena, diarrhea, constipation, flank pain, dysuria, hematuria, urinary  Frequency, nocturia, numbness, tingling, seizures,  Focal weakness, Loss of consciousness,  Tremor, insomnia, depression, anxiety, and suicidal ideation.      Objective:  BP 116/74 (BP Location: Left Arm, Patient Position: Sitting, Cuff Size: Normal)   Pulse 68   Temp 97.8 F (36.6 C) (Oral)   Resp 14   Ht '5\' 3"'$  (1.6 m)   Wt 189 lb 9.6 oz (86 kg)   SpO2 97%   BMI 33.59 kg/m   BP Readings from Last 3 Encounters:  02/10/22 116/74  11/02/21 110/60  04/30/21 102/62    Wt Readings from Last 3 Encounters:  02/10/22 189 lb 9.6 oz (86 kg)  11/02/21 192 lb 6.4 oz (87.3 kg)  04/30/21 190 lb 3.2 oz (86.3 kg)    General appearance: alert, cooperative and appears stated age Ears: normal TM's and external ear canals both ears Throat: lips, mucosa, and tongue normal; teeth and gums normal Neck: no adenopathy, no carotid bruit, supple, symmetrical, trachea midline and thyroid not enlarged, symmetric, no tenderness/mass/nodules Back: symmetric, no curvature. ROM normal. No CVA tenderness. Lungs: clear to auscultation bilaterally Heart: regular rate and rhythm, S1, S2 normal, no murmur, click, rub or gallop Abdomen: soft, non-tender; bowel sounds normal; no masses,  no organomegaly Pulses: 2+ and symmetric Skin: Skin color, texture, turgor normal. No rashes or lesions  Lymph nodes: Cervical, supraclavicular, and axillary nodes normal. Neuro:  awake and interactive with normal mood and affect. Higher cortical functions are normal. Speech is clear without word-finding difficulty or dysarthria. Extraocular movements are intact. Visual fields of both eyes are grossly intact. Sensation to light touch is grossly intact bilaterally of upper and lower extremities. Motor examination shows 4+/5 symmetric hand grip and upper extremity and 5/5 lower extremity strength. There  is no pronation or drift. Gait is non-ataxic   Lab Results  Component Value Date   HGBA1C 6.3 02/02/2022   HGBA1C 5.8 (A) 11/02/2021   HGBA1C 6.6 (H) 04/28/2021    Lab Results  Component Value Date   CREATININE 0.91 02/02/2022   CREATININE 0.87 11/02/2021   CREATININE 0.86 04/28/2021    Lab Results  Component Value Date   WBC 7.1 12/16/2020   HGB 13.2 12/16/2020   HCT 39.5 12/16/2020   PLT 186.0 12/16/2020   GLUCOSE 103 (H) 02/02/2022   CHOL 183 02/02/2022   TRIG 132.0 02/02/2022   HDL 48.90 02/02/2022   LDLDIRECT 124.0 12/28/2018   LDLCALC 108 (H) 02/02/2022   ALT 14 02/02/2022   AST 15 02/02/2022   NA 137 02/02/2022   K 4.0 02/02/2022   CL 102 02/02/2022   CREATININE 0.91 02/02/2022   BUN 24 (H) 02/02/2022   CO2 26 02/02/2022   TSH 4.55 12/16/2020   HGBA1C 6.3 02/02/2022   MICROALBUR 3.2 (H) 11/02/2021    MM 3D SCREEN BREAST BILATERAL  Result Date: 02/19/2021 CLINICAL DATA:  Screening. EXAM: DIGITAL SCREENING BILATERAL MAMMOGRAM WITH TOMOSYNTHESIS AND CAD TECHNIQUE: Bilateral screening digital craniocaudal and mediolateral oblique mammograms were obtained. Bilateral screening digital breast tomosynthesis was performed. The images were evaluated with computer-aided detection. COMPARISON:  Previous exam(s). ACR Breast Density Category b: There are scattered areas of fibroglandular density. FINDINGS: There are no findings suspicious for malignancy. IMPRESSION: No mammographic evidence of malignancy. A result letter of this screening mammogram will be mailed directly to the patient. RECOMMENDATION: Screening mammogram in one year. (Code:SM-B-01Y) BI-RADS CATEGORY  1: Negative. Electronically Signed   By: Evangeline Dakin M.D.   On: 02/19/2021 14:42   Assessment & Plan:   Problem List Items Addressed This Visit     Essential hypertension   Type II diabetes mellitus with nephropathy ([place])    I spent a total of   minutes with this patient in a face to face visit on  the date of this encounter reviewing the last office visit with me in       ,  most recent visit with cardiology ,    ,  patient's diet and exercise habits, home blood pressure /blod sugar readings, recent ER visit including labs and imaging studies ,   and post visit ordering of testing and therapeutics.    Follow-up: No follow-ups on file.   Crecencio Mc, MD

## 2022-02-11 NOTE — Assessment & Plan Note (Signed)
Well controlled on diet alone . Renal function stable, no changes today. She is tolerating statin therapy and an ARB for management of microalbuminuria/   Lab Results  Component Value Date   HGBA1C 6.3 02/02/2022   Lab Results  Component Value Date   MICROALBUR 3.2 (H) 11/02/2021   MICROALBUR <0.7 12/16/2020

## 2022-02-18 ENCOUNTER — Ambulatory Visit
Admission: RE | Admit: 2022-02-18 | Discharge: 2022-02-18 | Disposition: A | Discharge status: Home / Self Care | Payer: BC Managed Care – PPO | Source: Ambulatory Visit | Attending: Internal Medicine | Admitting: Internal Medicine

## 2022-02-18 DIAGNOSIS — Z1231 Encounter for screening mammogram for malignant neoplasm of breast: Secondary | ICD-10-CM | POA: Diagnosis present

## 2022-04-30 ENCOUNTER — Other Ambulatory Visit: Payer: Self-pay | Admitting: Internal Medicine

## 2022-05-03 NOTE — Telephone Encounter (Signed)
Not in current medication list  Last OV: 02/10/2022 Next OV: 08/12/2022

## 2022-05-05 ENCOUNTER — Other Ambulatory Visit: Payer: Self-pay | Admitting: Internal Medicine

## 2022-05-05 ENCOUNTER — Encounter: Payer: Self-pay | Admitting: Internal Medicine

## 2022-05-05 MED ORDER — TELMISARTAN-HCTZ 40-12.5 MG PO TABS: 1.0000 | ORAL_TABLET | Freq: Every day | ORAL | 1 refills | Status: DC

## 2022-05-05 NOTE — Telephone Encounter (Signed)
Telmisartan-hctz has been refilled.

## 2022-06-16 LAB — HM DIABETES EYE EXAM

## 2022-08-09 ENCOUNTER — Other Ambulatory Visit (INDEPENDENT_AMBULATORY_CARE_PROVIDER_SITE_OTHER): Payer: BC Managed Care – PPO

## 2022-08-09 DIAGNOSIS — E1121 Type 2 diabetes mellitus with diabetic nephropathy: Secondary | ICD-10-CM | POA: Diagnosis not present

## 2022-08-09 DIAGNOSIS — I10 Essential (primary) hypertension: Secondary | ICD-10-CM

## 2022-08-09 LAB — LIPID PANEL
Cholesterol: 181 mg/dL (ref 0–200)
HDL: 49.4 mg/dL (ref >39.00)
LDL Cholesterol: 106 mg/dL — ABNORMAL HIGH (ref 0–99)
NonHDL: 131.14
Total CHOL/HDL Ratio: 4
Triglycerides: 126 mg/dL (ref 0.0–149.0)
VLDL: 25.2 mg/dL (ref 0.0–40.0)

## 2022-08-09 LAB — COMPREHENSIVE METABOLIC PANEL
ALT: 15 U/L (ref 0–35)
AST: 15 U/L (ref 0–37)
Albumin: 4.5 g/dL (ref 3.5–5.2)
Alkaline Phosphatase: 93 U/L (ref 39–117)
BUN: 20 mg/dL (ref 6–23)
CO2: 26 mEq/L (ref 19–32)
Calcium: 9.4 mg/dL (ref 8.4–10.5)
Chloride: 101 mEq/L (ref 96–112)
Creatinine, Ser: 0.98 mg/dL (ref 0.40–1.20)
GFR: 62.07 mL/min (ref >60.00)
Glucose, Bld: 107 mg/dL — ABNORMAL HIGH (ref 70–99)
Potassium: 4.1 mEq/L (ref 3.5–5.1)
Sodium: 137 mEq/L (ref 135–145)
Total Bilirubin: 0.4 mg/dL (ref 0.2–1.2)
Total Protein: 6.7 g/dL (ref 6.0–8.3)

## 2022-08-09 LAB — MICROALBUMIN / CREATININE URINE RATIO
Creatinine,U: 169.7 mg/dL
Microalb Creat Ratio: 1.3 mg/g (ref 0.0–30.0)
Microalb, Ur: 2.3 mg/dL — ABNORMAL HIGH (ref 0.0–1.9)

## 2022-08-09 LAB — HEMOGLOBIN A1C: Hgb A1c MFr Bld: 6.4 % (ref 4.6–6.5)

## 2022-08-12 ENCOUNTER — Encounter: Payer: Self-pay | Admitting: Internal Medicine

## 2022-08-12 ENCOUNTER — Ambulatory Visit (INDEPENDENT_AMBULATORY_CARE_PROVIDER_SITE_OTHER): Payer: BC Managed Care – PPO | Admitting: Internal Medicine

## 2022-08-12 VITALS — BP 108/78 | HR 80 | Temp 98.2°F | Ht 63.0 in | Wt 187.8 lb

## 2022-08-12 DIAGNOSIS — Z79899 Other long term (current) drug therapy: Secondary | ICD-10-CM

## 2022-08-12 DIAGNOSIS — Z1211 Encounter for screening for malignant neoplasm of colon: Secondary | ICD-10-CM

## 2022-08-12 DIAGNOSIS — E1121 Type 2 diabetes mellitus with diabetic nephropathy: Secondary | ICD-10-CM

## 2022-08-12 DIAGNOSIS — I10 Essential (primary) hypertension: Secondary | ICD-10-CM

## 2022-08-12 DIAGNOSIS — E1169 Type 2 diabetes mellitus with other specified complication: Secondary | ICD-10-CM | POA: Diagnosis not present

## 2022-08-12 DIAGNOSIS — E785 Hyperlipidemia, unspecified: Secondary | ICD-10-CM

## 2022-08-12 MED ORDER — TELMISARTAN-HCTZ 40-12.5 MG PO TABS: 1.0000 | ORAL_TABLET | Freq: Every day | ORAL | 1 refills | Status: DC

## 2022-08-12 MED ORDER — ATORVASTATIN CALCIUM 20 MG PO TABS: 20.0000 mg | ORAL_TABLET | Freq: Every day | ORAL | 1 refills | Status: DC

## 2022-08-12 MED ORDER — DOXYCYCLINE HYCLATE 100 MG PO TABS: 100.0000 mg | ORAL_TABLET | Freq: Two times a day (BID) | ORAL | 0 refills | Status: DC

## 2022-08-12 NOTE — Progress Notes (Signed)
Subjective:  Patient ID: Nichole Jensen, female    DOB: 1961/03/26  Age: 62 y.o. MRN: [id number]  CC: The primary encounter diagnosis was Essential hypertension. Diagnoses of Type II diabetes mellitus with nephropathy, Hyperlipidemia associated with type 2 diabetes mellitus, Long-term use of high-risk medication, and Colon cancer screening were also pertinent to this visit.   HPI Nichole Jensen presents for  Chief Complaint  Patient presents with   Medical Management of Chronic Issues   Nichole Jensen is  a 62 yr old female with a history of diet controlled diabetes, obesity and hypertension who is here for follow up  Had a COVID infection in October , mild .  presented as allergies .   Social :  Has been overextended herself and became overwhelmed :  finally finished caring for a friend who needed assistance in recovering from back surgery , was also housing another friend who sold her house.  Finally Escaped to [place] to the family farm and  where she is helping her sister  care for their now 67 month old nephew who was born prematurely due to preeclampsia with bowel perforation and now has short gut syndrome.  She is happy.  Adjusting to retirement.  Active at church.  Time management has been challenging due to overextending herself.   Obesity:  reached 176 lbs  between visits using, meal prepping and , but re due to houseguests  bringing in food.  Now Back on track.  Making her own high protein bread , limiting her intake to one slice daily.   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    Type 2 DM with microalbuminuria:  taking telmisartan  and atorvastatin.  BP 108  without orthostasis .  Low gi diet.  Exercising.  .  BS have been under 130 fasting and < 150 post prandially.  Denies any recent hypoglyemic events.  llowing a carbohydrate modified diet 6 days per week. Denies numbness, burning and tingling of extremities.  Appetite is good.        Outpatient Medications Prior to Visit  Medication Sig Dispense Refill   loratadine (CLARITIN) 10 MG tablet Take 10 mg by mouth daily.     Multiple Vitamins-Minerals (MULTIVITAMIN WITH MINERALS) tablet Take 1 tablet by mouth daily.     atorvastatin (LIPITOR) 20 MG tablet Take 1 tablet (20 mg total) by mouth daily. 90 tablet 1   Lactobacillus (PROBIOTIC ACIDOPHILUS PO) Take 1 capsule by mouth daily. (Patient not taking: Reported on 08/12/2022)     telmisartan-hydrochlorothiazide (MICARDIS HCT) 40-12.5 MG tablet Take 1 tablet by mouth daily. 90 tablet 1   No facility-administered medications prior to visit.    Review of Systems;  Patient denies headache, fevers, malaise, unintentional weight loss, skin rash, eye pain, sinus congestion and sinus pain, sore throat, dysphagia,  hemoptysis , cough, dyspnea, wheezing, chest pain, palpitations, orthopnea, edema, abdominal pain, nausea, melena, diarrhea, constipation, flank pain, dysuria, hematuria, urinary  Frequency, nocturia, numbness, tingling, seizures,  Focal weakness, Loss of consciousness,  Tremor, insomnia, depression, anxiety, and suicidal ideation.      Objective:  BP 108/78   Pulse 80   Temp 98.2 F (36.8 C) (Oral)   Ht  (1.6 m)   Wt 187 lb 12.8 oz (85.2 kg)   SpO2 96%   BMI 33.27 kg/m   BP Readings from Last 3 Encounters:  08/12/22 108/78  02/10/22 116/74  11/02/21 110/60    Wt Readings from Last 3 Encounters:  08/12/22 187 lb 12.8 oz (85.2 kg)  02/10/22 189 lb 9.6 oz (86 kg)  11/02/21 192 lb 6.4 oz (87.3 kg)    Physical Exam Vitals reviewed.  Constitutional:      General: She is not in acute distress.    Appearance: Normal appearance. She is normal weight. She is not ill-appearing, toxic-appearing or diaphoretic.  HENT:     Head: Normocephalic.  Eyes:     General: No scleral icterus.       Right eye: No discharge.        Left eye: No discharge.     Conjunctiva/sclera:  Conjunctivae normal.  Cardiovascular:     Rate and Rhythm: Normal rate and regular rhythm.     Heart sounds: Normal heart sounds.  Pulmonary:     Effort: Pulmonary effort is normal. No respiratory distress.     Breath sounds: Normal breath sounds.  Musculoskeletal:        General: Normal range of motion.  Skin:    General: Skin is warm and dry.  Neurological:     General: No focal deficit present.     Mental Status: She is alert and oriented to person, place, and time. Mental status is at baseline.  Psychiatric:        Mood and Affect: Mood normal.        Behavior: Behavior normal.        Thought Content: Thought content normal.        Judgment: Judgment normal.     Lab Results  Component Value Date   HGBA1C 6.4 08/09/2022   HGBA1C 6.3 02/02/2022   HGBA1C 5.8 (A) 11/02/2021    Lab Results  Component Value Date   CREATININE 0.98 08/09/2022   CREATININE 0.91 02/02/2022   CREATININE 0.87 11/02/2021    Lab Results  Component Value Date   WBC 7.1 12/16/2020   HGB 13.2 12/16/2020   HCT 39.5 12/16/2020   PLT 186.0 12/16/2020   GLUCOSE 107 (H) 08/09/2022   CHOL 181 08/09/2022   TRIG 126.0 08/09/2022   HDL 49.40 08/09/2022   LDLDIRECT 124.0 12/28/2018   LDLCALC 106 (H) 08/09/2022   ALT 15 08/09/2022   AST 15 08/09/2022   NA 137 08/09/2022   K 4.1 08/09/2022   CL 101 08/09/2022   CREATININE 0.98 08/09/2022   BUN 20 08/09/2022   CO2 26 08/09/2022   TSH 4.55 12/16/2020   HGBA1C 6.4 08/09/2022   MICROALBUR 2.3 (H) 08/09/2022    MM 3D SCREEN BREAST BILATERAL  Result Date: 02/19/2022 CLINICAL DATA:  Screening. EXAM: DIGITAL SCREENING BILATERAL MAMMOGRAM WITH TOMOSYNTHESIS AND CAD TECHNIQUE: Bilateral screening digital craniocaudal and mediolateral oblique mammograms were obtained. Bilateral screening digital breast tomosynthesis was performed. The images were evaluated with computer-aided detection. COMPARISON:  Previous exam(s). ACR Breast Density Category b: There  are scattered areas of fibroglandular density. FINDINGS: There are no findings suspicious for malignancy. IMPRESSION: No mammographic evidence of malignancy. A result letter of this screening mammogram will be mailed directly to the patient. RECOMMENDATION: Screening mammogram in one year. (Code:SM-B-01Y) BI-RADS CATEGORY  1: Negative. Electronically Signed   By: Frederico Hamman M.D.   On: 02/19/2022 15:07    Assessment & Plan:  .Essential hypertension Assessment & Plan: Well controlled on current regimen of telmisartan hct . Renal function stable, no changes today.  Lab Results  Component Value Date   CREATININE 0.98 08/09/2022   Lab Results  Component Value Date   NA 137 08/09/2022   K 4.1 08/09/2022   CL 101 08/09/2022   CO2 26 08/09/2022     Orders: -     Comprehensive metabolic panel; Future  Type II diabetes mellitus with nephropathy Assessment & Plan: Diagnosed in August 2022 with A1c of 6.5  she has been well controlled on diet alone . Renal function stable, no changes today. 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   Lab Results  Component Value Date   HGBA1C 6.4 08/09/2022   Lab Results  Component Value Date   MICROALBUR 2.3 (H) 08/09/2022   MICROALBUR 3.2 (H) 11/02/2021       Orders: -     Comprehensive metabolic panel; Future -     Hemoglobin A1c; Future  Hyperlipidemia associated with type 2 diabetes mellitus -     Lipid panel; Future -     LDL cholesterol, direct; Future  Long-term use of high-risk medication -     TSH; Future -     CBC with Differential/Platelet; Future  Colon cancer screening -     Ambulatory referral to Gastroenterology  Other orders -     Atorvastatin Calcium; Take 1 tablet (20 mg total) by mouth daily.  Dispense: 90 tablet; Refill: 1 -     Telmisartan-HCTZ; Take 1 tablet by mouth daily.  Dispense: 90 tablet; Refill: 1 -      Doxycycline Hyclate; Take 1 tablet (100 mg total) by mouth 2 (two) times daily.  Dispense: 14 tablet; Refill: 0     I provided 31 minutes of face-to-face time during this encounter reviewing patient's last visit with me,  previous surgical and non surgical procedures, previous  labs and imaging studies, counseling on currently addressed issues,  and post visit ordering to diagnostics and therapeutics .   Follow-up: Return in about 6 months (around 02/11/2023) for physical.   Sherlene Shams, MD

## 2022-08-12 NOTE — Assessment & Plan Note (Signed)
Diagnosed in August 2022 with A1c of 6.5  she has been well controlled on diet alone . Renal function stable, no changes today. 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   Lab Results  Component Value Date   HGBA1C 6.4 08/09/2022   Lab Results  Component Value Date   MICROALBUR 2.3 (H) 08/09/2022   MICROALBUR 3.2 (H) 11/02/2021

## 2022-08-12 NOTE — Assessment & Plan Note (Signed)
Well controlled on current regimen of telmisartan hct . Renal function stable, no changes today.  Lab Results  Component Value Date   CREATININE 0.98 08/09/2022   Lab Results  Component Value Date   NA 137 08/09/2022   K 4.1 08/09/2022   CL 101 08/09/2022   CO2 26 08/09/2022

## 2022-08-16 ENCOUNTER — Telehealth: Payer: Self-pay

## 2022-08-16 ENCOUNTER — Other Ambulatory Visit: Payer: Self-pay

## 2022-08-16 DIAGNOSIS — Z1211 Encounter for screening for malignant neoplasm of colon: Secondary | ICD-10-CM

## 2022-08-16 MED ORDER — NA SULFATE-K SULFATE-MG SULF 17.5-3.13-1.6 GM/177ML PO SOLN: 1.0000 | Freq: Once | ORAL | 0 refills | Status: AC

## 2022-08-16 NOTE — Telephone Encounter (Signed)
Gastroenterology Pre-Procedure Review  Request Date: 11/08/22 Requesting Physician: Dr. Tobi Bastos  PATIENT REVIEW QUESTIONS: The patient responded to the following health history questions as indicated:    1. Are you having any GI issues? no 2. Do you have a personal history of Polyps? no 3. Do you have a family history of Colon Cancer or Polyps? yes (2 maternal cousins died of colon cancer) 4. Diabetes Mellitus? no 5. Joint replacements in the past 12 months?no 6. Major health problems in the past 3 months?no 7. Any artificial heart valves, MVP, or defibrillator?no    MEDICATIONS & ALLERGIES:    Patient reports the following regarding taking any anticoagulation/antiplatelet therapy:   Plavix, Coumadin, Eliquis, Xarelto, Lovenox, Pradaxa, Brilinta, or Effient? no Aspirin? no  Patient confirms/reports the following medications:  Current Outpatient Medications  Medication Sig Dispense Refill   atorvastatin (LIPITOR) 20 MG tablet Take 1 tablet (20 mg total) by mouth daily. 90 tablet 1   doxycycline (VIBRA-TABS) 100 MG tablet Take 1 tablet (100 mg total) by mouth 2 (two) times daily. 14 tablet 0   loratadine (CLARITIN) 10 MG tablet Take 10 mg by mouth daily.     Multiple Vitamins-Minerals (MULTIVITAMIN WITH MINERALS) tablet Take 1 tablet by mouth daily.     telmisartan-hydrochlorothiazide (MICARDIS HCT) 40-12.5 MG tablet Take 1 tablet by mouth daily. 90 tablet 1   No current facility-administered medications for this visit.    Patient confirms/reports the following allergies:  Allergies  Allergen Reactions   Macrodantin Hives    No orders of the defined types were placed in this encounter.   AUTHORIZATION INFORMATION Primary Insurance: 1D#: Group #:  Secondary Insurance: 1D#: Group #:  SCHEDULE INFORMATION: Date: 11/08/22 Time: Location: [facility]

## 2022-09-27 ENCOUNTER — Encounter: Payer: Self-pay | Admitting: Internal Medicine

## 2022-10-29 ENCOUNTER — Encounter: Payer: Self-pay | Admitting: Internal Medicine

## 2022-11-05 ENCOUNTER — Encounter: Payer: Self-pay | Admitting: Gastroenterology

## 2022-11-08 ENCOUNTER — Encounter
Admission: RE | Disposition: A | Discharge status: Home / Self Care | Payer: Self-pay | Source: Home / Self Care | Attending: Gastroenterology

## 2022-11-08 ENCOUNTER — Ambulatory Visit
Admission: RE | Admit: 2022-11-08 | Discharge: 2022-11-08 | Disposition: A | Discharge status: Home / Self Care | Payer: BC Managed Care – PPO | Attending: Gastroenterology | Admitting: Gastroenterology

## 2022-11-08 ENCOUNTER — Ambulatory Visit: Payer: BC Managed Care – PPO | Admitting: General Practice

## 2022-11-08 DIAGNOSIS — E785 Hyperlipidemia, unspecified: Secondary | ICD-10-CM | POA: Diagnosis not present

## 2022-11-08 DIAGNOSIS — D126 Benign neoplasm of colon, unspecified: Secondary | ICD-10-CM | POA: Diagnosis not present

## 2022-11-08 DIAGNOSIS — Z79899 Other long term (current) drug therapy: Secondary | ICD-10-CM | POA: Insufficient documentation

## 2022-11-08 DIAGNOSIS — K635 Polyp of colon: Secondary | ICD-10-CM | POA: Diagnosis not present

## 2022-11-08 DIAGNOSIS — I10 Essential (primary) hypertension: Secondary | ICD-10-CM | POA: Diagnosis not present

## 2022-11-08 DIAGNOSIS — Z1211 Encounter for screening for malignant neoplasm of colon: Secondary | ICD-10-CM | POA: Insufficient documentation

## 2022-11-08 DIAGNOSIS — Z800 Family history of malignant neoplasm of digestive organs: Secondary | ICD-10-CM | POA: Insufficient documentation

## 2022-11-08 HISTORY — PX: COLONOSCOPY WITH PROPOFOL: SHX5780

## 2022-11-08 HISTORY — PX: POLYPECTOMY: SHX5525

## 2022-11-08 SURGERY — COLONOSCOPY WITH PROPOFOL
Anesthesia: General

## 2022-11-08 MED ORDER — PROPOFOL 500 MG/50ML IV EMUL
INTRAVENOUS | Status: DC | PRN
  Administered 2022-11-08: 150 ug/kg/min via INTRAVENOUS

## 2022-11-08 MED ORDER — SODIUM CHLORIDE 0.9 % IV SOLN: INTRAVENOUS | Status: DC

## 2022-11-08 MED ORDER — PROPOFOL 10 MG/ML IV BOLUS
INTRAVENOUS | Status: DC | PRN
  Administered 2022-11-08: 70 mg via INTRAVENOUS
  Administered 2022-11-08: 30 mg via INTRAVENOUS

## 2022-11-08 MED ORDER — DEXMEDETOMIDINE HCL IN NACL 80 MCG/20ML IV SOLN
INTRAVENOUS | Status: DC | PRN
  Administered 2022-11-08: 8 ug via INTRAVENOUS

## 2022-11-08 MED ORDER — LIDOCAINE HCL (CARDIAC) PF 100 MG/5ML IV SOSY
PREFILLED_SYRINGE | INTRAVENOUS | Status: DC | PRN
  Administered 2022-11-08: 50 mg via INTRAVENOUS

## 2022-11-08 NOTE — Anesthesia Procedure Notes (Signed)
Date/Time: 11/08/2022 9:26 AM  Performed by: Ginger Carne, CRNAPre-anesthesia Checklist: Patient identified, Emergency Drugs available, Suction available, Patient being monitored and Timeout performed Patient Re-evaluated:Patient Re-evaluated prior to induction Oxygen Delivery Method: Nasal cannula Preoxygenation: Pre-oxygenation with 100% oxygen Induction Type: IV induction

## 2022-11-08 NOTE — Anesthesia Preprocedure Evaluation (Signed)
Anesthesia Evaluation  Patient identified by MRN, date of birth, ID band Patient awake    Reviewed: Allergy & Precautions, NPO status , Patient's Chart, lab work & pertinent test results  History of Anesthesia Complications Negative for: history of anesthetic complications  Airway Mallampati: II  TM Distance: >3 FB Neck ROM: full    Dental  (+) Chipped, Dental Advidsory Given   Pulmonary neg pulmonary ROS, neg shortness of breath, neg COPD   Pulmonary exam normal        Cardiovascular hypertension, (-) angina negative cardio ROS Normal cardiovascular exam(-) dysrhythmias      Neuro/Psych negative neurological ROS  negative psych ROS   GI/Hepatic negative GI ROS, Neg liver ROS,,,  Endo/Other  negative endocrine ROS    Renal/GU negative Renal ROS  negative genitourinary   Musculoskeletal   Abdominal   Peds  Hematology negative hematology ROS (+)   Anesthesia Other Findings Past Medical History: No date: Allergy 10/20/2012: Diverticulitis large intestine w/o perforation or abscess  w/o bleeding No date: Family history of brain cancer No date: Family history of breast cancer in female No date: Family history of colon cancer No date: Hyperlipidemia No date: Hypertension  Past Surgical History: 2005: ABDOMINAL HYSTERECTOMY     Comment:  endometriosis and adenomyosis, Dr. Harold Hedge 2001: BREAST BIOPSY; Right     Comment:  CORE W/CLIP - NEG 2001: BREAST CYST ASPIRATION; Right     Comment:  NEG 2000: BREAST SURGERY     Comment:  cyst 2000; right; benign 1968: TONSILLECTOMY  BMI    Body Mass Index: 33.66 kg/m      Reproductive/Obstetrics negative OB ROS                             Anesthesia Physical Anesthesia Plan  ASA: 2  Anesthesia Plan: General   Post-op Pain Management: Minimal or no pain anticipated   Induction: Intravenous  PONV Risk Score and Plan: 3 and Propofol  infusion, TIVA and Ondansetron  Airway Management Planned: Nasal Cannula  Additional Equipment: None  Intra-op Plan:   Post-operative Plan:   Informed Consent: I have reviewed the patients History and Physical, chart, labs and discussed the procedure including the risks, benefits and alternatives for the proposed anesthesia with the patient or authorized representative who has indicated his/her understanding and acceptance.     Dental advisory given  Plan Discussed with: CRNA and Surgeon  Anesthesia Plan Comments: (Discussed risks of anesthesia with patient, including possibility of difficulty with spontaneous ventilation under anesthesia necessitating airway intervention, PONV, and rare risks such as cardiac or respiratory or neurological events, and allergic reactions. Discussed the role of CRNA in patient's perioperative care. Patient understands.)       Anesthesia Quick Evaluation

## 2022-11-08 NOTE — Anesthesia Postprocedure Evaluation (Signed)
Anesthesia Post Note  Patient: LENNYN GANGE  Procedure(s) Performed: COLONOSCOPY WITH PROPOFOL POLYPECTOMY  Patient location during evaluation: Endoscopy Anesthesia Type: General Level of consciousness: awake and alert Pain management: pain level controlled Vital Signs Assessment: post-procedure vital signs reviewed and stable Respiratory status: spontaneous breathing, nonlabored ventilation, respiratory function stable and patient connected to nasal cannula oxygen Cardiovascular status: blood pressure returned to baseline and stable Postop Assessment: no apparent nausea or vomiting Anesthetic complications: no  No notable events documented.   Last Vitals:  Vitals:   11/08/22 0956 11/08/22 1006  BP: 93/70 (!) 97/57  Pulse: 60 62  Resp: 16 16  Temp: (!) 35.8 C (!) 35.8 C  SpO2: 95% 97%    Last Pain:  Vitals:   11/08/22 1006  TempSrc: Temporal  PainSc: 0-No pain                 Stephanie Coup

## 2022-11-08 NOTE — H&P (Signed)
Nichole Mood, MD [address], [address], [place], [place], [address] [address], [place], [place], [address] Phone: [phone]  Fax: [phone]  Primary Care Physician:  Sherlene Shams, MD   Pre-Procedure History & Physical: HPI:  Nichole Jensen is a 62 y.o. female is here for an colonoscopy.   Past Medical History:  Diagnosis Date   Allergy    Diverticulitis large intestine w/o perforation or abscess w/o bleeding 10/20/2012   Family history of brain cancer    Family history of breast cancer in female    Family history of colon cancer    Hyperlipidemia    Hypertension     Past Surgical History:  Procedure Laterality Date   ABDOMINAL HYSTERECTOMY  2005   endometriosis and adenomyosis, Dr. Harold Hedge   BREAST BIOPSY Right 2001   CORE W/CLIP - NEG   BREAST CYST ASPIRATION Right 2001   NEG   BREAST SURGERY  2000   cyst 2000; right; benign   TONSILLECTOMY  1968    Prior to Admission medications   Medication Sig Start Date End Date Taking? Authorizing Provider  telmisartan-hydrochlorothiazide (MICARDIS HCT) 40-12.5 MG tablet Take 1 tablet by mouth daily. 08/12/22  Yes Sherlene Shams, MD  atorvastatin (LIPITOR) 20 MG tablet Take 1 tablet (20 mg total) by mouth daily. 08/12/22   Sherlene Shams, MD  doxycycline (VIBRA-TABS) 100 MG tablet Take 1 tablet (100 mg total) by mouth 2 (two) times daily. 08/12/22   Sherlene Shams, MD  loratadine (CLARITIN) 10 MG tablet Take 10 mg by mouth daily.    [provider]  Multiple Vitamins-Minerals (MULTIVITAMIN WITH MINERALS) tablet Take 1 tablet by mouth daily.    [provider]    Allergies as of 08/16/2022 - Review Complete 08/16/2022  Allergen Reaction Noted   Macrodantin Hives 03/16/2011    Family History  Problem Relation Age of Onset   Heart disease Father 60       massive MI   Diabetes Sister    Hypertension Sister    Diabetes Brother    Hypertension Brother    Cancer Brother 35        female breast cancer, spreading to lung, non-smoker   Heart disease Paternal Grandmother    Heart disease Paternal Grandfather    COPD Maternal Uncle        d. 75   Brain cancer Paternal Aunt    Polycythemia Paternal Aunt    Colon cancer Paternal Aunt    Polycythemia Paternal Aunt    Heart disease Paternal Aunt    Polycythemia Paternal Aunt    Cancer Paternal Uncle        thyroid cancer   Heart disease Paternal Uncle    Stomach cancer Neg Hx    Breast cancer Neg Hx     Social History   Socioeconomic History   Marital status: Divorced    Spouse name: Not on file   Number of children: Not on file   Years of education: Not on file   Highest education level: Master's degree (e.g., MA, MS, MEng, MEd, MSW, MBA)  Occupational History   Not on file  Tobacco Use   Smoking status: Never   Smokeless tobacco: Never  Vaping Use   Vaping status: Never Used  Substance and Sexual Activity   Alcohol use: Yes    Comment: rare   Drug use: No   Sexual activity: Not on file  Other Topics Concern  Not on file  Social History Narrative   Not on file   Social Determinants of Health   Financial Resource Strain: Low Risk  (08/08/2022)   Overall Financial Resource Strain (CARDIA)    Difficulty of Paying Living Expenses: Not hard at all  Food Insecurity: No Food Insecurity (08/08/2022)   Hunger Vital Sign    Worried About Running Out of Food in the Last Year: Never true    Ran Out of Food in the Last Year: Never true  Transportation Needs: No Transportation Needs (08/08/2022)   PRAPARE - Administrator, Civil Service (Medical): No    Lack of Transportation (Non-Medical): No  Physical Activity: Sufficiently Active (08/08/2022)   Exercise Vital Sign    Days of Exercise per Week: 5 days    Minutes of Exercise per Session: 60 min  Stress: No Stress Concern Present (08/08/2022)   [employer] of Occupational Health - Occupational Stress Questionnaire    Feeling of Stress : Not  at all  Social Connections: Moderately Integrated (08/08/2022)   Social Connection and Isolation Panel [NHANES]    Frequency of Communication with Friends and Family: More than three times a week    Frequency of Social Gatherings with Friends and Family: More than three times a week    Attends Religious Services: More than 4 times per year    Active Member of [employer] or Organizations: Yes    Attends Engineer, structural: More than 4 times per year    Marital Status: Divorced  Catering manager Violence: Not on file    Review of Systems: See HPI, otherwise negative ROS  Physical Exam: BP (!) 132/51   Pulse 76   Temp (!) 96.8 F (36 C) (Temporal)   Resp 16   Ht 5\' 3"  (1.6 m)   Wt 86.2 kg   SpO2 100%   BMI 33.66 kg/m  General:   Alert,  pleasant and cooperative in NAD Head:  Normocephalic and atraumatic. Neck:  Supple; no masses or thyromegaly. Lungs:  Clear throughout to auscultation, normal respiratory effort.    Heart:  +S1, +S2, Regular rate and rhythm, No edema. Abdomen:  Soft, nontender and nondistended. Normal bowel sounds, without guarding, and without rebound.   Neurologic:  Alert and  oriented x4;  grossly normal neurologically.  Impression/Plan: Nichole Jensen is here for an colonoscopy to be performed for Screening colonoscopy average risk   Risks, benefits, limitations, and alternatives regarding  colonoscopy have been reviewed with the patient.  Questions have been answered.  All parties agreeable.   Nichole Mood, MD  11/08/2022, 9:17 AM

## 2022-11-08 NOTE — Transfer of Care (Signed)
Immediate Anesthesia Transfer of Care Note  Patient: Nichole Jensen  Procedure(s) Performed: COLONOSCOPY WITH PROPOFOL POLYPECTOMY  Patient Location: Endoscopy Unit  Anesthesia Type:General  Level of Consciousness: sedated  Airway & Oxygen Therapy: Patient Spontanous Breathing  Post-op Assessment: Report given to RN and Post -op Vital signs reviewed and stable  Post vital signs: Reviewed and stable  Last Vitals:  Vitals Value Taken Time  BP 98/62 11/08/22 0947  Temp 35.8 C 11/08/22 0946  Pulse 66 11/08/22 0948  Resp 15 11/08/22 0948  SpO2 93 % 11/08/22 0948  Vitals shown include unfiled device data.  Last Pain:  Vitals:   11/08/22 0946  TempSrc: Temporal  PainSc: Asleep         Complications: No notable events documented.

## 2022-11-08 NOTE — Op Note (Signed)
[facility] Gastroenterology Patient Name: Nichole Jensen Procedure Date: 11/08/2022 9:06 AM MRN: [id number] Account #: [id number] Date of Birth: Jun 24, 1960 Admit Type: Outpatient Age: 62 Room: [facility] ENDO ROOM 1 Gender: Female Note Status: Finalized Instrument Name: Prentice Docker [id number] Procedure:             Colonoscopy Indications:           Screening for colorectal malignant neoplasm Providers:             Wyline Mood MD, MD Referring MD:          Duncan Dull, MD (Referring MD) Medicines:             Monitored Anesthesia Care Complications:         No immediate complications. Procedure:             Pre-Anesthesia Assessment:                        - Prior to the procedure, a History and Physical was                         performed, and patient medications, allergies and                         sensitivities were reviewed. The patient's tolerance                         of previous anesthesia was reviewed.                        - The risks and benefits of the procedure and the                         sedation options and risks were discussed with the                         patient. All questions were answered and informed                         consent was obtained.                        - ASA Grade Assessment: II - A patient with mild                         systemic disease.                        After obtaining informed consent, the colonoscope was                         passed under direct vision. Throughout the procedure,                         the patient's blood pressure, pulse, and oxygen                         saturations were monitored continuously. The                         Colonoscope was introduced  through the anus and                         advanced to the the cecum, identified by the                         appendiceal orifice. The colonoscopy was performed                         with ease. The patient tolerated the procedure well.                          The quality of the bowel preparation was excellent.                         The ileocecal valve, appendiceal orifice, and rectum                         were photographed. Findings:      The perianal and digital rectal examinations were normal.      A 5 mm polyp was found in the cecum. The polyp was sessile. The polyp       was removed with a cold snare. Resection and retrieval were complete.      The exam was otherwise without abnormality on direct and retroflexion       views. Impression:            - One 5 mm polyp in the cecum, removed with a cold                         snare. Resected and retrieved.                        - The examination was otherwise normal on direct and                         retroflexion views. Recommendation:        - Discharge patient to home (with escort).                        - Resume previous diet.                        - Continue present medications.                        - Await pathology results.                        - Repeat colonoscopy for surveillance based on                         pathology results. Procedure Code(s):     --- Professional ---                        [phone], Colonoscopy, flexible; with removal of                         tumor(s), polyp(s), or other lesion(s) by snare  technique Diagnosis Code(s):     --- Professional ---                        Z12.11, Encounter for screening for malignant neoplasm                         of colon                        D12.0, Benign neoplasm of cecum CPT copyright 2022 American [employer] Association. All rights reserved. The codes documented in this report are preliminary and upon coder review may  be revised to meet current compliance requirements. Wyline Mood, MD Wyline Mood MD, MD 11/08/2022 9:45:14 AM This report has been signed electronically. Number of Addenda: 0 Note Initiated On: 11/08/2022 9:06 AM Scope Withdrawal Time: 0 hours 7 minutes  48 seconds  Total Procedure Duration: 0 hours 12 minutes 55 seconds  Estimated Blood Loss:  Estimated blood loss: none.      [facility]

## 2022-11-09 ENCOUNTER — Encounter: Payer: Self-pay | Admitting: Gastroenterology

## 2023-01-11 ENCOUNTER — Other Ambulatory Visit: Payer: Self-pay | Admitting: Internal Medicine

## 2023-01-11 DIAGNOSIS — Z1231 Encounter for screening mammogram for malignant neoplasm of breast: Secondary | ICD-10-CM

## 2023-02-14 ENCOUNTER — Other Ambulatory Visit (INDEPENDENT_AMBULATORY_CARE_PROVIDER_SITE_OTHER): Payer: BC Managed Care – PPO

## 2023-02-14 ENCOUNTER — Ambulatory Visit: Payer: BC Managed Care – PPO | Admitting: Internal Medicine

## 2023-02-14 DIAGNOSIS — E1169 Type 2 diabetes mellitus with other specified complication: Secondary | ICD-10-CM | POA: Diagnosis not present

## 2023-02-14 DIAGNOSIS — E1121 Type 2 diabetes mellitus with diabetic nephropathy: Secondary | ICD-10-CM

## 2023-02-14 DIAGNOSIS — Z79899 Other long term (current) drug therapy: Secondary | ICD-10-CM

## 2023-02-14 DIAGNOSIS — E785 Hyperlipidemia, unspecified: Secondary | ICD-10-CM

## 2023-02-14 DIAGNOSIS — I10 Essential (primary) hypertension: Secondary | ICD-10-CM

## 2023-02-14 LAB — CBC WITH DIFFERENTIAL/PLATELET
Basophils Absolute: 0 10*3/uL (ref 0.0–0.1)
Basophils Relative: 0.5 % (ref 0.0–3.0)
Eosinophils Absolute: 0.1 10*3/uL (ref 0.0–0.7)
Eosinophils Relative: 1.1 % (ref 0.0–5.0)
HCT: 42.3 % (ref 36.0–46.0)
Hemoglobin: 13.7 g/dL (ref 12.0–15.0)
Lymphocytes Relative: 27.1 % (ref 12.0–46.0)
Lymphs Abs: 2 10*3/uL (ref 0.7–4.0)
MCHC: 32.3 g/dL (ref 30.0–36.0)
MCV: 86.2 fL (ref 78.0–100.0)
Monocytes Absolute: 0.5 10*3/uL (ref 0.1–1.0)
Monocytes Relative: 6.9 % (ref 3.0–12.0)
Neutro Abs: 4.7 10*3/uL (ref 1.4–7.7)
Neutrophils Relative %: 64.4 % (ref 43.0–77.0)
Platelets: 211 10*3/uL (ref 150.0–400.0)
RBC: 4.91 Mil/uL (ref 3.87–5.11)
RDW: 13.4 % (ref 11.5–15.5)
WBC: 7.3 10*3/uL (ref 4.0–10.5)

## 2023-02-14 LAB — LIPID PANEL
Cholesterol: 172 mg/dL (ref 0–200)
HDL: 45.6 mg/dL (ref >39.00)
LDL Cholesterol: 96 mg/dL (ref 0–99)
NonHDL: 126.53
Total CHOL/HDL Ratio: 4
Triglycerides: 153 mg/dL — ABNORMAL HIGH (ref 0.0–149.0)
VLDL: 30.6 mg/dL (ref 0.0–40.0)

## 2023-02-14 LAB — COMPREHENSIVE METABOLIC PANEL
ALT: 15 U/L (ref 0–35)
AST: 18 U/L (ref 0–37)
Albumin: 4.4 g/dL (ref 3.5–5.2)
Alkaline Phosphatase: 91 U/L (ref 39–117)
BUN: 15 mg/dL (ref 6–23)
CO2: 28 meq/L (ref 19–32)
Calcium: 9.8 mg/dL (ref 8.4–10.5)
Chloride: 102 meq/L (ref 96–112)
Creatinine, Ser: 0.94 mg/dL (ref 0.40–1.20)
GFR: 65.02 mL/min (ref >60.00)
Glucose, Bld: 118 mg/dL — ABNORMAL HIGH (ref 70–99)
Potassium: 4.3 meq/L (ref 3.5–5.1)
Sodium: 140 meq/L (ref 135–145)
Total Bilirubin: 0.4 mg/dL (ref 0.2–1.2)
Total Protein: 7 g/dL (ref 6.0–8.3)

## 2023-02-14 LAB — HEMOGLOBIN A1C: Hgb A1c MFr Bld: 6.3 % (ref 4.6–6.5)

## 2023-02-14 LAB — LDL CHOLESTEROL, DIRECT: Direct LDL: 103 mg/dL

## 2023-02-14 LAB — TSH: TSH: 3.38 u[IU]/mL (ref 0.35–5.50)

## 2023-02-17 ENCOUNTER — Encounter: Payer: Self-pay | Admitting: Internal Medicine

## 2023-02-17 ENCOUNTER — Ambulatory Visit: Payer: BC Managed Care – PPO | Admitting: Internal Medicine

## 2023-02-17 VITALS — BP 130/78 | HR 73 | Temp 97.6°F | Ht 63.0 in | Wt 193.4 lb

## 2023-02-17 DIAGNOSIS — Z0000 Encounter for general adult medical examination without abnormal findings: Secondary | ICD-10-CM | POA: Diagnosis not present

## 2023-02-17 DIAGNOSIS — Z800 Family history of malignant neoplasm of digestive organs: Secondary | ICD-10-CM | POA: Diagnosis not present

## 2023-02-17 DIAGNOSIS — I10 Essential (primary) hypertension: Secondary | ICD-10-CM | POA: Diagnosis not present

## 2023-02-17 DIAGNOSIS — E1121 Type 2 diabetes mellitus with diabetic nephropathy: Secondary | ICD-10-CM

## 2023-02-17 DIAGNOSIS — E1169 Type 2 diabetes mellitus with other specified complication: Secondary | ICD-10-CM | POA: Diagnosis not present

## 2023-02-17 DIAGNOSIS — E669 Obesity, unspecified: Secondary | ICD-10-CM

## 2023-02-17 DIAGNOSIS — E785 Hyperlipidemia, unspecified: Secondary | ICD-10-CM

## 2023-02-17 MED ORDER — TELMISARTAN-HCTZ 40-12.5 MG PO TABS: 1.0000 | ORAL_TABLET | Freq: Every day | ORAL | 1 refills | Status: DC

## 2023-02-17 MED ORDER — ATORVASTATIN CALCIUM 20 MG PO TABS: 20.0000 mg | ORAL_TABLET | Freq: Every day | ORAL | 1 refills | Status: DC

## 2023-02-17 NOTE — Assessment & Plan Note (Signed)
Well controlled on current regimen of telmisartan hct . Renal function stable, no changes today.  Lab Results  Component Value Date   CREATININE 0.94 02/14/2023   Lab Results  Component Value Date   NA 140 02/14/2023   K 4.3 02/14/2023   CL 102 02/14/2023   CO2 28 02/14/2023

## 2023-02-17 NOTE — Assessment & Plan Note (Addendum)
She has gained weight  since last visit.  .  Patient has an unclear  family history of thyroid cancer ,  Which may be a contraindication to using Ozempic  .  She will continue to work on diet  and obtain clarity on FH

## 2023-02-17 NOTE — Assessment & Plan Note (Signed)
Managed with atorvastatin .  The risks and benefits of taking 81 mg asa discussed and asa therapry advised  Lab Results  Component Value Date   CHOL 172 02/14/2023   HDL 45.60 02/14/2023   LDLCALC 96 02/14/2023   LDLDIRECT 103.0 02/14/2023   TRIG 153.0 (H) 02/14/2023   CHOLHDL 4 02/14/2023

## 2023-02-17 NOTE — Assessment & Plan Note (Signed)

## 2023-02-17 NOTE — Assessment & Plan Note (Addendum)
Diagnosed in August 2022 with A1c of 6.5  she has been well controlled on diet alone but  has BMI.30 . Renal function stable, no changes today. 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   Lab Results  Component Value Date   HGBA1C 6.3 02/14/2023   Lab Results  Component Value Date   MICROALBUR 2.3 (H) 08/09/2022   MICROALBUR 3.2 (H) 11/02/2021

## 2023-02-17 NOTE — Patient Instructions (Addendum)
Thank you for the information about [facility] in [facility]    If you find out the details on your uncle's thyroid CA , let me know  soMoun we can consider Ozempic or [facility]

## 2023-02-17 NOTE — Progress Notes (Signed)
Patient ID: Nichole Jensen, female    DOB: April 27, 1960  Age: 62 y.o. MRN: [id number]  The patient is here for annual preventive examination and management of other chronic and acute problems.   The risk factors are reflected in the social history.   The roster of all physicians providing medical care to patient - is listed in the Snapshot section of the chart.   Activities of daily living:  The patient is 100% independent in all ADLs: dressing, toileting, feeding as well as independent mobility   Home safety : The patient has smoke detectors in the home. They wear seatbelts.  There are no unsecured firearms at home. There is no violence in the home.    There is no risks for hepatitis, STDs or HIV. There is no   history of blood transfusion. They have no travel history to infectious disease endemic areas of the world.   The patient has seen their dentist in the last six month. They have seen their eye doctor in the last year. The patinet  denies slight hearing difficulty with regard to whispered voices and some television programs.  They have deferred audiologic testing in the last year.  They do not  have excessive sun exposure. Discussed the need for sun protection: hats, long sleeves and use of sunscreen if there is significant sun exposure.    Diet: the importance of a healthy diet is discussed. They do have a healthy diet.   The benefits of regular aerobic exercise were discussed. The patient  exercises  3 to 5 days per week  for  60 minutes.    Depression screen: there are no signs or vegative symptoms of depression- irritability, change in appetite, anhedonia, sadness/tearfullness.   The following portions of the patient's history were reviewed and updated as appropriate: allergies, current medications, past family history, past medical history,  past surgical history, past social history  and problem list.   Visual acuity was not assessed per patient preference since the patient has  regular follow up with an  ophthalmologist. Hearing and body mass index were assessed and reviewed.    During the course of the visit the patient was educated and counseled about appropriate screening and preventive services including : fall prevention , diabetes screening, nutrition counseling, colorectal cancer screening, and recommended immunizations.    Chief Complaint:  1) Diabetes /HTN/ Obesity:  weight gain of 5 lbs since April noted.  BMI 33  .  Has been caring for 42 yr old mother in [place] who has had a rough year since having a COVID infection .  Has 2 siblings who live closer so patient has finally come home.     Review of Symptoms  Patient denies headache, fevers, malaise, unintentional weight loss, skin rash, eye pain, sinus congestion and sinus pain, sore throat, dysphagia,  hemoptysis , cough, dyspnea, wheezing, chest pain, palpitations, orthopnea, edema, abdominal pain, nausea, melena, diarrhea, constipation, flank pain, dysuria, hematuria, urinary  Frequency, nocturia, numbness, tingling, seizures,  Focal weakness, Loss of consciousness,  Tremor, insomnia, depression, anxiety, and suicidal ideation.    Physical Exam:  BP 130/78   Pulse 73   Temp 97.6 F (36.4 C) (Oral)   Ht 5\' 3"  (1.6 m)   Wt 193 lb 6.4 oz (87.7 kg)   SpO2 97%   BMI 34.26 kg/m    Physical Exam  Assessment and Plan: Family history of colon cancer  Type II diabetes mellitus with nephropathy (HCC) Assessment & Plan: Diagnosed in  August 2022 with A1c of 6.5  she has been well controlled on diet alone but  has BMI.30 . Renal function stable, no changes today. 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   Lab Results  Component Value Date   HGBA1C 6.3 02/14/2023   Lab Results  Component Value Date   MICROALBUR 2.3 (H) 08/09/2022   MICROALBUR 3.2 (H) 11/02/2021        Essential hypertension Assessment &  Plan: Well controlled on current regimen of telmisartan hct . Renal function stable, no changes today.  Lab Results  Component Value Date   CREATININE 0.94 02/14/2023   Lab Results  Component Value Date   NA 140 02/14/2023   K 4.3 02/14/2023   CL 102 02/14/2023   CO2 28 02/14/2023      Hyperlipidemia associated with type 2 diabetes mellitus (HCC) Assessment & Plan:  Managed with atorvastatin .  The risks and benefits of taking 81 mg asa discussed and asa therapry advised  Lab Results  Component Value Date   CHOL 172 02/14/2023   HDL 45.60 02/14/2023   LDLCALC 96 02/14/2023   LDLDIRECT 103.0 02/14/2023   TRIG 153.0 (H) 02/14/2023   CHOLHDL 4 02/14/2023      Obesity (BMI 30-39.9) Assessment & Plan: She has gained weight  since last visit.  .  Patient has an unclear  family history of thyroid cancer ,  Which may be a contraindication to using Ozempic  .  She will continue to work on diet  and obtain clarity on FH   Encounter for preventive health examination Assessment & Plan: age appropriate education and counseling updated, referrals for preventative services and immunizations addressed, dietary and smoking counseling addressed, most recent labs reviewed.  I have personally reviewed and have noted:   1) the patient's medical and social history 2) The pt's use of alcohol, tobacco, and illicit drugs 3) The patient's current medications and supplements 4) Functional ability including ADL's, fall risk, home safety risk, hearing and visual impairment 5) Diet and physical activities 6) Evidence for depression or mood disorder 7) The patient's height, weight, and BMI have been recorded in the chart   I have made referrals, and provided counseling and education based on review of the above    Other orders -     Atorvastatin Calcium; Take 1 tablet (20 mg total) by mouth daily.  Dispense: 90 tablet; Refill: 1 -     Telmisartan-HCTZ; Take 1 tablet by mouth daily.  Dispense:  90 tablet; Refill: 1    No follow-ups on file.  Sherlene Shams, MD

## 2023-02-24 ENCOUNTER — Ambulatory Visit
Admission: RE | Admit: 2023-02-24 | Discharge: 2023-02-24 | Disposition: A | Discharge status: Home / Self Care | Payer: BC Managed Care – PPO | Source: Ambulatory Visit | Attending: Internal Medicine | Admitting: Internal Medicine

## 2023-02-24 DIAGNOSIS — Z1231 Encounter for screening mammogram for malignant neoplasm of breast: Secondary | ICD-10-CM | POA: Insufficient documentation

## 2023-06-20 ENCOUNTER — Encounter: Payer: Self-pay | Admitting: Internal Medicine

## 2023-06-20 LAB — HM DIABETES EYE EXAM

## 2023-07-28 ENCOUNTER — Telehealth: Payer: Self-pay | Admitting: Internal Medicine

## 2023-07-28 DIAGNOSIS — E1121 Type 2 diabetes mellitus with diabetic nephropathy: Secondary | ICD-10-CM

## 2023-07-28 NOTE — Telephone Encounter (Signed)
 Patient need lab orders.

## 2023-08-10 ENCOUNTER — Other Ambulatory Visit (INDEPENDENT_AMBULATORY_CARE_PROVIDER_SITE_OTHER): Payer: Self-pay

## 2023-08-10 DIAGNOSIS — E1121 Type 2 diabetes mellitus with diabetic nephropathy: Secondary | ICD-10-CM | POA: Diagnosis not present

## 2023-08-10 LAB — COMPREHENSIVE METABOLIC PANEL WITH GFR
ALT: 14 U/L (ref 0–35)
AST: 15 U/L (ref 0–37)
Albumin: 4.7 g/dL (ref 3.5–5.2)
Alkaline Phosphatase: 99 U/L (ref 39–117)
BUN: 21 mg/dL (ref 6–23)
CO2: 25 meq/L (ref 19–32)
Calcium: 9.9 mg/dL (ref 8.4–10.5)
Chloride: 101 meq/L (ref 96–112)
Creatinine, Ser: 0.9 mg/dL (ref 0.40–1.20)
GFR: 68.27 mL/min (ref >60.00)
Glucose, Bld: 106 mg/dL — ABNORMAL HIGH (ref 70–99)
Potassium: 4.1 meq/L (ref 3.5–5.1)
Sodium: 137 meq/L (ref 135–145)
Total Bilirubin: 0.4 mg/dL (ref 0.2–1.2)
Total Protein: 6.9 g/dL (ref 6.0–8.3)

## 2023-08-10 LAB — MICROALBUMIN / CREATININE URINE RATIO
Creatinine,U: 131 mg/dL
Microalb Creat Ratio: 13.3 mg/g (ref 0.0–30.0)
Microalb, Ur: 1.7 mg/dL (ref 0.0–1.9)

## 2023-08-10 LAB — HEMOGLOBIN A1C: Hgb A1c MFr Bld: 6.5 % (ref 4.6–6.5)

## 2023-08-11 ENCOUNTER — Encounter: Payer: Self-pay | Admitting: Internal Medicine

## 2023-08-11 LAB — LIPID PANEL W/REFLEX DIRECT LDL
Cholesterol: 213 mg/dL — ABNORMAL HIGH (ref <200)
HDL: 54 mg/dL (ref >=50)
LDL Cholesterol (Calc): 129 mg/dL — ABNORMAL HIGH
Non-HDL Cholesterol (Calc): 159 mg/dL — ABNORMAL HIGH (ref <130)
Total CHOL/HDL Ratio: 3.9 (calc) (ref <5.0)
Triglycerides: 188 mg/dL — ABNORMAL HIGH (ref <150)

## 2023-08-18 ENCOUNTER — Ambulatory Visit (INDEPENDENT_AMBULATORY_CARE_PROVIDER_SITE_OTHER): Payer: BC Managed Care – PPO | Admitting: Internal Medicine

## 2023-08-18 ENCOUNTER — Encounter: Payer: Self-pay | Admitting: Internal Medicine

## 2023-08-18 VITALS — BP 116/78 | HR 79 | Ht 63.0 in | Wt 189.6 lb

## 2023-08-18 DIAGNOSIS — E1169 Type 2 diabetes mellitus with other specified complication: Secondary | ICD-10-CM | POA: Diagnosis not present

## 2023-08-18 DIAGNOSIS — E1121 Type 2 diabetes mellitus with diabetic nephropathy: Secondary | ICD-10-CM

## 2023-08-18 DIAGNOSIS — F411 Generalized anxiety disorder: Secondary | ICD-10-CM | POA: Diagnosis not present

## 2023-08-18 DIAGNOSIS — R1030 Lower abdominal pain, unspecified: Secondary | ICD-10-CM

## 2023-08-18 DIAGNOSIS — E785 Hyperlipidemia, unspecified: Secondary | ICD-10-CM

## 2023-08-18 HISTORY — DX: Generalized anxiety disorder: F41.1

## 2023-08-18 MED ORDER — ALPRAZOLAM 0.25 MG PO TABS: 0.2500 mg | ORAL_TABLET | Freq: Every evening | ORAL | Status: AC | PRN

## 2023-08-18 MED ORDER — TELMISARTAN-HCTZ 40-12.5 MG PO TABS: 1.0000 | ORAL_TABLET | Freq: Every day | ORAL | 1 refills | Status: DC

## 2023-08-18 NOTE — Patient Instructions (Addendum)
 I am  prescribing a very low dose of  alprazolam  to help you sleep ON THE DAYS YOU ARE DEALING WITH STRESSFUL FAMILY SITUATIONS  .  Do not take if you are sleepy when you lie down,  but ok to take if you wake up BEFORE 4 AM and can't go back to sleep   DO NOT EVER TAKE WITH ALCOHOL (WITHIN 2 HOURS OR LESS)    CT ABD AND PELVIS ORDERED TO RULE OUT DIVERTICULOSIS

## 2023-08-18 NOTE — Assessment & Plan Note (Signed)
 Diagnosed in August 2022 with A1c of 6.5  she has been well controlled on diet alone but  has BMI.30 . Renal function stable, no changes today. 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   Lab Results  Component Value Date   HGBA1C 6.5 08/10/2023   Lab Results  Component Value Date   MICROALBUR 1.7 08/10/2023   MICROALBUR 2.3 (H) 08/09/2022     Diagnosed in August 2022 with A1c of 6.5  she has been well controlled on diet alone but  has BMI.30 . Renal function stable, no changes today. 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   Lab Results  Component Value Date   HGBA1C 6.5 08/10/2023   Lab Results  Component Value Date   MICROALBUR 1.7 08/10/2023   MICROALBUR 2.3 (H) 08/09/2022

## 2023-08-18 NOTE — Assessment & Plan Note (Signed)
 Previously managed with atorvastatin  which she suspended  a month ago due to sudden onset of abdominal pain .    Lab Results  Component Value Date   CHOL 213 (H) 08/10/2023   HDL 54 08/10/2023   LDLCALC 129 (H) 08/10/2023   LDLDIRECT 103.0 02/14/2023   TRIG 188 (H) 08/10/2023   CHOLHDL 3.9 08/10/2023

## 2023-08-18 NOTE — Progress Notes (Signed)
 Subjective:  Patient ID: Nichole Jensen, female    DOB: 1960-08-08  Age: 63 y.o. MRN: [id number]  CC: The primary encounter diagnosis was Lower abdominal pain of unknown etiology. Diagnoses of Intermittent lower abdominal pain, Type II diabetes mellitus with nephropathy (HCC), Hyperlipidemia associated with type 2 diabetes mellitus (HCC), and Anxiety state were also pertinent to this visit.   HPI DEVLYNN KNOFF presents for  Chief Complaint  Patient presents with   Medical Management of Chronic Issues    6 month follow up    1) obesity/diabetes/hypertension:  she has achieved weight loss of 4 lbs since October  and BP/diabetes are controlled.  LDL is still well above goal of 70  because she has stopped the atorvastatin    2) new onset constipation one month ago,  started using prune juice,  then developed LLQ discomfort,  and pervasive abd pain regardless of prandial state.  Simplified diet with an elimination diet and noted that the disturbances  were brought on by certain foods,  medications , and contact with certain family members. . Has not had any discomfort in 4-5 days   Outpatient Medications Prior to Visit  Medication Sig Dispense Refill   loratadine (CLARITIN) 10 MG tablet Take 10 mg by mouth daily.     Multiple Vitamins-Minerals (MULTIVITAMIN WITH MINERALS) tablet Take 1 tablet by mouth daily.     telmisartan -hydrochlorothiazide  (MICARDIS  HCT) 40-12.5 MG tablet Take 1 tablet by mouth daily. 90 tablet 1   doxycycline  (VIBRA -TABS) 100 MG tablet Take 1 tablet (100 mg total) by mouth 2 (two) times daily. (Patient not taking: Reported on 08/18/2023) 14 tablet 0   atorvastatin  (LIPITOR) 20 MG tablet Take 1 tablet (20 mg total) by mouth daily. (Patient not taking: Reported on 08/18/2023) 90 tablet 1   No facility-administered medications prior to visit.    Review of Systems;  Patient denies headache, fevers, malaise, unintentional weight loss, skin rash, eye pain, sinus  congestion and sinus pain, sore throat, dysphagia,  hemoptysis , cough, dyspnea, wheezing, chest pain, palpitations, orthopnea, edema, abdominal pain, nausea, melena, diarrhea, constipation, flank pain, dysuria, hematuria, urinary  Frequency, nocturia, numbness, tingling, seizures,  Focal weakness, Loss of consciousness,  Tremor, insomnia, depression, anxiety, and suicidal ideation.      Objective:  BP 116/78   Pulse 79   Ht 5\' 3"  (1.6 m)   Wt 189 lb 9.6 oz (86 kg)   SpO2 98%   BMI 33.59 kg/m   BP Readings from Last 3 Encounters:  08/18/23 116/78  02/17/23 130/78  11/08/22 (!) 97/57    Wt Readings from Last 3 Encounters:  08/18/23 189 lb 9.6 oz (86 kg)  02/17/23 193 lb 6.4 oz (87.7 kg)  11/08/22 190 lb (86.2 kg)    Physical Exam Vitals reviewed.  Constitutional:      General: She is not in acute distress.    Appearance: Normal appearance. She is normal weight. She is not ill-appearing, toxic-appearing or diaphoretic.  HENT:     Head: Normocephalic.  Eyes:     General: No scleral icterus.       Right eye: No discharge.        Left eye: No discharge.     Conjunctiva/sclera: Conjunctivae normal.  Cardiovascular:     Rate and Rhythm: Normal rate and regular rhythm.     Heart sounds: Normal heart sounds.  Pulmonary:     Effort: Pulmonary effort is normal. No respiratory distress.     Breath sounds: Normal  breath sounds.  Musculoskeletal:        General: Normal range of motion.  Skin:    General: Skin is warm and dry.  Neurological:     General: No focal deficit present.     Mental Status: She is alert and oriented to person, place, and time. Mental status is at baseline.  Psychiatric:        Mood and Affect: Mood normal.        Behavior: Behavior normal.        Thought Content: Thought content normal.        Judgment: Judgment normal.    Lab Results  Component Value Date   HGBA1C 6.5 08/10/2023   HGBA1C 6.3 02/14/2023   HGBA1C 6.4 08/09/2022    Lab Results   Component Value Date   CREATININE 0.90 08/10/2023   CREATININE 0.94 02/14/2023   CREATININE 0.98 08/09/2022    Lab Results  Component Value Date   WBC 7.3 02/14/2023   HGB 13.7 02/14/2023   HCT 42.3 02/14/2023   PLT 211.0 02/14/2023   GLUCOSE 106 (H) 08/10/2023   CHOL 213 (H) 08/10/2023   TRIG 188 (H) 08/10/2023   HDL 54 08/10/2023   LDLDIRECT 103.0 02/14/2023   LDLCALC 129 (H) 08/10/2023   ALT 14 08/10/2023   AST 15 08/10/2023   NA 137 08/10/2023   K 4.1 08/10/2023   CL 101 08/10/2023   CREATININE 0.90 08/10/2023   BUN 21 08/10/2023   CO2 25 08/10/2023   TSH 3.38 02/14/2023   HGBA1C 6.5 08/10/2023   MICROALBUR 1.7 08/10/2023    MM 3D SCREENING MAMMOGRAM BILATERAL BREAST Result Date: 02/28/2023 CLINICAL DATA:  Screening. EXAM: DIGITAL SCREENING BILATERAL MAMMOGRAM WITH TOMOSYNTHESIS AND CAD TECHNIQUE: Bilateral screening digital craniocaudal and mediolateral oblique mammograms were obtained. Bilateral screening digital breast tomosynthesis was performed. The images were evaluated with computer-aided detection. COMPARISON:  Previous exam(s). ACR Breast Density Category b: There are scattered areas of fibroglandular density. FINDINGS: There are no findings suspicious for malignancy. IMPRESSION: No mammographic evidence of malignancy. A result letter of this screening mammogram will be mailed directly to the patient. RECOMMENDATION: Screening mammogram in one year. (Code:SM-B-01Y) BI-RADS CATEGORY  1: Negative. Electronically Signed   By: Roda Cirri M.D.   On: 02/28/2023 12:23    Assessment & Plan:  .Lower abdominal pain of unknown etiology -     CT ABDOMEN PELVIS W CONTRAST; Future  Intermittent lower abdominal pain Assessment & Plan: Accompanied by nausea and anorexia for the past month with uninentional weight loss CT ordered    Type II diabetes mellitus with nephropathy [facility]) Assessment & Plan: Diagnosed in August 2022 with A1c of 6.5  she has been well controlled  on diet alone but  has BMI.30 . Renal function stable, no changes today. 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   Lab Results  Component Value Date   HGBA1C 6.5 08/10/2023   Lab Results  Component Value Date   MICROALBUR 1.7 08/10/2023   MICROALBUR 2.3 (H) 08/09/2022     Diagnosed in August 2022 with A1c of 6.5  she has been well controlled on diet alone but  has BMI.30 . Renal function stable, no changes today. 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   Lab Results  Component Value Date   HGBA1C 6.5 08/10/2023   Lab Results  Component Value Date   MICROALBUR 1.7 08/10/2023   MICROALBUR 2.3 (H) 08/09/2022        Hyperlipidemia associated with type 2 diabetes mellitus (HCC) Assessment & Plan:  Previously managed with atorvastatin  which she suspended  a month ago due to sudden onset of abdominal pain .    Lab Results  Component Value Date   CHOL 213 (H) 08/10/2023   HDL 54 08/10/2023   LDLCALC 129 (H) 08/10/2023   LDLDIRECT 103.0 02/14/2023   TRIG 188 (H) 08/10/2023   CHOLHDL 3.9 08/10/2023      Anxiety state Assessment & Plan: Situational, brought on by having to deal with several dificult family members on a weekly basis . Short supply of alprazolam  given. The risks and benefits of benzodiazepine use were discussed with patient today including excessive sedation leading to respiratory depression,  impaired thinking/driving, and addiction.  Patient was advised to avoid concurrent use with alcohol, to use medication only as needed and not to share with others  .     Other orders -     Telmisartan -HCTZ; Take 1 tablet by mouth daily.  Dispense: 90 tablet; Refill: 1 -     ALPRAZolam ; Take 1 tablet (0.25 mg total) by mouth at bedtime as needed for anxiety.  Dispense: 20 tablet; Refill:  02     I spent 34 minutes on the day of this face to face encounter reviewing patient's  , recent  labs and imaging studies, counseling on weight management,  reviewing the assessment and plan with patient, and post visit ordering and reviewing of  diagnostics and therapeutics with patient  .   Follow-up: Return in about 3 months (around 11/17/2023) for follow up diabetes.   Thersia Flax, MD

## 2023-08-18 NOTE — Assessment & Plan Note (Signed)
 Situational, brought on by having to deal with several dificult family members on a weekly basis . Short supply of alprazolam  given. The risks and benefits of benzodiazepine use were discussed with patient today including excessive sedation leading to respiratory depression,  impaired thinking/driving, and addiction.  Patient was advised to avoid concurrent use with alcohol, to use medication only as needed and not to share with others  .

## 2023-08-18 NOTE — Assessment & Plan Note (Signed)
 Accompanied by nausea and anorexia for the past month with uninentional weight loss CT ordered

## 2023-08-25 ENCOUNTER — Encounter: Payer: Self-pay | Admitting: Internal Medicine

## 2023-08-26 NOTE — Telephone Encounter (Signed)
 noted

## 2023-09-01 ENCOUNTER — Ambulatory Visit
Admission: RE | Admit: 2023-09-01 | Discharge: 2023-09-01 | Disposition: A | Discharge status: Home / Self Care | Source: Ambulatory Visit | Attending: Internal Medicine | Admitting: Internal Medicine

## 2023-09-01 DIAGNOSIS — R1030 Lower abdominal pain, unspecified: Secondary | ICD-10-CM | POA: Insufficient documentation

## 2023-09-01 MED ORDER — IOHEXOL 300 MG/ML  SOLN
100.0000 mL | Freq: Once | INTRAMUSCULAR | Status: AC | PRN
  Administered 2023-09-01: 100 mL via INTRAVENOUS

## 2023-09-02 ENCOUNTER — Encounter: Payer: Self-pay | Admitting: Internal Medicine

## 2023-09-02 NOTE — Telephone Encounter (Signed)
 Noted.

## 2023-10-20 ENCOUNTER — Encounter: Payer: Self-pay | Admitting: Internal Medicine

## 2023-10-20 DIAGNOSIS — A938 Other specified arthropod-borne viral fevers: Secondary | ICD-10-CM

## 2023-11-07 ENCOUNTER — Other Ambulatory Visit (INDEPENDENT_AMBULATORY_CARE_PROVIDER_SITE_OTHER)

## 2023-11-07 DIAGNOSIS — A938 Other specified arthropod-borne viral fevers: Secondary | ICD-10-CM

## 2023-11-12 LAB — ROCKY MTN SPOTTED FVR ABS PNL(IGG+IGM)
RMSF IgG: NOT DETECTED
RMSF IgM: NOT DETECTED

## 2023-11-12 LAB — EHRLICHIA ANTIBODY PANEL
E. CHAFFEENSIS AB IGG: <1:64 {titer}
E. CHAFFEENSIS AB IGM: <1:20 {titer}

## 2023-11-13 ENCOUNTER — Ambulatory Visit: Payer: Self-pay | Admitting: Internal Medicine

## 2023-11-17 ENCOUNTER — Ambulatory Visit: Admitting: Internal Medicine

## 2023-11-17 ENCOUNTER — Encounter: Payer: Self-pay | Admitting: Internal Medicine

## 2023-11-17 VITALS — BP 110/62 | HR 86 | Temp 98.0°F | Ht 63.0 in | Wt 194.0 lb

## 2023-11-17 DIAGNOSIS — W57XXXS Bitten or stung by nonvenomous insect and other nonvenomous arthropods, sequela: Secondary | ICD-10-CM

## 2023-11-17 DIAGNOSIS — R1030 Lower abdominal pain, unspecified: Secondary | ICD-10-CM | POA: Diagnosis not present

## 2023-11-17 DIAGNOSIS — F411 Generalized anxiety disorder: Secondary | ICD-10-CM

## 2023-11-17 DIAGNOSIS — E785 Hyperlipidemia, unspecified: Secondary | ICD-10-CM

## 2023-11-17 DIAGNOSIS — E1169 Type 2 diabetes mellitus with other specified complication: Secondary | ICD-10-CM | POA: Diagnosis not present

## 2023-11-17 DIAGNOSIS — S30860S Insect bite (nonvenomous) of lower back and pelvis, sequela: Secondary | ICD-10-CM

## 2023-11-17 DIAGNOSIS — E1121 Type 2 diabetes mellitus with diabetic nephropathy: Secondary | ICD-10-CM | POA: Diagnosis not present

## 2023-11-17 DIAGNOSIS — Z23 Encounter for immunization: Secondary | ICD-10-CM

## 2023-11-17 LAB — HEMOGLOBIN A1C: Hgb A1c MFr Bld: 6.5 % (ref 4.6–6.5)

## 2023-11-17 LAB — COMPREHENSIVE METABOLIC PANEL WITH GFR
ALT: 21 U/L (ref 0–35)
AST: 19 U/L (ref 0–37)
Albumin: 4.5 g/dL (ref 3.5–5.2)
Alkaline Phosphatase: 92 U/L (ref 39–117)
BUN: 19 mg/dL (ref 6–23)
CO2: 28 meq/L (ref 19–32)
Calcium: 9.6 mg/dL (ref 8.4–10.5)
Chloride: 102 meq/L (ref 96–112)
Creatinine, Ser: 0.89 mg/dL (ref 0.40–1.20)
GFR: 69.06 mL/min (ref >60.00)
Glucose, Bld: 104 mg/dL — ABNORMAL HIGH (ref 70–99)
Potassium: 4 meq/L (ref 3.5–5.1)
Sodium: 138 meq/L (ref 135–145)
Total Bilirubin: 0.4 mg/dL (ref 0.2–1.2)
Total Protein: 6.9 g/dL (ref 6.0–8.3)

## 2023-11-17 LAB — LIPID PANEL W/REFLEX DIRECT LDL
Cholesterol: 186 mg/dL (ref <200)
HDL: 54 mg/dL (ref >=50)
LDL Cholesterol (Calc): 100 mg/dL — ABNORMAL HIGH
Non-HDL Cholesterol (Calc): 132 mg/dL — ABNORMAL HIGH (ref <130)
Total CHOL/HDL Ratio: 3.4 (calc) (ref <5.0)
Triglycerides: 207 mg/dL — ABNORMAL HIGH (ref <150)

## 2023-11-17 MED ORDER — DOXYCYCLINE HYCLATE 100 MG PO TABS: 100.0000 mg | ORAL_TABLET | Freq: Two times a day (BID) | ORAL | 0 refills | Status: AC

## 2023-11-17 NOTE — Assessment & Plan Note (Signed)
 Situational, brought on by having to deal with several dificult family members on a weekly basis . Short supply of alprazolam  given but not used.  She has noted imporvement in symptoms since she confronted the family members/ . The risks and benefits of benzodiazepine use were discussed with patient today including excessive sedation leading to respiratory depression,  impaired thinking/driving, and addiction.  Patient was advised to avoid concurrent use with alcohol, to use medication only as needed and not to share with others  .

## 2023-11-17 NOTE — Progress Notes (Addendum)
 Subjective:  Patient ID: Nichole Jensen, female    DOB: Feb 20, 1961  Age: 63 y.o. MRN: [id number]  CC: The primary encounter diagnosis was Hyperlipidemia associated with type 2 diabetes mellitus (HCC). Diagnoses of Type II diabetes mellitus with nephropathy (HCC), Tick bite of lower back, sequela, Intermittent lower abdominal pain, Anxiety state, and Need for pneumococcal 20-valent conjugate vaccination were also pertinent to this visit.   HPI EARL ZELLMER presents for  Chief Complaint  Patient presents with   Medical Management of Chronic Issues    3 month  F/U    TICK BITE OCCURRED IN [place] . ON FAMILY FARM. MAY 31 .  HAD A RASH,  NOT A BULLS EYE  AROUND THE BITE. SWOLLEN LYMPH NODES IN NECK ,  HEADACHE,  AND WAVES OF FATIGUE  THAT FINALLY RESOLVED LAST WEEK,  COMPLETED A COURSE OF DOXYCYCLINE   LOWER ABDOMINAL  SYMPTOMS IMPROVED WITH MODIFIED/ ELIMINATION DIET   INSOMNIA IMPROVED WITH IMPROVEMENT IN FAMILY CONFLICTS . Has not had to use the alprazolam .   OBESITY/DIABETES:  has not been exercising much   DUE TO HEAT .  HAS NOT HAD STATIN SINCE THE SPRING DUE TO UNDIAGNOSED STOMACH ISSUES .  Stomach issues resolved with elimination diet.    USING SUNSCREEN FROM [email or web address]    Outpatient Medications Prior to Visit  Medication Sig Dispense Refill   ALPRAZolam  (XANAX ) 0.25 MG tablet Take 1 tablet (0.25 mg total) by mouth at bedtime as needed for anxiety. 20 tablet 02   loratadine (CLARITIN) 10 MG tablet Take 10 mg by mouth daily.     Multiple Vitamins-Minerals (MULTIVITAMIN WITH MINERALS) tablet Take 1 tablet by mouth daily.     telmisartan -hydrochlorothiazide  (MICARDIS  HCT) 40-12.5 MG tablet Take 1 tablet by mouth daily. 90 tablet 1   doxycycline  (VIBRA -TABS) 100 MG tablet Take 1 tablet (100 mg total) by mouth 2 (two) times daily. 14 tablet 0   No facility-administered medications prior to visit.    Review of Systems;  Patient denies headache, fevers, malaise,  unintentional weight loss, skin rash, eye pain, sinus congestion and sinus pain, sore throat, dysphagia,  hemoptysis , cough, dyspnea, wheezing, chest pain, palpitations, orthopnea, edema, abdominal pain, nausea, melena, diarrhea, constipation, flank pain, dysuria, hematuria, urinary  Frequency, nocturia, numbness, tingling, seizures,  Focal weakness, Loss of consciousness,  Tremor, insomnia, depression, anxiety, and suicidal ideation.      Objective:  BP 110/62 (BP Location: Left Arm, Patient Position: Sitting)   Pulse 86   Temp 98 F (36.7 C) (Oral)   Ht 5' 3 (1.6 m)   Wt 194 lb (88 kg)   SpO2 97%   BMI 34.37 kg/m   BP Readings from Last 3 Encounters:  11/17/23 110/62  08/18/23 116/78  02/17/23 130/78    Wt Readings from Last 3 Encounters:  11/17/23 194 lb (88 kg)  08/18/23 189 lb 9.6 oz (86 kg)  02/17/23 193 lb 6.4 oz (87.7 kg)    Physical Exam Vitals reviewed.  Constitutional:      General: She is not in acute distress.    Appearance: Normal appearance. She is obese. She is not ill-appearing, toxic-appearing or diaphoretic.  HENT:     Head: Normocephalic.  Eyes:     General: No scleral icterus.       Right eye: No discharge.        Left eye: No discharge.     Conjunctiva/sclera: Conjunctivae normal.  Cardiovascular:     Rate and Rhythm: Normal rate and regular  rhythm.     Heart sounds: Normal heart sounds.  Pulmonary:     Effort: Pulmonary effort is normal. No respiratory distress.     Breath sounds: Normal breath sounds.  Musculoskeletal:        General: Normal range of motion.  Skin:    General: Skin is warm and dry.  Neurological:     General: No focal deficit present.     Mental Status: She is alert and oriented to person, place, and time. Mental status is at baseline.  Psychiatric:        Mood and Affect: Mood normal.        Behavior: Behavior normal.        Thought Content: Thought content normal.        Judgment: Judgment normal.     Lab Results   Component Value Date   HGBA1C 6.5 11/17/2023   HGBA1C 6.5 08/10/2023   HGBA1C 6.3 02/14/2023    Lab Results  Component Value Date   CREATININE 0.89 11/17/2023   CREATININE 0.90 08/10/2023   CREATININE 0.94 02/14/2023    Lab Results  Component Value Date   WBC 7.3 02/14/2023   HGB 13.7 02/14/2023   HCT 42.3 02/14/2023   PLT 211.0 02/14/2023   GLUCOSE 104 (H) 11/17/2023   CHOL 186 11/17/2023   TRIG 207 (H) 11/17/2023   HDL 54 11/17/2023   LDLDIRECT 103.0 02/14/2023   LDLCALC 100 (H) 11/17/2023   ALT 21 11/17/2023   AST 19 11/17/2023   NA 138 11/17/2023   K 4.0 11/17/2023   CL 102 11/17/2023   CREATININE 0.89 11/17/2023   BUN 19 11/17/2023   CO2 28 11/17/2023   TSH 3.38 02/14/2023   HGBA1C 6.5 11/17/2023   MICROALBUR 1.7 08/10/2023    CT ABDOMEN PELVIS W CONTRAST Result Date: 09/01/2023 CLINICAL DATA:  one month history of lower abdominal pain, unrelated to prandial state, new onset constipation EXAM: CT ABDOMEN AND PELVIS WITH CONTRAST TECHNIQUE: Multidetector CT imaging of the abdomen and pelvis was performed using the standard protocol following bolus administration of intravenous contrast. RADIATION DOSE REDUCTION: This exam was performed according to the departmental dose-optimization program which includes automated exposure control, adjustment of the mA and/or kV according to patient size and/or use of iterative reconstruction technique. CONTRAST:  OMNIPAQUE  IOHEXOL  300 MG/ML  SOLN COMPARISON:  CT abdomen and pelvis 11/22/2017. FINDINGS: Lower chest: No acute abnormality. Hepatobiliary: No focal liver abnormality is seen. No gallstones, gallbladder wall thickening, or biliary dilatation. Pancreas: Unremarkable. No pancreatic ductal dilatation or surrounding inflammatory changes. Spleen: Normal in size without focal abnormality. Adrenals/Urinary Tract: Adrenal glands are unremarkable. Kidneys are normal, without renal calculi, focal lesion, or hydronephrosis. Bladder  is unremarkable. Stomach/Bowel: Stomach is within normal limits. Appendix appears normal. Sigmoid diverticulosis. No evidence of bowel wall thickening, distention, or inflammatory changes. Vascular/Lymphatic: No significant vascular findings are present. No enlarged abdominal or pelvic lymph nodes. Reproductive: Status post hysterectomy. No adnexal masses. Other: No abdominal wall hernia or abnormality. No abdominopelvic ascites. Musculoskeletal: No acute or significant osseous findings. IMPRESSION: No acute abnormality in the abdomen or pelvis. Electronically Signed   By: Gwynneth Akin M.D.   On: 09/01/2023 12:20    Assessment & Plan:  .Hyperlipidemia associated with type 2 diabetes mellitus (HCC) Assessment & Plan:  Previously managed with atorvastatin  which she suspended  a month ago due to sudden onset of abdominal pain .  Advised to resume   Lab Results  Component Value Date  CHOL 186 11/17/2023   HDL 54 11/17/2023   LDLCALC 100 (H) 11/17/2023   LDLDIRECT 103.0 02/14/2023   TRIG 207 (H) 11/17/2023   CHOLHDL 3.4 11/17/2023     Orders: -     Lipid panel; Future -     LDL cholesterol, direct; Future -     Lipid Panel w/reflex Direct LDL  Type II diabetes mellitus with nephropathy [facility]) Assessment & Plan: Diagnosed in August 2022 with A1c of 6.5  she has been well controlled on diet alone but  has BMI >30 . Renal function stable, no changes today. She suspended statin due to abd pain which was determined to be diet related.  Recommend resuming  statin therapy if LDL is > 70 and continuing ARB for management of microalbuminuria which is decreasing by current surveillance labs.   Lab Results  Component Value Date   HGBA1C 6.5 08/10/2023   Lab Results  Component Value Date   MICROALBUR 1.7 08/10/2023   MICROALBUR 44.0 (H) 10/07/2017         Orders: -     Comprehensive metabolic panel with GFR; Future -     Hemoglobin A1c; Future -     Hemoglobin A1c -      Comprehensive metabolic panel with GFR  Tick bite of lower back, sequela Assessment & Plan: She is requesting serologies for Lymes disease given her protracted symptoms for a tick bite that occurred at her family [place] in [place].   Orders: -     Lyme Disease Serology w/Reflex  Intermittent lower abdominal pain Assessment & Plan: Resolved with dietary changes.  CT was negative for diverticulitis    Anxiety state Assessment & Plan: Situational, brought on by having to deal with several dificult family members on a weekly basis . Short supply of alprazolam  given but not used.  She has noted imporvement in symptoms since she confronted the family members/ . The risks and benefits of benzodiazepine use were discussed with patient today including excessive sedation leading to respiratory depression,  impaired thinking/driving, and addiction.  Patient was advised to avoid concurrent use with alcohol, to use medication only as needed and not to share with others  .     Need for pneumococcal 20-valent conjugate vaccination -     Pneumococcal conjugate vaccine 20-valent  Other orders -     Doxycycline  Hyclate; Take 1 tablet (100 mg total) by mouth 2 (two) times daily.  Dispense: 14 tablet; Refill: 0     I spent 30 minutes on the day of this face to face encounter reviewing patient's  recent CT scan.  prior relevant surgical and non surgical procedures,  previous  labs and imaging studies, counseling on weight management,  reviewing the assessment and plan with patient, and post visit ordering and reviewing of  diagnostics and therapeutics with patient  .   Follow-up: Return in about 6 months (around 05/19/2024) for physical.   Verneita LITTIE Kettering, MD

## 2023-11-17 NOTE — Patient Instructions (Signed)
 I HAVE refilled  doxycycline  to keep on hand for insect bites that begin to look infected  Lyme serologies and lipid panel today along with A1c  Return in 6 months

## 2023-11-17 NOTE — Assessment & Plan Note (Addendum)
 Diagnosed in August 2022 with A1c of 6.5  she has been well controlled on diet alone but  has BMI >30 . Renal function stable, no changes today. She suspended statin due to abd pain which was determined to be diet related.  Recommend resuming  statin therapy if LDL is > 70 and continuing ARB for management of microalbuminuria which is decreasing by current surveillance labs.   Lab Results  Component Value Date   HGBA1C 6.5 08/10/2023   Lab Results  Component Value Date   MICROALBUR 1.7 08/10/2023   MICROALBUR 44.0 (H) 10/07/2017

## 2023-11-17 NOTE — Assessment & Plan Note (Signed)
 Resolved with dietary changes.  CT was negative for diverticulitis

## 2023-11-17 NOTE — Assessment & Plan Note (Signed)
 She is requesting serologies for Lymes disease given her protracted symptoms for a tick bite that occurred at her family [place] in [place].

## 2023-11-18 LAB — LYME DISEASE SEROLOGY W/REFLEX: Lyme Total Antibody EIA: NEGATIVE

## 2023-11-19 ENCOUNTER — Ambulatory Visit: Payer: Self-pay | Admitting: Internal Medicine

## 2023-11-19 ENCOUNTER — Encounter: Payer: Self-pay | Admitting: Internal Medicine

## 2023-11-19 NOTE — Assessment & Plan Note (Signed)
 Previously managed with atorvastatin  which she suspended  a month ago due to sudden onset of abdominal pain .  Advised to resume   Lab Results  Component Value Date   CHOL 186 11/17/2023   HDL 54 11/17/2023   LDLCALC 100 (H) 11/17/2023   LDLDIRECT 103.0 02/14/2023   TRIG 207 (H) 11/17/2023   CHOLHDL 3.4 11/17/2023

## 2023-11-22 MED ORDER — ATORVASTATIN CALCIUM 20 MG PO TABS: 20.0000 mg | ORAL_TABLET | Freq: Every day | ORAL | 1 refills | Status: DC

## 2024-01-11 ENCOUNTER — Other Ambulatory Visit: Payer: Self-pay | Admitting: Internal Medicine

## 2024-01-11 DIAGNOSIS — Z1231 Encounter for screening mammogram for malignant neoplasm of breast: Secondary | ICD-10-CM

## 2024-02-22 ENCOUNTER — Other Ambulatory Visit: Payer: Self-pay | Admitting: Internal Medicine

## 2024-03-01 ENCOUNTER — Ambulatory Visit
Admission: RE | Admit: 2024-03-01 | Discharge: 2024-03-01 | Disposition: A | Discharge status: Home / Self Care | Source: Ambulatory Visit | Attending: Internal Medicine | Admitting: Internal Medicine

## 2024-03-01 DIAGNOSIS — Z1231 Encounter for screening mammogram for malignant neoplasm of breast: Secondary | ICD-10-CM | POA: Diagnosis present

## 2024-05-05 ENCOUNTER — Other Ambulatory Visit: Payer: Self-pay | Admitting: Internal Medicine

## 2024-05-15 ENCOUNTER — Other Ambulatory Visit

## 2024-05-15 DIAGNOSIS — E1121 Type 2 diabetes mellitus with diabetic nephropathy: Secondary | ICD-10-CM | POA: Diagnosis not present

## 2024-05-15 DIAGNOSIS — E1169 Type 2 diabetes mellitus with other specified complication: Secondary | ICD-10-CM

## 2024-05-15 DIAGNOSIS — E785 Hyperlipidemia, unspecified: Secondary | ICD-10-CM | POA: Diagnosis not present

## 2024-05-15 LAB — COMPREHENSIVE METABOLIC PANEL WITH GFR
ALT: 18 U/L (ref 3–35)
AST: 18 U/L (ref 5–37)
Albumin: 4.4 g/dL (ref 3.5–5.2)
Alkaline Phosphatase: 85 U/L (ref 39–117)
BUN: 19 mg/dL (ref 6–23)
CO2: 27 meq/L (ref 19–32)
Calcium: 9.4 mg/dL (ref 8.4–10.5)
Chloride: 101 meq/L (ref 96–112)
Creatinine, Ser: 0.9 mg/dL (ref 0.40–1.20)
GFR: 67.9 mL/min
Glucose, Bld: 114 mg/dL — ABNORMAL HIGH (ref 70–99)
Potassium: 4.3 meq/L (ref 3.5–5.1)
Sodium: 138 meq/L (ref 135–145)
Total Bilirubin: 0.5 mg/dL (ref 0.2–1.2)
Total Protein: 6.7 g/dL (ref 6.0–8.3)

## 2024-05-15 LAB — HEMOGLOBIN A1C: Hgb A1c MFr Bld: 6.5 % (ref 4.6–6.5)

## 2024-05-15 LAB — LDL CHOLESTEROL, DIRECT: Direct LDL: 61 mg/dL

## 2024-05-16 LAB — LIPID PANEL
Cholesterol: 118 mg/dL
HDL: 49 mg/dL — ABNORMAL LOW
LDL Cholesterol (Calc): 50 mg/dL
Non-HDL Cholesterol (Calc): 69 mg/dL
Total CHOL/HDL Ratio: 2.4 (calc)
Triglycerides: 103 mg/dL

## 2024-05-17 ENCOUNTER — Encounter: Payer: Self-pay | Admitting: Internal Medicine

## 2024-05-17 ENCOUNTER — Ambulatory Visit: Admitting: Internal Medicine

## 2024-05-17 VITALS — BP 110/66 | HR 83 | Ht 63.0 in | Wt 196.6 lb

## 2024-05-17 DIAGNOSIS — E1121 Type 2 diabetes mellitus with diabetic nephropathy: Secondary | ICD-10-CM | POA: Diagnosis not present

## 2024-05-17 DIAGNOSIS — E785 Hyperlipidemia, unspecified: Secondary | ICD-10-CM

## 2024-05-17 DIAGNOSIS — E669 Obesity, unspecified: Secondary | ICD-10-CM | POA: Diagnosis not present

## 2024-05-17 DIAGNOSIS — E1169 Type 2 diabetes mellitus with other specified complication: Secondary | ICD-10-CM | POA: Diagnosis not present

## 2024-05-17 DIAGNOSIS — Z6834 Body mass index (BMI) 34.0-34.9, adult: Secondary | ICD-10-CM | POA: Diagnosis not present

## 2024-05-17 MED ORDER — ATORVASTATIN CALCIUM 20 MG PO TABS: 20.0000 mg | ORAL_TABLET | Freq: Every day | ORAL | 1 refills | Status: AC

## 2024-05-17 NOTE — Patient Instructions (Addendum)
 Thanks for the tip on [employer] [facility] [employer]   Today we discussed a weight loss  medication called Contrave that is effective for helping  emotional eaters  lose or maintain previous weight loss. It is available for $100/month through [employer]  (patient support program by [employer] )  and will come from their mail order pharmacy .  If you are interested in starting this medication,  scan the qr code on the handout,  which will take you to their website to set up your account.     Contrave contains wellbutrin and naltrexone. The dose is increased gradually over the first month to avoid side effects.    Once you have set up the account, send me an e mail and I will send the rx to the mail order pharmacy

## 2024-05-17 NOTE — Progress Notes (Unsigned)
 "  Subjective:  Patient ID: Nichole Jensen, female    DOB: 1961/01/21  Age: 64 y.o. MRN: [id number]  CC: There were no encounter diagnoses.   HPI [facility] presents for  Chief Complaint  Patient presents with   Medical Management of Chronic Issues    6 month follow up    1) obesity, DM,HTN:     Outpatient Medications Prior to Visit  Medication Sig Dispense Refill   ALPRAZolam  (XANAX ) 0.25 MG tablet Take 1 tablet (0.25 mg total) by mouth at bedtime as needed for anxiety. 20 tablet 02   atorvastatin  (LIPITOR) 20 MG tablet TAKE ONE TABLET BY MOUTH ONCE DAILY 90 tablet 1   doxycycline  (VIBRA -TABS) 100 MG tablet Take 1 tablet (100 mg total) by mouth 2 (two) times daily. 14 tablet 0   loratadine (CLARITIN) 10 MG tablet Take 10 mg by mouth daily.     Multiple Vitamins-Minerals (MULTIVITAMIN WITH MINERALS) tablet Take 1 tablet by mouth daily.     telmisartan -hydrochlorothiazide  (MICARDIS  HCT) 40-12.5 MG tablet TAKE ONE TABLET BY MOUTH ONCE DAILY 90 tablet 1   No facility-administered medications prior to visit.    Review of Systems;  Patient denies headache, fevers, malaise, unintentional weight loss, skin rash, eye pain, sinus congestion and sinus pain, sore throat, dysphagia,  hemoptysis , cough, dyspnea, wheezing, chest pain, palpitations, orthopnea, edema, abdominal pain, nausea, melena, diarrhea, constipation, flank pain, dysuria, hematuria, urinary  Frequency, nocturia, numbness, tingling, seizures,  Focal weakness, Loss of consciousness,  Tremor, insomnia, depression, anxiety, and suicidal ideation.      Objective:  BP 110/66   Pulse 83   Ht 5' 3 (1.6 m)   Wt 196 lb 9.6 oz (89.2 kg)   SpO2 97%   BMI 34.83 kg/m   BP Readings from Last 3 Encounters:  05/17/24 110/66  11/17/23 110/62  08/18/23 116/78    Wt Readings from Last 3 Encounters:  05/17/24 196 lb 9.6 oz (89.2 kg)  11/17/23 194 lb (88 kg)  08/18/23 189 lb 9.6 oz (86 kg)    Physical Exam  Lab  Results  Component Value Date   HGBA1C 6.5 05/15/2024   HGBA1C 6.5 11/17/2023   HGBA1C 6.5 08/10/2023    Lab Results  Component Value Date   CREATININE 0.90 05/15/2024   CREATININE 0.89 11/17/2023   CREATININE 0.90 08/10/2023    Lab Results  Component Value Date   WBC 7.3 02/14/2023   HGB 13.7 02/14/2023   HCT 42.3 02/14/2023   PLT 211.0 02/14/2023   GLUCOSE 114 (H) 05/15/2024   CHOL 118 05/15/2024   TRIG 103 05/15/2024   HDL 49 (L) 05/15/2024   LDLDIRECT 61.0 05/15/2024   LDLCALC 50 05/15/2024   ALT 18 05/15/2024   AST 18 05/15/2024   NA 138 05/15/2024   K 4.3 05/15/2024   CL 101 05/15/2024   CREATININE 0.90 05/15/2024   BUN 19 05/15/2024   CO2 27 05/15/2024   TSH 3.38 02/14/2023   HGBA1C 6.5 05/15/2024   MICROALBUR 1.7 08/10/2023    MM 3D SCREENING MAMMOGRAM BILATERAL BREAST Result Date: 03/05/2024 CLINICAL DATA:  Screening. EXAM: DIGITAL SCREENING BILATERAL MAMMOGRAM WITH TOMOSYNTHESIS AND CAD TECHNIQUE: Bilateral screening digital craniocaudal and mediolateral oblique mammograms were obtained. Bilateral screening digital breast tomosynthesis was performed. The images were evaluated with computer-aided detection. COMPARISON:  Previous exam(s). ACR Breast Density Category b: There are scattered areas of fibroglandular density. FINDINGS: There are no findings suspicious for malignancy. IMPRESSION: No mammographic evidence of malignancy.  A result letter of this screening mammogram will be mailed directly to the patient. RECOMMENDATION: Screening mammogram in one year. (Code:SM-B-01Y) BI-RADS CATEGORY  1: Negative. Electronically Signed   By: Corean Salter M.D.   On: 03/05/2024 10:18    Assessment & Plan:  .There are no diagnoses linked to this encounter.   I spent 34 minutes on the day of this face to face encounter reviewing patient's  most recent visit with cardiology,  nephrology,  and neurology,  prior relevant surgical and non surgical procedures, recent   labs and imaging studies, counseling on weight management,  reviewing the assessment and plan with patient, and post visit ordering and reviewing of  diagnostics and therapeutics with patient  .   Follow-up: No follow-ups on file.   Verneita LITTIE Kettering, MD "

## 2024-05-18 MED ORDER — CONTRAVE 8-90 MG PO TB12: 2.0000 | ORAL_TABLET | Freq: Two times a day (BID) | ORAL | 1 refills | Status: AC

## 2024-05-18 MED ORDER — CONTRAVE 8-90 MG PO TB12: ORAL_TABLET | ORAL | 0 refills | Status: AC

## 2024-05-18 NOTE — Assessment & Plan Note (Signed)
"   LDL is at goal since resuming atorvastatin .  No changes today  Lab Results  Component Value Date   CHOL 118 05/15/2024   HDL 49 (L) 05/15/2024   LDLCALC 50 05/15/2024   LDLDIRECT 61.0 05/15/2024   TRIG 103 05/15/2024   CHOLHDL 2.4 05/15/2024    "

## 2024-05-18 NOTE — Assessment & Plan Note (Signed)
 Diagnosed in August 2022 with A1c of 6.5  she has been well controlled on diet alone but  has BMI >30 . Renal function stable, no changes today. She suspended statin due to abd pain which was determined to be diet related and has resumed  statin therapy  for goal  LDL of <  70 and continuing ARB for management of microalbuminuria which is decreasing by current surveillance labs.  GLP 1 agonist therapy discussed but deferred due to FH of thyroid  CA of uncertain etiology.   Lab Results  Component Value Date   HGBA1C 6.5 05/15/2024   Lab Results  Component Value Date   MICROALBUR 1.7 08/10/2023   MICROALBUR 44.0 (H) 10/07/2017

## 2024-05-18 NOTE — Assessment & Plan Note (Signed)
 Today we discussed a weight loss  medication called Contrave that is effective for helping  emotional eaters  lose or maintain previous weight loss. She interested in starting this medication and has set up an account with the mail order pharmacy

## 2024-08-15 ENCOUNTER — Ambulatory Visit: Admitting: Internal Medicine
